# Patient Record
Sex: Female | Born: 1935
Health system: Southern US, Community
[De-identification: ages and names within clinical notes are randomized; demographics above are authoritative.]

## PROBLEM LIST (undated history)

## (undated) DIAGNOSIS — I7300 Raynaud's syndrome without gangrene: Secondary | ICD-10-CM

## (undated) DIAGNOSIS — K219 Gastro-esophageal reflux disease without esophagitis: Secondary | ICD-10-CM

## (undated) DIAGNOSIS — J309 Allergic rhinitis, unspecified: Secondary | ICD-10-CM

## (undated) DIAGNOSIS — N393 Stress incontinence (female) (male): Secondary | ICD-10-CM

## (undated) DIAGNOSIS — M810 Age-related osteoporosis without current pathological fracture: Secondary | ICD-10-CM

## (undated) DIAGNOSIS — N6009 Solitary cyst of unspecified breast: Secondary | ICD-10-CM

## (undated) DIAGNOSIS — E785 Hyperlipidemia, unspecified: Secondary | ICD-10-CM

## (undated) DIAGNOSIS — K648 Other hemorrhoids: Secondary | ICD-10-CM

## (undated) DIAGNOSIS — I00 Rheumatic fever without heart involvement: Secondary | ICD-10-CM

## (undated) DIAGNOSIS — K5730 Diverticulosis of large intestine without perforation or abscess without bleeding: Secondary | ICD-10-CM

## (undated) DIAGNOSIS — K5900 Constipation, unspecified: Secondary | ICD-10-CM

## (undated) HISTORY — DX: Solitary cyst of unspecified breast: N60.09

## (undated) HISTORY — DX: Allergic rhinitis, unspecified: J30.9

## (undated) HISTORY — DX: Hyperlipidemia, unspecified: E78.5

## (undated) HISTORY — PX: TONSILLECTOMY: SHX5217

## (undated) HISTORY — DX: Gastro-esophageal reflux disease without esophagitis: K21.9

## (undated) HISTORY — DX: Raynaud's syndrome without gangrene: I73.00

## (undated) HISTORY — DX: Stress incontinence (female) (male): N39.3

## (undated) HISTORY — PX: TUBAL LIGATION: SHX77

## (undated) HISTORY — PX: SHOULDER SURGERY: SHX246

## (undated) HISTORY — DX: Constipation, unspecified: K59.00

## (undated) HISTORY — DX: Diverticulosis of large intestine without perforation or abscess without bleeding: K57.30

## (undated) HISTORY — PX: BLADDER REPAIR: SHX76

## (undated) HISTORY — PX: TONSILLECTOMY: SUR1361

## (undated) HISTORY — PX: KNEE ARTHROSCOPY: SUR90

## (undated) HISTORY — PX: ABDOMINAL HYSTERECTOMY: SHX81

## (undated) HISTORY — DX: Age-related osteoporosis without current pathological fracture: M81.0

## (undated) HISTORY — DX: Other hemorrhoids: K64.8

## (undated) HISTORY — DX: Rheumatic fever without heart involvement: I00

---

## 1998-03-10 HISTORY — PX: BREAST BIOPSY: SHX20

## 2001-03-10 HISTORY — PX: SHOULDER ARTHROSCOPY WITH ROTATOR CUFF REPAIR AND SUBACROMIAL DECOMPRESSION: SHX5686

## 2001-08-08 HISTORY — PX: SHOULDER ARTHROSCOPY WITH ROTATOR CUFF REPAIR AND SUBACROMIAL DECOMPRESSION: SHX5686

## 2004-03-10 HISTORY — PX: ROTATOR CUFF REPAIR: SHX139

## 2004-05-23 ENCOUNTER — Ambulatory Visit: Payer: Self-pay | Admitting: Family Medicine

## 2004-05-24 ENCOUNTER — Ambulatory Visit (HOSPITAL_COMMUNITY): Admission: RE | Admit: 2004-05-24 | Discharge: 2004-05-25 | Payer: Self-pay | Admitting: Orthopedic Surgery

## 2004-06-20 ENCOUNTER — Encounter: Payer: Self-pay | Admitting: Orthopedic Surgery

## 2004-07-08 ENCOUNTER — Encounter: Payer: Self-pay | Admitting: Orthopedic Surgery

## 2004-08-08 ENCOUNTER — Encounter: Payer: Self-pay | Admitting: Orthopedic Surgery

## 2004-10-08 LAB — HM DEXA SCAN

## 2005-03-27 ENCOUNTER — Ambulatory Visit: Payer: Self-pay | Admitting: Gastroenterology

## 2005-07-02 ENCOUNTER — Ambulatory Visit: Payer: Self-pay | Admitting: Family Medicine

## 2006-02-26 ENCOUNTER — Ambulatory Visit: Payer: Self-pay | Admitting: Family Medicine

## 2006-04-23 ENCOUNTER — Ambulatory Visit: Payer: Self-pay | Admitting: Family Medicine

## 2006-04-23 LAB — CONVERTED CEMR LAB
ALT: 19 units/L (ref 0–40)
AST: 23 units/L (ref 0–37)
Direct LDL: 161.3 mg/dL
Total CHOL/HDL Ratio: 3.4
VLDL: 15 mg/dL (ref 0–40)

## 2006-06-01 ENCOUNTER — Ambulatory Visit: Payer: Self-pay | Admitting: Family Medicine

## 2006-07-07 ENCOUNTER — Ambulatory Visit: Payer: Self-pay | Admitting: Family Medicine

## 2006-07-09 ENCOUNTER — Ambulatory Visit: Payer: Self-pay | Admitting: Family Medicine

## 2006-07-09 DIAGNOSIS — E785 Hyperlipidemia, unspecified: Secondary | ICD-10-CM

## 2006-07-09 DIAGNOSIS — J301 Allergic rhinitis due to pollen: Secondary | ICD-10-CM

## 2006-07-10 ENCOUNTER — Telehealth (INDEPENDENT_AMBULATORY_CARE_PROVIDER_SITE_OTHER): Payer: Self-pay | Admitting: *Deleted

## 2006-07-13 ENCOUNTER — Encounter: Payer: Self-pay | Admitting: Family Medicine

## 2006-07-15 ENCOUNTER — Encounter: Payer: Self-pay | Admitting: Family Medicine

## 2006-07-27 ENCOUNTER — Ambulatory Visit: Payer: Self-pay | Admitting: Family Medicine

## 2006-07-27 LAB — CONVERTED CEMR LAB: Direct LDL: 129.4 mg/dL

## 2006-07-28 ENCOUNTER — Ambulatory Visit: Payer: Self-pay | Admitting: Family Medicine

## 2006-07-30 ENCOUNTER — Telehealth: Payer: Self-pay | Admitting: Family Medicine

## 2006-09-10 ENCOUNTER — Encounter: Payer: Self-pay | Admitting: Family Medicine

## 2006-10-26 ENCOUNTER — Telehealth: Payer: Self-pay | Admitting: Family Medicine

## 2006-10-30 ENCOUNTER — Encounter (INDEPENDENT_AMBULATORY_CARE_PROVIDER_SITE_OTHER): Payer: Self-pay | Admitting: *Deleted

## 2006-12-29 ENCOUNTER — Encounter: Payer: Self-pay | Admitting: Family Medicine

## 2007-01-21 ENCOUNTER — Ambulatory Visit: Payer: Self-pay | Admitting: Family Medicine

## 2007-01-27 ENCOUNTER — Encounter: Payer: Self-pay | Admitting: Family Medicine

## 2007-01-27 ENCOUNTER — Ambulatory Visit: Payer: Self-pay

## 2007-01-28 ENCOUNTER — Ambulatory Visit: Payer: Self-pay | Admitting: Family Medicine

## 2007-01-28 LAB — CONVERTED CEMR LAB
Calcium: 9.5 mg/dL (ref 8.4–10.5)
Creatinine, Ser: 0.7 mg/dL (ref 0.4–1.2)
Glucose, Bld: 71 mg/dL (ref 70–99)
Sodium: 138 meq/L (ref 135–145)

## 2007-01-29 ENCOUNTER — Telehealth: Payer: Self-pay | Admitting: Family Medicine

## 2007-02-11 ENCOUNTER — Ambulatory Visit: Payer: Self-pay | Admitting: Family Medicine

## 2007-02-11 DIAGNOSIS — K219 Gastro-esophageal reflux disease without esophagitis: Secondary | ICD-10-CM

## 2007-02-19 ENCOUNTER — Telehealth: Payer: Self-pay | Admitting: Family Medicine

## 2007-03-25 ENCOUNTER — Telehealth: Payer: Self-pay | Admitting: Family Medicine

## 2007-06-07 ENCOUNTER — Ambulatory Visit: Payer: Self-pay | Admitting: Family Medicine

## 2007-06-07 DIAGNOSIS — K648 Other hemorrhoids: Secondary | ICD-10-CM | POA: Insufficient documentation

## 2007-06-07 DIAGNOSIS — K5909 Other constipation: Secondary | ICD-10-CM

## 2007-06-11 ENCOUNTER — Telehealth: Payer: Self-pay | Admitting: Family Medicine

## 2007-06-17 ENCOUNTER — Other Ambulatory Visit: Admission: RE | Admit: 2007-06-17 | Discharge: 2007-06-17 | Payer: Self-pay | Admitting: Family Medicine

## 2007-06-17 ENCOUNTER — Ambulatory Visit: Payer: Self-pay | Admitting: Family Medicine

## 2007-06-21 LAB — CONVERTED CEMR LAB
AST: 18 units/L (ref 0–37)
Albumin: 4.3 g/dL (ref 3.5–5.2)
Alkaline Phosphatase: 58 units/L (ref 39–117)
BUN: 13 mg/dL (ref 6–23)
Calcium: 9.4 mg/dL (ref 8.4–10.5)
Chloride: 100 meq/L (ref 96–112)
Cholesterol: 238 mg/dL — ABNORMAL HIGH (ref 0–200)
Creatinine, Ser: 0.6 mg/dL (ref 0.40–1.20)
LDL Cholesterol: 152 mg/dL — ABNORMAL HIGH (ref 0–99)
Total CHOL/HDL Ratio: 3.3

## 2007-06-22 ENCOUNTER — Encounter: Payer: Self-pay | Admitting: Family Medicine

## 2007-06-24 ENCOUNTER — Encounter: Payer: Self-pay | Admitting: Family Medicine

## 2007-06-24 DIAGNOSIS — R011 Cardiac murmur, unspecified: Secondary | ICD-10-CM

## 2007-06-24 DIAGNOSIS — I7300 Raynaud's syndrome without gangrene: Secondary | ICD-10-CM

## 2007-06-24 DIAGNOSIS — M810 Age-related osteoporosis without current pathological fracture: Secondary | ICD-10-CM | POA: Insufficient documentation

## 2007-06-24 DIAGNOSIS — B351 Tinea unguium: Secondary | ICD-10-CM | POA: Insufficient documentation

## 2007-06-24 DIAGNOSIS — K5730 Diverticulosis of large intestine without perforation or abscess without bleeding: Secondary | ICD-10-CM | POA: Insufficient documentation

## 2007-07-19 ENCOUNTER — Ambulatory Visit: Payer: Self-pay | Admitting: Gynecology

## 2007-08-12 ENCOUNTER — Encounter: Payer: Self-pay | Admitting: Family Medicine

## 2007-08-17 ENCOUNTER — Encounter: Payer: Self-pay | Admitting: Family Medicine

## 2007-08-18 ENCOUNTER — Ambulatory Visit: Payer: Self-pay | Admitting: Gynecology

## 2007-08-18 ENCOUNTER — Ambulatory Visit (HOSPITAL_COMMUNITY): Admission: RE | Admit: 2007-08-18 | Discharge: 2007-08-19 | Payer: Self-pay | Admitting: Gynecology

## 2007-08-23 ENCOUNTER — Ambulatory Visit: Payer: Self-pay | Admitting: Gynecology

## 2007-09-29 ENCOUNTER — Ambulatory Visit: Payer: Self-pay | Admitting: Gynecology

## 2007-10-06 ENCOUNTER — Encounter: Admission: RE | Admit: 2007-10-06 | Discharge: 2007-10-06 | Payer: Self-pay | Admitting: Gynecology

## 2007-10-08 ENCOUNTER — Encounter: Admission: RE | Admit: 2007-10-08 | Discharge: 2007-10-08 | Payer: Self-pay | Admitting: Gynecology

## 2007-10-14 ENCOUNTER — Encounter: Admission: RE | Admit: 2007-10-14 | Discharge: 2007-10-14 | Payer: Self-pay | Admitting: Gynecology

## 2007-10-21 ENCOUNTER — Encounter: Admission: RE | Admit: 2007-10-21 | Discharge: 2007-10-21 | Payer: Self-pay | Admitting: Gynecology

## 2007-10-21 ENCOUNTER — Encounter (INDEPENDENT_AMBULATORY_CARE_PROVIDER_SITE_OTHER): Payer: Self-pay | Admitting: Diagnostic Radiology

## 2007-12-01 ENCOUNTER — Telehealth: Payer: Self-pay | Admitting: Family Medicine

## 2007-12-01 DIAGNOSIS — I8390 Asymptomatic varicose veins of unspecified lower extremity: Secondary | ICD-10-CM

## 2008-03-23 ENCOUNTER — Telehealth: Payer: Self-pay | Admitting: Family Medicine

## 2008-03-23 DIAGNOSIS — R928 Other abnormal and inconclusive findings on diagnostic imaging of breast: Secondary | ICD-10-CM | POA: Insufficient documentation

## 2008-03-27 ENCOUNTER — Ambulatory Visit: Payer: Self-pay | Admitting: Family Medicine

## 2008-03-29 LAB — CONVERTED CEMR LAB
Alkaline Phosphatase: 51 units/L (ref 39–117)
BUN: 13 mg/dL (ref 6–23)
Basophils Relative: 0.1 % (ref 0.0–3.0)
Bilirubin, Direct: 0.1 mg/dL (ref 0.0–0.3)
CO2: 32 meq/L (ref 19–32)
Calcium: 9.9 mg/dL (ref 8.4–10.5)
Eosinophils Relative: 1.8 % (ref 0.0–5.0)
GFR calc Af Amer: 106 mL/min
GFR calc non Af Amer: 88 mL/min
HCT: 37.4 % (ref 36.0–46.0)
Lymphocytes Relative: 46.5 % — ABNORMAL HIGH (ref 12.0–46.0)
MCV: 89.6 fL (ref 78.0–100.0)
Monocytes Relative: 9.4 % (ref 3.0–12.0)
Neutrophils Relative %: 42.2 % — ABNORMAL LOW (ref 43.0–77.0)
Sodium: 140 meq/L (ref 135–145)
Total Bilirubin: 0.9 mg/dL (ref 0.3–1.2)
WBC: 5.7 10*3/uL (ref 4.5–10.5)

## 2008-04-19 ENCOUNTER — Ambulatory Visit: Payer: Self-pay | Admitting: Family Medicine

## 2008-04-27 ENCOUNTER — Encounter: Admission: RE | Admit: 2008-04-27 | Discharge: 2008-04-27 | Payer: Self-pay | Admitting: Family Medicine

## 2008-05-04 ENCOUNTER — Encounter (INDEPENDENT_AMBULATORY_CARE_PROVIDER_SITE_OTHER): Payer: Self-pay | Admitting: *Deleted

## 2008-05-09 ENCOUNTER — Ambulatory Visit: Payer: Self-pay | Admitting: Family Medicine

## 2008-05-12 ENCOUNTER — Encounter: Payer: Self-pay | Admitting: Family Medicine

## 2008-05-12 ENCOUNTER — Ambulatory Visit: Payer: Self-pay | Admitting: Internal Medicine

## 2008-05-12 ENCOUNTER — Ambulatory Visit: Payer: Self-pay

## 2008-05-16 ENCOUNTER — Telehealth: Payer: Self-pay | Admitting: Family Medicine

## 2008-05-22 ENCOUNTER — Telehealth: Payer: Self-pay | Admitting: Family Medicine

## 2008-06-22 ENCOUNTER — Ambulatory Visit: Payer: Self-pay | Admitting: Family Medicine

## 2008-06-28 ENCOUNTER — Ambulatory Visit: Payer: Self-pay | Admitting: Family Medicine

## 2008-06-28 LAB — CONVERTED CEMR LAB
AST: 22 units/L (ref 0–37)
Albumin: 3.9 g/dL (ref 3.5–5.2)
Bilirubin, Direct: 0.1 mg/dL (ref 0.0–0.3)
Calcium: 9.5 mg/dL (ref 8.4–10.5)
Creatinine, Ser: 0.7 mg/dL (ref 0.4–1.2)
HDL: 81.6 mg/dL (ref >39.00)
Sodium: 139 meq/L (ref 135–145)
Total Bilirubin: 0.7 mg/dL (ref 0.3–1.2)
Total CHOL/HDL Ratio: 3
Total Protein: 6.8 g/dL (ref 6.0–8.3)
Triglycerides: 38 mg/dL (ref 0.0–149.0)

## 2008-08-04 ENCOUNTER — Telehealth: Payer: Self-pay | Admitting: Family Medicine

## 2008-09-05 ENCOUNTER — Telehealth: Payer: Self-pay | Admitting: Family Medicine

## 2008-09-18 ENCOUNTER — Ambulatory Visit: Payer: Self-pay | Admitting: Family Medicine

## 2008-09-18 LAB — CONVERTED CEMR LAB
ALT: 21 units/L (ref 0–35)
AST: 24 units/L (ref 0–37)
Albumin: 3.8 g/dL (ref 3.5–5.2)
BUN: 14 mg/dL (ref 6–23)
Bilirubin, Direct: 0.1 mg/dL (ref 0.0–0.3)
CO2: 30 meq/L (ref 19–32)
Chloride: 104 meq/L (ref 96–112)
Creatinine, Ser: 0.6 mg/dL (ref 0.4–1.2)
HDL: 80.5 mg/dL (ref >39.00)
Potassium: 4.5 meq/L (ref 3.5–5.1)
Total CHOL/HDL Ratio: 2

## 2008-10-06 ENCOUNTER — Encounter: Admission: RE | Admit: 2008-10-06 | Discharge: 2008-10-06 | Payer: Self-pay | Admitting: Family Medicine

## 2008-12-07 ENCOUNTER — Ambulatory Visit: Payer: Self-pay | Admitting: Physician Assistant

## 2008-12-19 ENCOUNTER — Ambulatory Visit: Payer: Self-pay | Admitting: Pain Medicine

## 2008-12-29 ENCOUNTER — Ambulatory Visit: Payer: Self-pay | Admitting: Family Medicine

## 2009-01-09 ENCOUNTER — Ambulatory Visit: Payer: Self-pay | Admitting: Family Medicine

## 2009-01-11 ENCOUNTER — Ambulatory Visit: Payer: Self-pay | Admitting: Physician Assistant

## 2009-01-16 ENCOUNTER — Ambulatory Visit: Payer: Self-pay | Admitting: Family Medicine

## 2009-01-24 ENCOUNTER — Telehealth (INDEPENDENT_AMBULATORY_CARE_PROVIDER_SITE_OTHER): Payer: Self-pay | Admitting: *Deleted

## 2009-01-30 ENCOUNTER — Ambulatory Visit: Payer: Self-pay | Admitting: Pain Medicine

## 2009-02-12 ENCOUNTER — Telehealth: Payer: Self-pay | Admitting: Family Medicine

## 2009-02-15 ENCOUNTER — Ambulatory Visit: Payer: Self-pay | Admitting: Physician Assistant

## 2009-02-21 ENCOUNTER — Telehealth: Payer: Self-pay | Admitting: Internal Medicine

## 2009-03-13 ENCOUNTER — Ambulatory Visit: Payer: Self-pay | Admitting: Pain Medicine

## 2009-03-23 ENCOUNTER — Telehealth (INDEPENDENT_AMBULATORY_CARE_PROVIDER_SITE_OTHER): Payer: Self-pay | Admitting: *Deleted

## 2009-03-26 ENCOUNTER — Ambulatory Visit: Payer: Self-pay | Admitting: Internal Medicine

## 2009-04-02 ENCOUNTER — Ambulatory Visit: Payer: Self-pay | Admitting: Pain Medicine

## 2009-04-03 ENCOUNTER — Encounter: Payer: Self-pay | Admitting: Internal Medicine

## 2009-04-12 ENCOUNTER — Ambulatory Visit: Payer: Self-pay | Admitting: Family Medicine

## 2009-04-23 ENCOUNTER — Ambulatory Visit: Payer: Self-pay | Admitting: Urology

## 2009-04-23 ENCOUNTER — Encounter: Payer: Self-pay | Admitting: Internal Medicine

## 2009-04-24 ENCOUNTER — Ambulatory Visit: Payer: Self-pay | Admitting: Pain Medicine

## 2009-05-17 ENCOUNTER — Encounter: Payer: Self-pay | Admitting: Family Medicine

## 2009-05-18 ENCOUNTER — Encounter: Payer: Self-pay | Admitting: Family Medicine

## 2009-05-18 ENCOUNTER — Ambulatory Visit: Payer: Self-pay

## 2009-05-21 ENCOUNTER — Ambulatory Visit: Payer: Self-pay | Admitting: Pain Medicine

## 2009-07-27 ENCOUNTER — Ambulatory Visit: Payer: Self-pay | Admitting: Internal Medicine

## 2009-07-31 LAB — CONVERTED CEMR LAB
Basophils Absolute: 0 10*3/uL (ref 0.0–0.1)
Basophils Relative: 0.5 % (ref 0.0–3.0)
Calcium: 9.7 mg/dL (ref 8.4–10.5)
Chloride: 100 meq/L (ref 96–112)
Creatinine, Ser: 0.5 mg/dL (ref 0.4–1.2)
Eosinophils Relative: 1.1 % (ref 0.0–5.0)
GFR calc non Af Amer: 145.58 mL/min (ref >60)
Glucose, Bld: 91 mg/dL (ref 70–99)
Lymphocytes Relative: 38.8 % (ref 12.0–46.0)
MCV: 90.8 fL (ref 78.0–100.0)
Monocytes Relative: 8.9 % (ref 3.0–12.0)
Neutro Abs: 3.2 10*3/uL (ref 1.4–7.7)
Neutrophils Relative %: 50.7 % (ref 43.0–77.0)
Platelets: 239 10*3/uL (ref 150.0–400.0)
RBC: 4.34 M/uL (ref 3.87–5.11)
Sodium: 140 meq/L (ref 135–145)
WBC: 6.2 10*3/uL (ref 4.5–10.5)

## 2009-08-13 ENCOUNTER — Encounter: Payer: Self-pay | Admitting: Internal Medicine

## 2009-09-14 ENCOUNTER — Telehealth: Payer: Self-pay | Admitting: Internal Medicine

## 2009-10-08 ENCOUNTER — Encounter: Admission: RE | Admit: 2009-10-08 | Discharge: 2009-10-08 | Payer: Self-pay | Admitting: Internal Medicine

## 2009-10-11 ENCOUNTER — Encounter (INDEPENDENT_AMBULATORY_CARE_PROVIDER_SITE_OTHER): Payer: Self-pay | Admitting: *Deleted

## 2009-11-08 ENCOUNTER — Ambulatory Visit: Payer: Self-pay | Admitting: Family Medicine

## 2009-11-14 ENCOUNTER — Encounter: Payer: Self-pay | Admitting: Family Medicine

## 2010-01-08 ENCOUNTER — Ambulatory Visit: Payer: Self-pay | Admitting: Internal Medicine

## 2010-01-28 ENCOUNTER — Ambulatory Visit: Payer: Self-pay | Admitting: Internal Medicine

## 2010-02-11 ENCOUNTER — Ambulatory Visit: Payer: Self-pay | Admitting: Family Medicine

## 2010-02-11 DIAGNOSIS — M76899 Other specified enthesopathies of unspecified lower limb, excluding foot: Secondary | ICD-10-CM

## 2010-02-11 DIAGNOSIS — M2140 Flat foot [pes planus] (acquired), unspecified foot: Secondary | ICD-10-CM | POA: Insufficient documentation

## 2010-02-11 DIAGNOSIS — M2170 Unequal limb length (acquired), unspecified site: Secondary | ICD-10-CM

## 2010-02-11 DIAGNOSIS — R269 Unspecified abnormalities of gait and mobility: Secondary | ICD-10-CM | POA: Insufficient documentation

## 2010-03-07 ENCOUNTER — Telehealth: Payer: Self-pay | Admitting: Family Medicine

## 2010-04-07 LAB — CONVERTED CEMR LAB
BUN: 12 mg/dL (ref 6–23)
CO2: 30 meq/L (ref 19–32)
Calcium: 9.7 mg/dL (ref 8.4–10.5)
Chloride: 98 meq/L (ref 96–112)
Creatinine, Ser: 0.6 mg/dL (ref 0.4–1.2)
Folate: >20 ng/mL
GFR calc non Af Amer: 105 mL/min

## 2010-04-09 NOTE — Progress Notes (Signed)
Summary: mammogram  Phone Note Call from Patient   Caller: breast [phone]  ext [phone] leave on voice mail Call For: Cindee Salt MD Summary of Call: Patient needs MRI pre-last mammogram record. They need order for this or do you want to wait until patient has mammogram first Initial call taken by: Benny Lennert CMA Duncan Dull),  September 14, 2009 3:24 PM  Follow-up for Phone Call        she has had stable MRI twice so I am not convinced she needs another breast MRI  I would recommend a regular mammogram and then MRI only if abnormality I would also recommend surgeon reevaluation for any abnormalities  Follow-up by: Cindee Salt MD,  September 14, 2009 5:17 PM  Additional Follow-up for Phone Call Additional follow up Details #1::        Spoke to [facility] patient has a screening MMG on 10/08/2009 left message with Olegario Messier what your recommendations were and that you would wait for MMG results. Also called the patient to let her know that you would wait for MMG results from the breast center.  Additional Follow-up by: Carlton Adam,  September 21, 2009 10:58 AM    Additional Follow-up for Phone Call Additional follow up Details #2::    okay will await mammo results Follow-up by: Cindee Salt MD,  September 22, 2009 7:28 PM   Appended Document: mammogram Olegario Messier from [facility] called and wanted to know if Dr. Alphonsus Sias wanted patient to have another MRI.  Advised her of Dr. Karle Starch response on 09/14/2009.  She says she will just contact the patient and if she has any further questions she will have her contact our office.  Appended Document: mammogram noted

## 2010-04-09 NOTE — Letter (Signed)
Summary: Results Follow up Letter  [place] [facility]  [address] [place], [place] [address]   Phone: [phone]  Fax: [phone]    10/11/2009 MRN: [id number]  [facility] [address] [place], [place]  [address]  Dear Ms. Lorio,  The following are the results of your recent test(s):  Test         Result    Pap Smear:        Normal _____  Not Normal _____ Comments: ______________________________________________________ Cholesterol: LDL(Bad cholesterol):         Your goal is less than:         HDL (Good cholesterol):       Your goal is more than: Comments:  ______________________________________________________ Mammogram:        Normal __X___  Not Normal _____ Comments: Will repeat in 1-2 years  ___________________________________________________________________ Hemoccult:        Normal _____  Not normal _______ Comments:    _____________________________________________________________________ Other Tests:    We routinely do not discuss normal results over the telephone.  If you desire a copy of the results, or you have any questions about this information we can discuss them at your next office visit.   Sincerely,     Tillman Abide, MD

## 2010-04-09 NOTE — Assessment & Plan Note (Signed)
Summary: ROA FOR 6 MONTH FOLLOW-UP/JRR   Vital Signs:  Patient profile:   75 year old female Weight:      154 pounds Temp:     98.0 degrees F oral Pulse rate:   72 / minute Pulse rhythm:   regular BP sitting:   120 / 80  (left arm) Cuff size:   regular  Vitals Entered By: Mervin Hack CMA Duncan Dull) (January 28, 2010 10:12 AM) CC: 6 month follow-up   History of Present Illness: Still has "lumps" in throat Voice was different at first--now back to her normal Feels phlegm in there and not going away  Swallowing difficulty has resolved now still feels phlegm Only occ heartburn issues  Not sure if she might have post nasal drip Did get immunotherapy in [place] but no recent meds Now here 6 years  Still gets cramps in the arch of her foot sometimes has to get out of car after getting in  [place] a times--wears sneakers. Depends on the weather  She feels her left leg is "very heavy" has to stop some exercises due to pain along lateral left leg---like leg lifts when lying down  Allergies: No Known Drug Allergies  Past History:  Past medical, surgical, family and social histories (including risk factors) reviewed for relevance to current acute and chronic problems.  Past Medical History: Reviewed history from 07/27/2009 and no changes required. RAYNAUD'S DISEASE (ICD-443.0) DERMATOPHYTOSIS OF NAIL (ICD-110.1) CARDIAC MURMUR (ICD-785.2) OSTEOPOROSIS (ICD-733.00) DIVERTICULOSIS, COLON (ICD-562.10) URINARY INCONTINENCE, STRESS, FEMALE (ICD-625.6) HEMORRHOIDS, INTERNAL (ICD-455.0) CONSTIPATION (ICD-564.00) GERD (ICD-530.81) HYPERLIPIDEMIA (ICD-272.4) ALLERGIC RHINITIS (ICD-477.9)    Past Surgical History: Reviewed history from 06/24/2007 and no changes required. In the 1960s, bladder tuck. In 1973, hysterectomy, full.  Tonsillectomy.  In 1990s, breast biopsy for fibrocystic disease.  In 2006, bilaterally shoulder surgery for rotator cuff.  Colonoscopy,  normal.  She states that she thinks she had it in     2006.  01/2003  Stress Test (-) 10/2004    DEXA - Osteopenia on Actonel, improving  Family History: Reviewed history from 06/24/2007 and no changes required. Father: Died 63 COPD, melanoma Mother: Died 18, CAD, CABG, MI age 20, melanoma, high cholesterol, DM Siblings: 2 brothers (diverticulosis, DM)                   3 sisters (polio, diverticulosis, brain aneurysm CVA:  PGM NO MI < 26 Breast CA:  Sister and aunt  Social History: Reviewed history from 07/27/2009 and no changes required. Never Smoked Alcohol use-yes, rare, maybe one every 3 months Drug use-no Regular exercise-yes, walking daily, line dancing 3 x/week Marital Status: Married  Children: 4, one died at 64  - MI vs. CVA Occupation: Retired Airline pilot person Diet:  No fruit, (+) veggies, no FF  Has living will. Husband should make health care decisions as needed, then son Molly Maduro. Would accept resuscitation but no prolonged ventilation. Would not want feeding tube if not cognitively aware  Review of Systems       still happy with the med for urinary incontinence Is still troubled with constipation---does try prune juice appetite is okay weight fairly stable Tosses and turns at night---dryingess in throat  Physical Exam  General:  alert and normal appearance.   Ears:  R ear normal and L ear normal.   Nose:  mild pale congestion without sig inflammation Mouth:  no erythema and no exudates.   Neck:  supple, no masses, no thyromegaly, and no cervical lymphadenopathy.  Lungs:  normal respiratory effort, no intercostal retractions, no accessory muscle use, normal breath sounds, no crackles, and no wheezes.   Heart:  normal rate, regular rhythm, no murmur, and no gallop.   Msk:  fairly normal arch on right----arch is gone on left Extremities:  no edema No calf or thigh tenderness Neurologic:  strength normal in all extremities and gait normal.   Psych:  normally  interactive, good eye contact, not anxious appearing, and not depressed appearing.     Impression & Recommendations:  Problem # 1:  GERD (ICD-530.81) Assessment Improved no swallowing problems now continue pantoprazole  Her updated medication list for this problem includes:    Pantoprazole Sodium 40 Mg Tbec (Pantoprazole sodium) .Marland Kitchen... 1 by mouth daily  Problem # 2:  ALLERGIC RHINITIS (ICD-477.9) Assessment: Deteriorated may be causing her sense of drainage  now will instruct on OTC meds to try  Problem # 3:  URINARY INCONTINENCE, STRESS, FEMALE (ICD-625.6) Assessment: Unchanged happy with the vesicare but explained that this can cause the dry mouth  Problem # 4:  HYPERLIPIDEMIA (ICD-272.4) Assessment: Unchanged no problems with med  Her updated medication list for this problem includes:    Simvastatin 40 Mg Tabs (Simvastatin) .Marland Kitchen... 1 tab by mouth daily  Labs Reviewed: SGOT: 24 (09/18/2008)   SGPT: 21 (09/18/2008)   HDL:80.50 (09/18/2008), 81.60 (06/22/2008)  LDL:92 (09/18/2008), 152 (45/40/9811)  Chol:183 (09/18/2008), 269 (06/22/2008)  Trig:51.0 (09/18/2008), 38.0 (06/22/2008)  Complete Medication List: 1)  Vesicare 5 Mg Tabs (Solifenacin succinate) .... Take 1 by mouth once daily 2)  Simvastatin 40 Mg Tabs (Simvastatin) .Marland Kitchen.. 1 tab by mouth daily 3)  Pantoprazole Sodium 40 Mg Tbec (Pantoprazole sodium) .Marland Kitchen.. 1 by mouth daily 4)  Caltrate 600+d Plus 600-400 Mg-unit Tabs (Calcium carbonate-vit d-min) .... Take 2 tablet by mouth twice a day 5)  Odorless Garlic 1250 Mg Tabs (Garlic) .Marland Kitchen.. 1 by mouth daily 6)  Fish Oil Maximum Strength 1200 Mg Caps (Omega-3 fatty acids) .... 2 by mouth daily 7)  Tgt Aspirin 81 Mg Tbec (Aspirin) .... Take 1 tablet by mouth once a day 8)  Icaps Caps (Multiple vitamins-minerals) .... Take 2 by mouth once daily  Patient Instructions: 1)  Please set up appt with Dr Patsy Lager to evaluate asymmetric arches and chronic foot spasms 2)  Please try miralax  -- 1 capful daily with water---for constipation 3)  Please try loratadine 10mg  1-2 daily or cetirizine 10mg  daily for allergies and drainage 4)  Please schedule a follow-up appointment in 6 months for physical    Orders Added: 1)  Est. Patient Level IV [91478]    Current Allergies (reviewed today): No known allergies

## 2010-04-09 NOTE — Miscellaneous (Signed)
Summary: Orders Update  Clinical Lists Changes  Orders: Added new Test order of Carotid Duplex (Carotid Duplex) - Signed 

## 2010-04-09 NOTE — Assessment & Plan Note (Signed)
Summary: 5 M F/U DLO   Vital Signs:  Patient profile:   75 year old female Weight:      153 pounds Temp:     98.3 degrees F oral Pulse rate:   72 / minute Pulse rhythm:   regular BP sitting:   140 / 70  (left arm) Cuff size:   regular  Vitals Entered By: Mervin Hack CMA Duncan Dull) (Jul 27, 2009 12:26 PM) CC: follow-up visit   History of Present Illness: Here for physical  reveiwed preventative testing and immunizations  Trouble by sharp cramps on bottom of feet can occur any time Walks regularly  occ sharp pain along left back --to flank and then chest notices yesterday affected using arm some better today no rash  No cough  no fever  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  Past Medical History: RAYNAUD'S DISEASE (ICD-443.0) DERMATOPHYTOSIS OF NAIL (ICD-110.1) CARDIAC MURMUR (ICD-785.2) OSTEOPOROSIS (ICD-733.00) DIVERTICULOSIS, COLON (ICD-562.10) URINARY INCONTINENCE, STRESS, FEMALE (ICD-625.6) HEMORRHOIDS, INTERNAL (ICD-455.0) CONSTIPATION (ICD-564.00) GERD (ICD-530.81) HYPERLIPIDEMIA (ICD-272.4) ALLERGIC RHINITIS (ICD-477.9)    Past Surgical History: Reviewed history from 06/24/2007 and no changes required. In the 1960s, bladder tuck. In 1973, hysterectomy, full.  Tonsillectomy.  In 1990s, breast biopsy for fibrocystic disease.  In 2006, bilaterally shoulder surgery for rotator cuff.  Colonoscopy, normal.  She states that she thinks she had it in     2006.  01/2003  Stress Test (-) 10/2004    DEXA - Osteopenia on Actonel, improving  Family History: Reviewed history from 06/24/2007 and no changes required. Father: Died 64 COPD, melanoma Mother: Died 82, CAD, CABG, MI age 34, melanoma, high cholesterol, DM Siblings: 2 brothers (diverticulosis,  DM)                   3 sisters (polio, diverticulosis, brain aneurysm CVA:  PGM NO MI < 48 Breast CA:  Sister and aunt  Social History: Never Smoked Alcohol use-yes, rare, maybe one every 3 months Drug use-no Regular exercise-yes, walking daily, line dancing 3 x/week Marital Status: Married  Children: 4, one died at 44  - MI vs. CVA Occupation: Retired Airline pilot person Diet:  No fruit, (+) veggies, no FF  Has living will. Husband should make health care decisions as needed, then son Molly Maduro. Would accept resuscitation but no prolonged ventilation. Would not want feeding tube if not cognitively aware  Review of Systems General:  Denies sleep disorder; weight is stable wears seat belt. Eyes:  Denies double vision and vision loss-1 eye. ENT:  Denies decreased hearing and ringing in ears; teeth okay--regular with dentist has partial on bottom. CV:  Complains of chest pain or discomfort and lightheadness; denies difficulty breathing at night, difficulty breathing while lying down, fainting, palpitations, shortness of breath with exertion, and swelling of feet; occ jittery and lightheaded --relates to low blood sugar. Resp:  Denies cough and shortness of breath. GI:  Complains of constipation and indigestion; denies abdominal pain, bloody stools, dark tarry stools, nausea, and vomiting; regular heartburn--uses tums a couple of times a week--helps all bran keeps her regular. GU:  Complains of incontinence; denies dysuria; no sexual problems. MS:  Denies joint pain and joint swelling. Derm:  Complains of lesion(s); denies rash; has a few scattered spots on skin. Neuro:  Denies headaches, numbness, tingling, and weakness.  Psych:  Denies anxiety and depression. Heme:  Denies abnormal bruising and enlarge lymph nodes. Allergy:  Complains of seasonal allergies and sneezing; occ uses OTC meds.  Physical Exam  General:  alert and normal appearance.   Eyes:  pupils equal, pupils round, and pupils  reactive to light.   Ears:  R ear normal and L ear normal.   Mouth:  no erythema, no exudates, and no lesions.   Neck:  supple, no masses, no thyromegaly, no carotid bruits, and no cervical lymphadenopathy.   Breasts:  no masses, no abnormal thickening, no tenderness, and no adenopathy.  Still quite dense Lungs:  normal respiratory effort and normal breath sounds.   Heart:  normal rate, regular rhythm, and no gallop.   ??very faint systolic murmur at base Abdomen:  soft and non-tender.   Msk:  no joint tenderness and no joint swelling.   Pulses:  1+ in feet Extremities:  no edema Neurologic:  alert & oriented X3, strength normal in all extremities, and gait normal.   Skin:  benign keratoses on back---marked redness on back from scratching benign nevi on legs Inguinal Nodes:  No significant adenopathy Psych:  normally interactive, good eye contact, not anxious appearing, and not depressed appearing.     Impression & Recommendations:  Problem # 1:  PREVENTIVE HEALTH CARE (ICD-V70.0) Assessment Comment Only doing generally well due for mammo in July  Problem # 2:  OSTEOPOROSIS (ICD-733.00) Assessment: Unchanged  on fosamax >5 years will check labs stop alendronate  DEXA for new baseline  The following medications were removed from the medication list:    Alendronate Sodium 70 Mg Tabs (Alendronate sodium) .Marland Kitchen... 1 tab by mouth qweek Her updated medication list for this problem includes:    Caltrate 600+d Plus 600-400 Mg-unit Tabs (Calcium carbonate-vit d-min) .Marland Kitchen... Take 1 tablet by mouth twice a day  Orders: TLB-Renal Function Panel (80069-RENAL) TLB-CBC Platelet - w/Differential (85025-CBCD) TLB-TSH (Thyroid Stimulating Hormone) (84443-TSH) Venipuncture ([address]) Radiology Referral (Radiology)  Problem # 3:  ONYCHOLYSIS (ICD-703.8) Assessment: Improved used tree oil   Problem # 4:  GERD (ICD-530.81) Assessment: Unchanged okay on meds  Her updated medication list for  this problem includes:    Prilosec 20 Mg Cpdr (Omeprazole) .Marland Kitchen... Take 1 tablet by mouth every morning  Complete Medication List: 1)  Caltrate 600+d Plus 600-400 Mg-unit Tabs (Calcium carbonate-vit d-min) .... Take 1 tablet by mouth twice a day 2)  Odorless Garlic 1250 Mg Tabs (Garlic) .Marland Kitchen.. 1 by mouth daily 3)  Fish Oil Maximum Strength 1200 Mg Caps (Omega-3 fatty acids) .Marland Kitchen.. 1 by mouth daily 4)  Multivitamins Tabs (Multiple vitamin) .Marland Kitchen.. 1 by mouth daily 5)  Tgt Aspirin 81 Mg Tbec (Aspirin) .... Take 1 tablet by mouth once a day 6)  Prilosec 20 Mg Cpdr (Omeprazole) .... Take 1 tablet by mouth every morning 7)  Simvastatin 40 Mg Tabs (Simvastatin) .Marland Kitchen.. 1 tab by mouth daily  Patient Instructions: 1)  Please schedule a follow-up appointment in 6 months .   Current Allergies (reviewed today): No known allergies    Immunization History:  Tetanus/Td Immunization History:    Tetanus/Td:  Td (03/10/2004)  Influenza Immunization History:    Influenza:  Fluvax 3+ (01/09/2009)  Pneumovax Immunization History:    Pneumovax:  Pneumovax (03/10/2005)  Zostavax History:    Zostavax # 1:  Zostavax (03/11/2007)

## 2010-04-09 NOTE — Assessment & Plan Note (Signed)
Summary: cough/ds   Vital Signs:  Patient profile:   75 year old female Height:      63.5 inches Weight:      151 pounds BMI:     26.42 Temp:     97.9 degrees F oral Pulse rate:   72 / minute Pulse rhythm:   regular BP sitting:   122 / 66  (left arm) Cuff size:   regular  Vitals Entered By: Delilah Shan CMA Duncan Dull) (April 12, 2009 8:22 AM) CC: Cough   History of Present Illness: 75 yo female here for cough.  Had URI symptoms last week, runny nose, sneezing. Those symptoms resolved but now has this lingering cough. Feels like she needs to cough something up but cannot. No wheezing or shortness of breath. No fevers. No sore throat or ear pain.   Current Medications (verified): 1)  Caltrate 600+d Plus 600-400 Mg-Unit Tabs (Calcium Carbonate-Vit D-Min) .... Take 1 Tablet By Mouth Twice A Day 2)  Odorless Garlic 1250 Mg Tabs (Garlic) .Marland Kitchen.. 1 By Mouth Daily 3)  Fish Oil Maximum Strength 1200 Mg Caps (Omega-3 Fatty Acids) .Marland Kitchen.. 1 By Mouth Daily 4)  Multivitamins  Tabs (Multiple Vitamin) .Marland Kitchen.. 1 By Mouth Daily 5)  Tgt Aspirin 81 Mg Tbec (Aspirin) .... Take 1 Tablet By Mouth Once A Day 6)  Alendronate Sodium 70 Mg  Tabs (Alendronate Sodium) .Marland Kitchen.. 1 Tab By Mouth Qweek 7)  Vitamin C 500 Mg  Tabs (Ascorbic Acid) .... Take 1 Tablet By Mouth Once A Day 8)  Prilosec 20 Mg Cpdr (Omeprazole) .... Take 1 Tablet By Mouth Every Morning 9)  Simvastatin 40 Mg Tabs (Simvastatin) .Marland Kitchen.. 1 Tab By Mouth Daily 10)  Gabapentin 100 Mg Caps (Gabapentin) .... ? Mg.  Allergies (verified): No Known Drug Allergies  Review of Systems      See HPI General:  Denies chills and fever. ENT:  Denies earache, nasal congestion, ringing in ears, sinus pressure, and sore throat. Resp:  Complains of cough; denies shortness of breath and wheezing.  Physical Exam  General:  alert.  NAD VS reviewed- afebrile and normotensive Ears:  External ear exam shows no significant lesions or deformities.  Otoscopic  examination reveals clear canals, tympanic membranes are intact bilaterally without bulging, retraction, inflammation or discharge. Hearing is grossly normal bilaterally. Nose:  External nasal examination shows no deformity or inflammation. Nasal mucosa are pink and moist without lesions or exudates. Mouth:  Oral mucosa and oropharynx without lesions or exudates.  Teeth in good repair. Lungs:  Normal respiratory effort, chest expands symmetrically. Lungs are clear to auscultation, no crackles or wheezes. Heart:  Normal rate and regular rhythm. S1 and S2 normal without gallop, murmur, click, rub or other extra sounds. Extremities:  no edmea Psych:  normally interactive, good eye contact, not anxious appearing, and not depressed appearing.     Impression & Recommendations:  Problem # 1:  URI (ICD-465.9) Assessment New Symptoms resolving.  Linger cough with normal lung exam.  Advised Mucinex to help break up mucous.  Pt to call if no improvement in 3-5 days. Her updated medication list for this problem includes:    Tgt Aspirin 81 Mg Tbec (Aspirin) .Marland Kitchen... Take 1 tablet by mouth once a day  Complete Medication List: 1)  Caltrate 600+d Plus 600-400 Mg-unit Tabs (Calcium carbonate-vit d-min) .... Take 1 tablet by mouth twice a day 2)  Odorless Garlic 1250 Mg Tabs (Garlic) .Marland Kitchen.. 1 by mouth daily 3)  Fish Oil Maximum Strength 1200  Mg Caps (Omega-3 fatty acids) .Marland Kitchen.. 1 by mouth daily 4)  Multivitamins Tabs (Multiple vitamin) .Marland Kitchen.. 1 by mouth daily 5)  Tgt Aspirin 81 Mg Tbec (Aspirin) .... Take 1 tablet by mouth once a day 6)  Alendronate Sodium 70 Mg Tabs (Alendronate sodium) .Marland Kitchen.. 1 tab by mouth qweek 7)  Vitamin C 500 Mg Tabs (Ascorbic acid) .... Take 1 tablet by mouth once a day 8)  Prilosec 20 Mg Cpdr (Omeprazole) .... Take 1 tablet by mouth every morning 9)  Simvastatin 40 Mg Tabs (Simvastatin) .Marland Kitchen.. 1 tab by mouth daily 10)  Gabapentin 100 Mg Caps (Gabapentin) .... ? mg.  Current Allergies  (reviewed today): No known allergies

## 2010-04-09 NOTE — Progress Notes (Signed)
Summary: Heartburn  Phone Note Call from Patient Call back at [phone] OR [phone] CELL   Caller: Patient Call For: Dr. Alphonsus Sias Summary of Call: Pt has had worsening heart burn for one week. Pt said Omeprazole is not helping now. Pt has also been chewing Tums. Pt has been eating spicy foods and adding hot sauce to food. Advised pt not to eat greasy or spicy foods until heart burn is resolved. Pt made appt to see you on Monday, 03/26/09 at 11:30am. Also advised pt if worsened condition could make appt to be seen at [facility] or if at night to go to urgent  care or ER.  Pt said to let you know how she was feeling and if she did not hear back she would keep appt on Mon. Pt uses [facility] [address] as pharmacy [phone]. Please advise.  Initial call taken by: Lewanda Rife LPN,  March 23, 2009 11:39 AM  Follow-up for Phone Call        Please let her know I sent Rx for another acid blocker to try till Monday's appt she should take it two times a day --before breakfast and 2nd one before dinner or at bedtime. Important that these meds be taken on an empty stomach  If the Rx is too expensive, she can try doubling the omeprazole and taking 40mg  --and take that two times a day also Follow-up by: Cindee Salt MD,  March 23, 2009 1:24 PM    New/Updated Medications: NEXIUM 40 MG CPDR (ESOMEPRAZOLE MAGNESIUM) 1 tab two times a day as directed Prescriptions: NEXIUM 40 MG CPDR (ESOMEPRAZOLE MAGNESIUM) 1 tab two times a day as directed  #60 x 0   Entered and Authorized by:   Cindee Salt MD   Signed by:   Cindee Salt MD on 03/23/2009   Method used:   Electronically to        [facility]  [address] (retail)       [address]       [place], [place]  [address]       Ph: [phone]       Fax: [phone]   RxID:   [id number]

## 2010-04-09 NOTE — Assessment & Plan Note (Signed)
Summary: FLU SHOT/LETVAK/CLE   Nurse Visit   Allergies: No Known Drug Allergies  Orders Added: 1)  Flu Vaccine 59yrs + MEDICARE PATIENTS [Q2039] 2)  Administration Flu vaccine - MCR [G0008]  Flu Vaccine Consent Questions     Do you have a history of severe allergic reactions to this vaccine? no    Any prior history of allergic reactions to egg and/or gelatin? no    Do you have a sensitivity to the preservative Thimersol? no    Do you have a past history of Guillan-Barre Syndrome? no    Do you currently have an acute febrile illness? no    Have you ever had a severe reaction to latex? no    Vaccine information given and explained to patient? yes    Are you currently pregnant? no    Lot Number:[id number]   Exp Date:09/07/2010   Site Given  Left Deltoid IM

## 2010-04-09 NOTE — Letter (Signed)
Summary: [facility]   Imported By: Lanelle Bal 04/25/2009 13:06:10  _____________________________________________________________________  External Attachment:    Type:   Image     Comment:   External Document  Appended Document: [facility] cysto and ultrasound normal trying vesicare

## 2010-04-09 NOTE — Assessment & Plan Note (Signed)
Summary: 30 min appt evaluate asymmetic arches and chronic foot spasms...   Vital Signs:  Patient profile:   75 year old female Height:      63.5 inches Weight:      156.8 pounds BMI:     27.44 Temp:     98.1 degrees F oral Pulse rate:   72 / minute Pulse rhythm:   regular BP sitting:   110 / 72  (left arm) Cuff size:   regular  Vitals Entered By: Benny Lennert CMA Duncan Dull) (February 11, 2010 10:56 AM)   History of Present Illness: Dr. Alphonsus Sias is asked for a consult for  the patient for bilateral hip pain and foot pain.  The patient has a history of bilateral hip pain laterally it is going down the sides of her legs. She initially saw Dr. Gerrit Heck from [facility] orthopedics. She has actually had 3 greater trochanteric bursa injections in the past year. She also had a series of 3 epidural steroid injections in the spine coming from the pain Center.  I don't have any of these records. She also has had MRI of the lumbar spine without any cervical changes, but I do not have any records or know the exact specifics of this imaging.  history of remote traumatic fracture of the right lower extremity, and had to wear a cast for 6 weeks as a child, and has some paresthesias and tingling in around that right lower extremity and foot. She reportedly was told that she did have some fusion of the bone in that area. She also had a history of rheumatic fever that affected her foot in some way, and now chills has a leg length discrepancy, right leg shorter than the left.  The patient also has some difficulty with man-made materials in her shoes, and generally prefers to wear scatters Brand shoes. She also has one other shoes but is comfortable that she has in her back diffuse and some other female shoes that are uncomfortable. She is unable to tolerate wearing heels  No PT. 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  Past Medical History: Reviewed history from 07/27/2009 and no changes required. RAYNAUD'S DISEASE (ICD-443.0) DERMATOPHYTOSIS OF NAIL (ICD-110.1) CARDIAC MURMUR (ICD-785.2) OSTEOPOROSIS (ICD-733.00) DIVERTICULOSIS, COLON (ICD-562.10) URINARY INCONTINENCE, STRESS, FEMALE (ICD-625.6) HEMORRHOIDS, INTERNAL (ICD-455.0) CONSTIPATION (ICD-564.00) GERD (ICD-530.81) HYPERLIPIDEMIA (ICD-272.4) ALLERGIC RHINITIS (ICD-477.9)    Past Surgical History: Reviewed history from 06/24/2007 and no changes required. In the 1960s, bladder tuck. In 1973, hysterectomy, full.  Tonsillectomy.  In 1990s, breast biopsy for fibrocystic disease.  In 2006, bilaterally shoulder surgery for rotator cuff.  Colonoscopy, normal.  She states that she thinks she had it in     2006.  01/2003  Stress Test (-) 10/2004    DEXA - Osteopenia on Actonel, improving  Family History: Reviewed history from 06/24/2007 and no changes required. Father: Died 53 COPD, melanoma Mother: Died 19, CAD, CABG, MI age 69, melanoma, high cholesterol, DM Siblings: 2 brothers (diverticulosis, DM)                   3 sisters (polio, diverticulosis, brain aneurysm CVA:  PGM NO MI < 43 Breast CA:  Sister and aunt  Social History: Reviewed history from 07/27/2009 and no changes required. Never Smoked Alcohol  use-yes, rare, maybe one every 3 months Drug use-no Regular exercise-yes, walking daily, line dancing 3 x/week Marital Status: Married  Children: 4, one died at 53  - MI vs. CVA Occupation: Retired Airline pilot person Diet:  No fruit, (+) veggies, no FF  Has living will. Husband should make health care decisions as needed, then son Molly Maduro. Would accept resuscitation but no prolonged ventilation. Would not want feeding tube if not cognitively aware  Review of Systems       REVIEW OF SYSTEMS  GEN: No  systemic complaints, no fevers, chills, sweats, or other acute illnesses MSK: Detailed in the HPI GI: tolerating PO intake without difficulty Neuro: as above Otherwise, the pertinent positives and negatives are listed above and in the HPI, otherwise a full review of systems has been reviewed and is negative unless noted positive.   Physical Exam  General:  Well-developed,well-nourished,in no acute distress; alert,appropriate and cooperative throughout examination Head:  Normocephalic and atraumatic without obvious abnormalities. No apparent alopecia or balding. Ears:  no external deformities.   Nose:  no external deformity.   Neck:  No deformities, masses, or tenderness noted. Lungs:  normal respiratory effort.   Msk:  HIP EXAM: SIDE: B ROM: Abduction, Flexion, Internal and External range of motion: Mild loss of ROM Pain with terminal IROM and EROM: no GTB:  B TTP SLR: NEG Knees: No effusion FABER: NT REVERSE FABER: + Piriformis: NT at direct palpation Str: flexion: 4/5 abduction: 5/5 adduction: 3+, R > L Strength testing non-tender  Extremities:  No clubbing, cyanosis, edema, or deformity noted with normal full range of motion of all joints.   Neurologic:  alert & oriented X3.   Skin:  Intact without suspicious lesions or rashes Cervical Nodes:  No lymphadenopathy noted Psych:  Cognition and judgment appear intact. Alert and cooperative with normal attention span and concentration. No apparent delusions, illusions, hallucinations   Impression & Recommendations:  Problem # 1:  ABNORMALITY OF GAIT (ICD-781.2) Recommendations:  >45 minutes spent in total face to face time with the patient with >50% of time spent in counselling and coordination of care: I spent a great deal of time, significant and more than 50% of this encounter and counseling. We discussed the relevant anatomy involved, gait disturbance, her prior rheumatic fever, prior traumatic injury, prior casting, and how  this may have impacted her gait alteration. She has severe weakness in hip abduction and flexion and likely is walking in a significant Trendelenburg gait and this will always result in significant bursitis in my opinion. I recommended she go to physical therapy for gait training and strengthening.  We discussed hip strengthening in significant detail.  Also recommended bilateral greater trochanteric bursa injections to assist with ease of pain prior to going to physical therapy and onset of rehabilitation. also recommended right-sided also correction for leg length discrepancy versus custom creation of semirigid orthotic with a right-sided leg length correction. At this point, the patient declined all these recommendations.  She is willing to proceed with his therapy and home exercise program.  cc: Dr. Alphonsus Sias   Orders: Physical Therapy Referral (PT)  Problem # 2:  TROCHANTERIC BURSITIS, BILATERAL (ICD-726.5)  Orders: Physical Therapy Referral (PT)  Complete Medication List: 1)  Vesicare 5 Mg Tabs (Solifenacin succinate) .... Take 1 by mouth once daily 2)  Simvastatin 40 Mg Tabs (Simvastatin) .Marland Kitchen.. 1 tab by mouth daily 3)  Pantoprazole Sodium 40 Mg Tbec (Pantoprazole sodium) .Marland Kitchen.. 1 by mouth daily 4)  Caltrate 600+d Plus  600-400 Mg-unit Tabs (Calcium carbonate-vit d-min) .... Take 2 tablet by mouth twice a day 5)  Odorless Garlic 1250 Mg Tabs (Garlic) .Marland Kitchen.. 1 by mouth daily 6)  Fish Oil Maximum Strength 1200 Mg Caps (Omega-3 fatty acids) .... 2 by mouth daily 7)  Tgt Aspirin 81 Mg Tbec (Aspirin) .... Take 1 tablet by mouth once a day 8)  Icaps Caps (Multiple vitamins-minerals) .... Take 2 by mouth once daily  Patient Instructions: 1)  Referral Appointment Information 2)  Day/Date: 3)  Time: 4)  Place/MD: 5)  Address: 6)  Phone/Fax: 7)  Patient given appointment information. Information/Orders faxed/mailed.  8)  CALL ME IF YOU DECIDE YOU WANT TO PROCEED WITH ORTHOTICS OR OUTSOLE  MODIFICATION FOR LEG LENGTH CORRECTION.   Orders Added: 1)  Physical Therapy Referral [PT] 2)  Consultation Level IV [16109]    Current Allergies (reviewed today): No known allergies

## 2010-04-09 NOTE — Letter (Signed)
Summary: [facility]   Imported By: Lanelle Bal 04/07/2009 09:09:03  _____________________________________________________________________  External Attachment:    Type:   Image     Comment:   External Document  Appended Document: [facility] trying enablex and vesicare Plans ultrasound and cysto

## 2010-04-09 NOTE — Assessment & Plan Note (Signed)
Summary: lump in throat that is bothering her/alc   Vital Signs:  Patient profile:   75 year old female Height:      63.5 inches Weight:      156.2 pounds BMI:     27.33 Temp:     97.8 degrees F oral Pulse rate:   72 / minute Pulse rhythm:   regular BP sitting:   110 / 70  (left arm) Cuff size:   regular  Vitals Entered By: Benny Lennert CMA Duncan Dull) (November 08, 2009 8:12 AM)  History of Present Illness: Chief complaint lump in throat  75 year old female:  ? lump in throat. allergies.   7 year from mass feels when swallowing   75 year old white female who presents with a several week history of some dysphasia, and what feels like a lump in her throat when he swallows in her lower throat. She is able to drink perfectly fine, and is able to eat perfectly fine, however she did have a sensation that she had some difficulty swallowing a pill a week or so ago.  Now she does feel like she has something in her lower throat that she can feel a baseline.  GERD: Also, patient has severe GERD symptoms, and an ongoing chronic for years. Right now she is taking Prilosec daily, without much significant improvement in her symptoms. She has never tried b.i.d. Prilosec or any other PPIs.   She denies any significant allergy symptoms ongoing. She denies dyspnea on exertion.  Allergies (verified): No Known Drug Allergies  Past History:  Past medical, surgical, family and social histories (including risk factors) reviewed, and no changes noted (except as noted below).  Past Medical History: Reviewed history from 07/27/2009 and no changes required. RAYNAUD'S DISEASE (ICD-443.0) DERMATOPHYTOSIS OF NAIL (ICD-110.1) CARDIAC MURMUR (ICD-785.2) OSTEOPOROSIS (ICD-733.00) DIVERTICULOSIS, COLON (ICD-562.10) URINARY INCONTINENCE, STRESS, FEMALE (ICD-625.6) HEMORRHOIDS, INTERNAL (ICD-455.0) CONSTIPATION (ICD-564.00) GERD (ICD-530.81) HYPERLIPIDEMIA (ICD-272.4) ALLERGIC RHINITIS  (ICD-477.9)    Past Surgical History: Reviewed history from 06/24/2007 and no changes required. In the 1960s, bladder tuck. In 1973, hysterectomy, full.  Tonsillectomy.  In 1990s, breast biopsy for fibrocystic disease.  In 2006, bilaterally shoulder surgery for rotator cuff.  Colonoscopy, normal.  She states that she thinks she had it in     2006.  01/2003  Stress Test (-) 10/2004    DEXA - Osteopenia on Actonel, improving  Family History: Reviewed history from 06/24/2007 and no changes required. Father: Died 27 COPD, melanoma Mother: Died 3, CAD, CABG, MI age 63, melanoma, high cholesterol, DM Siblings: 2 brothers (diverticulosis, DM)                   3 sisters (polio, diverticulosis, brain aneurysm CVA:  PGM NO MI < 78 Breast CA:  Sister and aunt  Social History: Reviewed history from 07/27/2009 and no changes required. Never Smoked Alcohol use-yes, rare, maybe one every 3 months Drug use-no Regular exercise-yes, walking daily, line dancing 3 x/week Marital Status: Married  Children: 4, one died at 75  - MI vs. CVA Occupation: Retired Airline pilot person Diet:  No fruit, (+) veggies, no FF  Has living will. Husband should make health care decisions as needed, then son Molly Maduro. Would accept resuscitation but no prolonged ventilation. Would not want feeding tube if not cognitively aware  Review of Systems      See HPI General:  Denies chills and fever. GI:  Complains of indigestion and nausea; denies vomiting.  Physical Exam  General:  Well-developed,well-nourished,in no acute distress; alert,appropriate and cooperative throughout examination Head:  Normocephalic and atraumatic without obvious abnormalities. No apparent alopecia or balding. Ears:  no external deformities.   Nose:  no external deformity.   Neck:  No deformities, masses, or tenderness noted. Lungs:  Normal respiratory effort, chest expands symmetrically. Lungs are clear to auscultation, no crackles or  wheezes. Heart:  Normal rate and regular rhythm. S1 and S2 normal without gallop, murmur, click, rub or other extra sounds. Abdomen:  Bowel sounds positive,abdomen soft and non-tender without masses, organomegaly or hernias noted. Extremities:  No clubbing, cyanosis, edema, or deformity noted with normal full range of motion of all joints.   Neurologic:  alert & oriented X3 and gait normal.   Psych:  Cognition and judgment appear intact. Alert and cooperative with normal attention span and concentration. No apparent delusions, illusions, hallucinations   Impression & Recommendations:  Problem # 1:  GERD (ICD-530.81) Assessment Deteriorated severe GERD, will change medications, reviewed symptomatic care. I be this a trial 2-3 weeks, and if her dysphagia sore symptoms do not improve, I think that it would be reasonable to have her followup with gastroenterology. At that time, I would think about endoscopic evaluation.  Her updated medication list for this problem includes:    Pantoprazole Sodium 40 Mg Tbec (Pantoprazole sodium) .Marland Kitchen... 1 by mouth daily  Diagnostics Reviewed:  Discussed lifestyle modifications, diet, antacids/medications, and preventive measures.   Problem # 2:  DYSPHAGIA PHARYNGOESOPHAGEAL PHASE (ZOX-096.04) Assessment: New  Complete Medication List: 1)  Caltrate 600+d Plus 600-400 Mg-unit Tabs (Calcium carbonate-vit d-min) .... Take 1 tablet by mouth twice a day 2)  Odorless Garlic 1250 Mg Tabs (Garlic) .Marland Kitchen.. 1 by mouth daily 3)  Fish Oil Maximum Strength 1200 Mg Caps (Omega-3 fatty acids) .Marland Kitchen.. 1 by mouth daily 4)  Multivitamins Tabs (Multiple vitamin) .Marland Kitchen.. 1 by mouth daily 5)  Tgt Aspirin 81 Mg Tbec (Aspirin) .... Take 1 tablet by mouth once a day 6)  Pantoprazole Sodium 40 Mg Tbec (Pantoprazole sodium) .Marland Kitchen.. 1 by mouth daily 7)  Simvastatin 40 Mg Tabs (Simvastatin) .Marland Kitchen.. 1 tab by mouth daily Prescriptions: PANTOPRAZOLE SODIUM 40 MG TBEC (PANTOPRAZOLE SODIUM) 1 by mouth  daily  #30 x 5   Entered and Authorized by:   Hannah Beat MD   Signed by:   Hannah Beat MD on 11/08/2009   Method used:   Electronically to        [facility]  [address] (retail)       [address], [address]       [place], [place]  [address]       Ph: [phone]       Fax: [phone]   RxID:   [id number]   Current Allergies (reviewed today): No known allergies

## 2010-04-09 NOTE — Assessment & Plan Note (Signed)
Summary: INDIGESTION WORSE FOR ONE WK/RI   Vital Signs:  Patient profile:   75 year old female Weight:      150 pounds Temp:     98.6 degrees F oral BP sitting:   110 / 68  (left arm) Cuff size:   regular  Vitals Entered By: Linde Gillis CMA Duncan Dull) (March 26, 2009 11:41 AM)  History of Present Illness: Has been on prolosec for some time Went off for a while and seemed to do well Would use tums as needed only And used prilosec rarely (like once a month)  Tried some diet tea once and noted it "ruined me" constant bowel movements "My rectum turned inside out" rectum is settling down but stomach also an issue  Back to prilosec daily still having some "roaring" but it is better eating okay  Allergies (verified): No Known Drug Allergies  Past History:  Past medical, surgical, family and social histories (including risk factors) reviewed for relevance to current acute and chronic problems.  Past Medical History: Reviewed history from 06/24/2007 and no changes required.  Current Problems:  RAYNAUD'S DISEASE (ICD-443.0) DERMATOPHYTOSIS OF NAIL (ICD-110.1) CARDIAC MURMUR (ICD-785.2) OSTEOPOROSIS (ICD-733.00) DIVERTICULOSIS, COLON (ICD-562.10) VAGINAL MASS (ICD-625.8) URINARY INCONTINENCE, STRESS, FEMALE (ICD-625.6) OTHER SCREENING MAMMOGRAM (ICD-V76.12) HEMORRHOIDS, INTERNAL (ICD-455.0) CONSTIPATION (ICD-564.00) GERD (ICD-530.81) HYPERPOTASSEMIA (ICD-276.7) DYSPNEA ON EXERTION (ICD-786.09) CHEST PAIN UNSPECIFIED (ICD-786.50) NUMBNESS, ARM (ICD-782.0) SPRAIN/STRAIN, SACROILIAC SITE NOS (ICD-846.9) HYPERLIPIDEMIA (ICD-272.4) ALLERGIC RHINITIS (ICD-477.9)    Past Surgical History: Reviewed history from 06/24/2007 and no changes required. In the 1960s, bladder tuck. In 1973, hysterectomy, full.  Tonsillectomy.  In 1990s, breast biopsy for fibrocystic disease.  In 2006, bilaterally shoulder surgery for rotator cuff.  Colonoscopy, normal.  She states that she thinks  she had it in     2006.  01/2003  Stress Test (-) 10/2004    DEXA - Osteopenia on Actonel, improving  Family History: Reviewed history from 06/24/2007 and no changes required. Father: Died 66 COPD, melanoma Mother: Died 28, CAD, CABG, MI age 73, melanoma, high cholesterol, DM Siblings: 2 brothers (diverticulosis, DM)                   3 sisters (polio, diverticulosis, brain aneurysm CVA:  PGM NO MI < 78 Breast CA:  Sister and aunt  Social History: Reviewed history from 06/24/2007 and no changes required. Never Smoked Alcohol use-yes, rare, maybe one every 3 months Drug use-no Regular exercise-yes, walking daily, line dancing 3 x/week Marital Status: Married x 48 years Children: 4, one died at 65  - MI vs. CVA Occupation: Retired Airline pilot person Diet:  No fruit, (+) veggies, no FF  Review of Systems       feels she has lost weight (but stable on measurements) No fevers  Physical Exam  General:  alert.  NAD Abdomen:  soft, no distention, and no masses.   Very slight epigastric sensitivity Rectal:  external hemorrhoids with mild inflammation No prolapse Genitalia:    Psych:  normally interactive, good eye contact, not anxious appearing, and not depressed appearing.     Impression & Recommendations:  Problem # 1:  ABDOMINAL PAIN, EPIGASTRIC (ICD-789.06) Assessment New seemed to be more a reaction to the diet tea than exacerbation of GERD will increase prilosec to two times a day till symptoms improve, then gradually wean off  Complete Medication List: 1)  Caltrate 600+d Plus 600-400 Mg-unit Tabs (Calcium carbonate-vit d-min) .... Take 1 tablet by mouth twice a day 2)  Odorless Garlic 1250  Mg Tabs (Garlic) .Marland Kitchen.. 1 by mouth daily 3)  Fish Oil Maximum Strength 1200 Mg Caps (Omega-3 fatty acids) .Marland Kitchen.. 1 by mouth daily 4)  Multivitamins Tabs (Multiple vitamin) .Marland Kitchen.. 1 by mouth daily 5)  Tgt Aspirin 81 Mg Tbec (Aspirin) .... Take 1 tablet by mouth once a day 6)  Alendronate  Sodium 70 Mg Tabs (Alendronate sodium) .Marland Kitchen.. 1 tab by mouth qweek 7)  Vitamin C 500 Mg Tabs (Ascorbic acid) .... Take 1 tablet by mouth once a day 8)  Prilosec 20 Mg Cpdr (Omeprazole) .... Take 1 tablet by mouth every morning 9)  Simvastatin 40 Mg Tabs (Simvastatin) .Marland Kitchen.. 1 tab by mouth daily 10)  Gabapentin 100 Mg Caps (Gabapentin) .... ? mg.  Patient Instructions: 1)  Please try the prilosec two times a day for the next week or so (before breakfast and before supper or bedtime). If stomach settles down, can then try to wean off again 2)  Please schedule a follow-up appointment in 4-6  months .   Current Allergies (reviewed today): No known allergies

## 2010-04-11 NOTE — Progress Notes (Signed)
  Phone Note Call from Patient   Caller: Patient Call For: [facility] Summary of Call: Patient called and said she saw you recently and you asked for her records from her Ortho doctor Ruthann Cancer of [facility].  You told her she need to fill out a medical release form.  So the patient went to Dr. Golda Acre office and filled out a medical release form to release records to [facility].  So it's on file with them.  The patient said all you have to do is request the records you desire.  She supplied the fax number as [phone].   Initial call taken by: Carin Primrose,  March 07, 2010 3:12 PM  Follow-up for Phone Call        please place order and fax to them:  Request all records, Jones Broom, [facility] patient has released to our office, signed release at [facility]. Hannah Beat MD  March 07, 2010 3:46 PM  Follow-up by: Hannah Beat MD,  March 07, 2010 3:46 PM  Additional Follow-up for Phone Call Additional follow up Details #1::        request for records faxed to [facility] clinic  on 03-08-2010 at 8:36 Additional Follow-up by: Benny Lennert CMA Duncan Dull),  March 08, 2010 8:36 AM

## 2010-04-11 NOTE — Letter (Signed)
Summary: Records from [facility] 2010 - 2011  Records from [facility] 2010 - 2011   Imported By: Maryln Gottron 03/19/2010 11:18:42  _____________________________________________________________________  External Attachment:    Type:   Image     Comment:   External Document

## 2010-04-17 ENCOUNTER — Ambulatory Visit: Payer: Self-pay | Admitting: Gastroenterology

## 2010-04-17 ENCOUNTER — Encounter: Payer: Self-pay | Admitting: Internal Medicine

## 2010-04-17 LAB — HM COLONOSCOPY

## 2010-05-07 NOTE — Procedures (Signed)
Summary: Upper GI Endoscopy  Upper GI Endoscopy   Imported By: Kassie Mends 04/30/2010 08:01:47  _____________________________________________________________________  External Attachment:    Type:   Image     Comment:   External Document  Appended Document: Upper GI Endoscopy     Clinical Lists Changes  Observations: Added new observation of EGD: Grade A reflux esophagitis Otherwise normal Dr Bluford Kaufmann (04/17/2010 8:03)       EGD  Procedure date:  04/17/2010  Findings:      Grade A reflux esophagitis Otherwise normal Dr Bluford Kaufmann

## 2010-05-07 NOTE — Procedures (Signed)
Summary: colonoscopy  colonoscopy   Imported By: Kassie Mends 04/30/2010 08:03:53  _____________________________________________________________________  External Attachment:    Type:   Image     Comment:   External Document  Appended Document: colonoscopy     Clinical Lists Changes  Observations: Added new observation of COLONOSCOPY: Results: Hemorrhoids. and : Diverticulosis.       Dr Bluford Kaufmann (04/17/2010 14:28)       Colonoscopy  Procedure date:  04/17/2010  Findings:      Results: Hemorrhoids. and : Diverticulosis.       Dr Bluford Kaufmann

## 2010-07-23 NOTE — Discharge Summary (Signed)
NAME:  Sheryl Miller, Sheryl Miller            ACCOUNT NO.:  [id number]   MEDICAL RECORD NO.:  [id number]          PATIENT TYPE:  OIB   LOCATION:  9304                          FACILITY:  [facility]   PHYSICIAN:  Ginger Carne, MD  DATE OF BIRTH:  04-09-1935   DATE OF ADMISSION:  08/18/2007  DATE OF DISCHARGE:  08/19/2007                               DISCHARGE SUMMARY   REASON FOR HOSPITALIZATION:  Genuine urinary stress incontinence and  second-degree cystocele.   IN-HOSPITAL PROCEDURES:  Tension free vaginal tape procedure with  cystoscopy and anterior colporrhaphy.   FINAL DIAGNOSIS:  Genuine urinary stress incontinence and second-degree  cystocele.   HOSPITAL COURSE:  This is a 75 year old multiparous Caucasian female who  was referred by Dr. Ermalene Searing because of genuine urinary stress  incontinence and second-degree cystocele.  The patient underwent the  aforementioned procedure on August 18, 2007.  Her postoperative course was  unremarkable.  The patient had a soft abdomen.  Incisions were dry with  minimal tenderness.  The patient had a voided volume of 125 mL and  residual urine volume of approximately 200 mL.  Foley catheter was  replaced with a leg bag, and the patient will return on August 23, 2007,  to have the apparatus removed.  She was instructed after removal of  Foley to have time voids every 4 hours for the next 3-4 weeks, to avoid  heavy lifting, and to contact the office for temperature elevation above  100.4 degrees Fahrenheit, increasing abdominal or incisional pain,  vaginal bleeding, or any other concerns.  She was also instructed that  when her Foley catheter is removed, if she has symptoms of significant  urgency and/or urinary retention to contact the office as well.  The  patient was discharged on Cipro 500 mg 1 twice a day for 7 days and also  Vicodin 1-2 every 4-6 hours as needed for pain.  The patient also was  asked to continue Actonel 70 mg once a day, baby aspirin  daily, and over-  the-counter herbal medications which the patient takes.  All questions  answered to the satisfaction of the patient. The patient verbalized  understanding of the same.      Ginger Carne, MD  Electronically Signed     SHB/MEDQ  D:  08/19/2007  T:  08/20/2007  Job:  [id number]

## 2010-07-23 NOTE — Op Note (Signed)
NAME:  Sheryl Miller, Sheryl Miller            ACCOUNT NO.:  [id number]   MEDICAL RECORD NO.:  [id number]          PATIENT TYPE:  OIB   LOCATION:  9304                          FACILITY:  [facility]   PHYSICIAN:  Ginger Carne, MD  DATE OF BIRTH:  [birth year]   DATE OF PROCEDURE:  08/18/2007  DATE OF DISCHARGE:                               OPERATIVE REPORT   PREOPERATIVE DIAGNOSES:  1. Genuine urinary stress incontinence.  2. Second-degree cystocele.   POSTOPERATIVE DIAGNOSES:  1. Genuine urinary stress incontinence.  2. Second-degree cystocele.   PROCEDURE:  Tension-free vaginal tape procedure with cystoscopy and  anterior colporrhaphy.   SURGEON:  Ginger Carne, MD   ASSISTANT:  None.   COMPLICATIONS:  None immediate.   ESTIMATED BLOOD LOSS:  Less than 50 mL.   ANESTHESIA:  General.   SPECIMEN:  None.   OPERATIVE FINDINGS:  External genitalia, vulva, and vagina revealed  evidence of a previous hysterectomy with an intact cuff.  In addition,  the patient had a second-degree cystocele, no rectocele.   OPERATIVE PROCEDURE:  The patient was prepped and draped in usual  fashion and placed in lithotomy position.  Betadine solution was used  for antiseptic, and the patient was catheterized prior to the procedure.  After adequate general anesthesia, self-retaining retractors using the  [employer] system was utilized.  An anterior vaginal wall midline  incision was made approximately 1-2 cm from the external urethral meatus  up to approximately 2 cm from the scar the vaginal cuff.  Pubovesical  cervical fascia was then dissected off the vaginal epithelium.  The  space of Retzius was then identified.  Using a [employer]-  up [employer],  placement of the trocars was carried out, hugging the  posterior aspect of the pubic bone and emanating from the skin 1-2 cm  lateral to the symphysis pubis.   Immediate cystoscopy was performed.  No injury to urethra, trigone,  lateral  bladder walls, dome, or posterior wall.  At this point, the  cystoscope was removed and the bladder was emptied.  A standard anterior  colporrhaphy using 0 Vicryl figure-of-eight sutures was utilized.  Care  and attention was paid not to overtighten the pubovesical cervical  fascia approximation near the midurethral region.  The TVT was then  appropriately tensioned.  The plastic sheaths were removed and the  remainder of the tape was cut beneath the skin.  The skin was then  closed with Dermabond.  Copious irrigation throughout the procedure and at the end followed.  No  active bleeding noted.  After trimming the edges of the vaginal  epithelium, it was closed with 0 Monocryl running interlocking suture.  The patient tolerated the procedure well and returned to postanesthesia  recovery room in excellent condition.  No active bleeding noted.      Ginger Carne, MD  Electronically Signed     SHB/MEDQ  D:  08/18/2007  T:  08/18/2007  Job:  [id number]

## 2010-07-23 NOTE — Assessment & Plan Note (Signed)
NAME:  Sheryl Miller, Sheryl Miller            ACCOUNT NO.:  [id number]   MEDICAL RECORD NO.:  [id number]          PATIENT TYPE:  POB   LOCATION:  [facility]         FACILITY:  [facility]   PHYSICIAN:  Ginger Carne, MD DATE OF BIRTH:  [birth year]   DATE OF SERVICE:  07/19/2007                                  CLINIC NOTE   REASON FOR CONSULTATION:  Urinary stress incontinence.   HISTORY OF PRESENT ILLNESS:  This patient is a 75 year old gravida 4,  para 4-0-0-4 Caucasian female referred by Dr. Ermalene Searing because of urinary  loss with straining and other Valsalva maneuvers.  The patient enjoys  dancing and has to wear a pad when she performs physical activities.  Years ago, the patient states she had a bladder tack procedure which, by  her history and all accounts, was done through a small abdominal  incision and was probably a Wayne Both procedure.  Since  then, the patient did well until about 10-12 years ago where she began  losing urine with coughing and straining.  She denies symptoms of an  overactive bladder.  She denies nocturia, postvoid dribbling, fecal  incontinence.  She takes no medications to enhance her loss of urine and  has no chronic illnesses that would contribute to her urinary loss.   OB/GYN HISTORY:  The patient has had four vaginal deliveries in the  past.   PAST SURGICAL PROCEDURE:  The patient had a Wayne Both  procedure approximately 30-40 years ago.  She also had a total vaginal  hysterectomy and 2 years later followed by a abdominal bilateral  salpingo-oophorectomy.  She has had multiple breast biopsies and one  aspiration, all reported to be benign by the patient.  She has had  bilateral rotator cuff repairs in the past as well.  Medical history  includes arthritis and hypercholesterolemia.   ALLERGIES:  None.   CURRENT MEDICATIONS:  Generic Actonel one daily, one baby aspirin daily,  and over-the-counter supplements.   SOCIAL HISTORY:  Patient is a nonsmoker.  Denies alcohol or illicit drug  or abuse.   FAMILY HISTORY:  Her mother and father both had skin melanomas.  Mother  has had type 2 diabetes and myocardial infarction.  Her brothers had  died type 2 diabetes and myocardial infarction.  She has one sister who  was diagnosed with breast cancer in her 85s.   REVIEW OF SYSTEMS:  The patient denies chest pain on exertion.  Is a  class I [place] Cardiac Classification.   PHYSICAL EXAMINATION:  Blood pressure is 123/68, weight 159 pounds,  height 5 feet 3 inches, pulse 68 and regular.  HEENT:  Grossly normal.  Breast exam without masses, discharge, thickenings or tenderness.  CHEST:  Clear to percussion and auscultation.  Cardiovascular exam without murmurs or enlargements, regular rate and  rhythm.  Extremity, lymphatic, skin, neurological, musculoskeletal systems within  normal limits.  ABDOMEN:  Soft without gross hepatosplenomegaly.  PELVIC EXAM:  External genitalia, vulva and vagina reveals a second-  degree cystocele, first-degree rectocele, small vaginal tissue anterior  vaginal wall skin tag at the distal third approximately 10 o'clock  position about 8-10 mm.  Vaginal cuff  is well-supported.  No evidence of  cervical remnant noted.  Bimanual exam reveals no adnexal masses or  tenderness.  RECTAL:  Rectovaginal examination is confirmatory for above.   Residual urine volume is 6 mL.  Urinalysis is normal.   IMPRESSION:  Genuine urinary stress incontinence and second-degree  cystocele.   PLAN:  The patient wishes to have correction of her incontinence due to  her active lifestyle and the negative consequences it has particularly  on dancing.  The patient would not be a good candidate for a pessary due  to her activity, and therefore was offered and will be scheduled for a  tension-free vaginal tape procedure with cystoscopy.  Urodynamic testing  is not necessary.  Ashby Dawes of said  procedure discussed in detail.  Also a  cystocele repair will be performed.  Risks including possible injuries  to ureter and bladder, hemorrhage, bleeding, a postoperative infection  and possible graft rejection, erosion or infection were discussed and  understood by the patient.  The patient understands that these  complications are very rare and would be unlikely to occur.  She  understands that she may have urgency immediately postoperatively for  the first 2 to 3 months and may require a leg bag with a Foley catheter  for up to 1 week postoperatively.  The patient accepts said risks and  verbalizes understanding of nature of procedure, and will be scheduled  for same.           ______________________________  Ginger Carne, MD     SHB/MEDQ  D:  07/19/2007  T:  07/19/2007  Job:  [id number]   cc:   Dr. Tamera Punt [facility]

## 2010-07-23 NOTE — Assessment & Plan Note (Signed)
NAME:  Sheryl Miller, Sheryl Miller            ACCOUNT NO.:  [id number]   MEDICAL RECORD NO.:  [id number]          PATIENT TYPE:  POB   LOCATION:  [facility]         FACILITY:  [facility]   PHYSICIAN:  Ginger Carne, MD DATE OF BIRTH:  08/07/1935   DATE OF SERVICE:  09/29/2007                                  CLINIC NOTE   The patient is here today for postoperative evaluation after having a  TVT procedure for genitourinary stress incontinence and an anterior  colporrhaphy because of a second-degree cystocele on August 18, 2007.  Apart from a scant vaginal drainage, the patient has no complaints.  She  is able to hold her urine and has no complaints of incontinence or  urgency.  Her pelvic exam demonstrates well-healed anterior compartment  without tenderness and the incision feels intact.   The patient is here today also because of a 2-3 months history of sharp  right breast pain throughout the breast.  She states that she has had 7  biopsies, all benign in the past.  Of note, she has 2 sisters and a  daughter with breast cancer.  One of her sisters had said diagnosis at  age of 36, another sister she is uncertain as to her menopausal status.  The daughter was 73 when she was diagnosed with breast cancer.  One of  her sisters was told she had a BRAC 1 or 2 gene to be positive in  [place].   PHYSICAL EXAMINATION:  Both breasts reveals no masses, discharge,  thickenings or tenderness.  Axillary nodes are negative.  Supraclavicular nodes are negative as well.   PLAN:  I have arranged to have BRAC 1 and 2 testing done and we will  have this aligned with her sisters' findings.  In addition, a diagnostic  mammogram of the right breast and MRI has been ordered as well.  The  patient will be asked to return after said results are available.           ______________________________  Ginger Carne, MD     SHB/MEDQ  D:  09/29/2007  T:  09/30/2007  Job:  [id number]

## 2010-07-26 NOTE — Op Note (Signed)
NAME:  DEJANIRA, PAMINTUAN            ACCOUNT NO.:  [id number]   MEDICAL RECORD NO.:  [id number]          PATIENT TYPE:  OIB   LOCATION:  NA                           FACILITY:  [facility]   PHYSICIAN:  Burnard Bunting, M.D.    DATE OF BIRTH:  22-Jan-1936   DATE OF PROCEDURE:  05/24/2004  DATE OF DISCHARGE:                                 OPERATIVE REPORT   PREOPERATIVE DIAGNOSIS:  Right shoulder rotator cuff tear and bursitis.   POSTOPERATIVE DIAGNOSIS:  Right shoulder rotator cuff tear and bursitis.   OPERATION PERFORMED:  Right shoulder diagnostic arthroscopy, subacromial  decompression, mini open rotator cuff repair, intra-articular debridement of  synovitis with decompression of cyst.   SURGEON:  Burnard Bunting, M.D.   ASSISTANT:  Jerolyn Shin. Tresa Res, M.D.   ANESTHESIA:  General endotracheal.   ESTIMATED BLOOD LOSS:  25 cc.   DRAINS:  None.   SPECIMENS:  None.   OPERATIVE FINDINGS:  Examination under anesthesia, range of motion external  rotation 15 degrees, abduction is 50, forward flexion is 160 __________  around 90.   DESCRIPTION OF PROCEDURE:  The patient was brought to the operating room  where general endotracheal anesthesia was induced.  The patient was placed  in supine position with the head in neutral flexion and extension.  Right  arm was manipulated into full flexion and full abduction and full external  rotation.  Right arm and shoulder was prepped with DuraPrep solution and  draped in sterile manner.  Topographic anatomy __________ lateral margin of  the acromion as well as the coracoid process.  Solution of saline with  epinephrine was injected into the subacromial space.  The scope was placed  into the glenohumeral joint through a posterolateral portal 2 cm medial and  inferior to the posterolateral margin of the acromion.  Diagnostic  arthroscopy was performed.  Intra-articular synovitis was present,  consistent with early frozen shoulder which was also  consistent with her  examination under anesthesia.  This extensive red synovitis was debrided.  Rotator interval was released.  Biceps anchor was stable.  Rotator cuff tear  was identified, the bleeding edge of the supraspinatus.  Debridement was  facilitated through placement of an anterior portal under direct  visualization into the rotator interval.  Following intra-articular  debridement, the glenohumeral articular surfaces were inspected and found to  be intact.  The anterior inferior glenohumeral ligament, posterior inferior  glenohumeral ligament were intact.  The edge of the rotator cuff was  debrided from both the articular and bursal side.  At this time the scope  was placed in the subacromial space.  Lateral portal was created.  Subacromial decompression was performed with bursectomy and debridement and  release but not resection of the CA ligament.  At this time Collier Flowers was used  to cover the operative field.  Lateral portal incision was extended. The 1.5  x 1.5 cm rotator cuff tear was then mobilized after the deltoid was split a  measured distance of 3 cm.  Stay suture was placed in the apex of the  deltoid split.  The rotator cuff tear  was mobilized and repaired using a  combination of push-off suture anchors and bioabsorbable screws.  Watertight  repair was achieved. __________ in the infraspinatus was then palpated and  decompressed.  It tracked from its attachment to the humeral head  proximally.  Following decompression of the cyst, the incision was  thoroughly irrigated and closed using a #1 Vicryl suture, reapproximated the  deltoid split interrupted inverted 2-0 Vicryl suture and 3-0 Prolene to  reapproximate the mini open incision.  Two anterior and posterior portals  were closed using 3-0 nylon.  Patient was then placed in a bulky dressing.  The patient tolerated the procedure well without any immediate  complications.      GSD/MEDQ  D:  07/25/2004  T:  07/25/2004   Job:  [id number]

## 2010-07-26 NOTE — Assessment & Plan Note (Signed)
[facility]                           [facility] OFFICE NOTE   Sheryl Miller, Sheryl Miller                     MRN:          [id number]  DATE:02/26/2006                            DOB:          January 03, 1936    CHIEF COMPLAINT:  A 75 year old white female here to establish new  doctor.   HISTORY OF PRESENT ILLNESS:  Sheryl Miller comes to clinic with the  following concerns:  1. Fainting spells, chronic, intermittent:  She thinks that she first      started having these occur 4 years ago.  She states that they have      occurred when her father passed away, at times of family stress, as      well as following a bowel movement twice.  She states that on      December 8th she began feeling lightheaded, had some blurred      vision, and suddenly felt like she had to use the bathroom.  She      went and had a bowel movement, and upon arising, she lost      consciousness.  She is unsure how long she was out, but she did      have family nearby who stated they thought it was only a few      seconds because she called before she passed out.  There was a      nurse who was present, and they checked her blood pressure and it      was found to be low, although they are unsure what the number was.      She states that other than the blurred vision, she had no headache,      no palpation, no other sign that this was going to happen.  She is      unsure of her water intake that day.  She does not eat regularly,      and does not think she ate regularly that day.  She states that      sometimes when she misses meals, she feels shaky and woozy, but      then improves after eating.  She denies any seizure activity.  2. Osteoporosis, chronic:  She states that she is unsure as to when      this was last evaluated, but it may have been many years ago.  She      currently is on Actonel 35 mg weekly.  She denies any side effects,      but does have some problems with reflux on  days when she is not      taking the medication.  3. Right ear itchiness:  She states that for many years she has had      some dry skin and itchiness in her right ear, off and on.  She      denies any history of infection or pain.  She does use her finger      to scratch at her ears, as well as a Q-tip to clean out wax.  4. Neck pain, acute:  She states that  she woke yesterday morning with      stiff neck and unable to move her head from side to side because of      pain in the left side of her neck.  She denies numbness, tingling,      new incontinence, or lower extremity weakness.   REVIEW OF SYSTEMS:  No headache.  She wears glasses.  No new hearing  problems, no dyspnea, no chest pain, no palpitations, no cough, no  nausea, vomiting, diarrhea.  Occasional constipation, no rectal  bleeding.   PAST MEDICAL HISTORY:  1. Allergies.  2. Heart murmur secondary to rheumatic fever.  3. High cholesterol.  4. Urinary incontinence.  5. Fungal toenail infection.  6. Osteoporosis.   HOSPITALIZATION/SURGERIES/PROCEDURES:  1. In the 1960s, bladder tuck.  2. In 1973, hysterectomy, full.  3. Tonsillectomy.  4. In 1990s, breast biopsy for fibrocystic disease.  5. DEXA.  6. In 2006, bilaterally shoulder surgery for rotator cuff.  7. Colonoscopy, normal.  She states that she thinks she had it in      2006.   ALLERGIES:  NONE.   MEDICATIONS:  1. Caltrate 600 mg with vitamin D 2 tablets daily.  2. Aspirin 81 mg daily.  3. Garlic 50 mg 1 p.o. daily.  4. Terbinafine 250 mg 1 p.o. daily.  5. Actonel 25 mg p.o. weekly.  6. Fish oil 1200 mg 2 tablets p.o. b.i.d.  7. Multivitamin daily.   FAMILY HISTORY:  Father deceased at age 15 with chronic obstructive  pulmonary disease and melanoma.  Mother deceased at age 52, with first  heart attack at age 95 status post coronary artery bypass graft, high  cholesterol, diabetes and melanoma.  Paternal grandfather with stroke.  She has 2 brothers; 1  with diverticulosis, the other with diabetes.  She  has 3 sisters; 1 who has polio, 1 who has diverticulosis, and 1 who had  brain aneurysm.  There is no heart attack before age 56 in the family.  She has a sister and an aunt with breast cancer.  There is no other  family history of any other type of cancer.   SOCIAL HISTORY:  She does not smoke.  She uses alcohol rarely, every 3  months.  She has not used IV drugs in the past.  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  She has been married for 48 years without  domestic abuse.  She has 4 children, 1 who passed away from a heart  attack versus a stroke at age 81.  She walks daily and line dances 3  times a week.  She does not eat fruits but tries to eat vegetables.  She  does not eat fast food.   PHYSICAL EXAMINATION:  VITAL SIGNS:  Height 63 1/2 inches.  Weight 153.4  pounds.  Blood pressure 112/70, pulse 80, temperature 97.8.  GENERAL:  Elderly-appearing female in no apparent distress.  HEENT:  PERRLA.  Extraocular muscles intact.  Oropharynx clear.  Tympanic membranes clear.  No cerumen in bilateral external ear.  There  is some dry, flaky, irritated skin in right external ear.  No tenderness  when pulling on pinna.  No thyromegaly, no lymphadenopathy,  supraclavicular or cervical.  No carotid bruits heard.  CARDIOVASCULAR:  Regular rate and rhythm.  No murmurs, rubs, or gallops.  Normal PMI.  2+ peripheral pulses.  LUNGS:  Clear to auscultation bilaterally.  No wheezes, rales, or  rhonchi.  ABDOMEN:  Soft, nontender.  Normoactive bowel  sounds.  No  hepatosplenomegaly.  MUSCULOSKELETAL:  Tenderness to palpation over left sternocleidomastoid,  decreased range of motion in lateral turning to the left and right  secondary to neck pain.  Normal flexion but decreased extension  secondary to left neck pain.  Negative Spurling.  Strength 5/5 in upper  and lower extremities. NEURO:  Cranial nerves II-XII are grossly intact.  Normal  sensation in  upper and lower extremities.  Reflexes patellar 2+.  Alert and oriented  x 3.   PROCEDURES:  EKG, bradycardia with ventricular rate 56 beats per minute.  No ST changes, no Q-waves, no QRS widening.  Some poor R-wave  progression.   ASSESSMENT/PLAN:  1. Syncopal episodes:  Given her description of the episodes, they do      sound most likely vasovagal.  She was given information on this      topic.  She also sounds like she has some history of possible      hypoglycemic episodes.  She was instructed to eat 5 small meals a      day, or 3 meals with snacks in between, never going more than 3-5      hours without eating.  In addition, her electrocardiogram showed      some bradycardia.  There could be a possibility of tachy brady      syndrome.  I will obtain previous records to see if any previous      electrocardiograms were done.  If she continues to have problems,      we can always consider further cardiac workups.  There is no      suggestion of seizure activity, and no preceding heart palpitations      or abnormalities that suggest focal cardiac origin.  She had no      symptoms suggesting stroke or TIA, as well as no evidence of      carotid bruit.  2. Osteoporosis, chronic:  Continue Actonel.  She very likely needs a      repeat DEXA scan to determine if this improving her osteoporosis.  3. Right chronic external otitis.  She has mild dry skin and      flakiness, as well as irritation in her right ear.  She was given a      course of 7 days of acetic acid hydrocortisone to decrease itch.      She may use this off and on if the symptoms recur, but never for an      extended period of time.  She was also given information on the      correct way to clean her ears without putting anything into her ear      canal.  She was also recommended to use some mineral oil in the      future to decrease irritated ear canal.  4. Left sternocleidomastoid muscle strain.  She was  given information      on stretching exercises.  She will use heat and ice as needed.  She      was also given a prescription for 15 tablets of Flexeril 5 mg to      use at bedtime p.r.n.  5. Prevention:  She is likely due for mammogram.  She is unsure of the      date of her colonoscopy as well as her last vaccine, so I will have      to verify this with records once they arrive. She is also likely  due for a cholesterol panel, but is unsure.  See above section for      DEXA.     Kerby Nora, MD  Electronically Signed    AB/MedQ  DD: 02/26/2006  DT: 02/26/2006  Job #: [id number]

## 2010-07-30 ENCOUNTER — Encounter: Payer: Self-pay | Admitting: Internal Medicine

## 2010-08-09 ENCOUNTER — Other Ambulatory Visit: Payer: Self-pay | Admitting: *Deleted

## 2010-08-09 MED ORDER — SIMVASTATIN 40 MG PO TABS: 40.0000 mg | ORAL_TABLET | Freq: Every day | ORAL | Status: DC

## 2010-08-15 ENCOUNTER — Encounter: Payer: Self-pay | Admitting: Internal Medicine

## 2010-08-16 ENCOUNTER — Ambulatory Visit (INDEPENDENT_AMBULATORY_CARE_PROVIDER_SITE_OTHER): Payer: Medicare PPO | Admitting: Internal Medicine

## 2010-08-16 ENCOUNTER — Encounter: Payer: Self-pay | Admitting: Internal Medicine

## 2010-08-16 VITALS — BP 110/68 | HR 60 | Temp 97.7°F | Ht 63.5 in | Wt 160.0 lb

## 2010-08-16 DIAGNOSIS — E785 Hyperlipidemia, unspecified: Secondary | ICD-10-CM

## 2010-08-16 DIAGNOSIS — N393 Stress incontinence (female) (male): Secondary | ICD-10-CM

## 2010-08-16 DIAGNOSIS — Z0000 Encounter for general adult medical examination without abnormal findings: Secondary | ICD-10-CM | POA: Insufficient documentation

## 2010-08-16 DIAGNOSIS — Z1231 Encounter for screening mammogram for malignant neoplasm of breast: Secondary | ICD-10-CM

## 2010-08-16 LAB — CBC WITH DIFFERENTIAL/PLATELET
Basophils Absolute: 0 10*3/uL (ref 0.0–0.1)
Eosinophils Absolute: 0.1 10*3/uL (ref 0.0–0.7)
Lymphocytes Relative: 46.5 % — ABNORMAL HIGH (ref 12.0–46.0)
MCHC: 34.8 g/dL (ref 30.0–36.0)
MCV: 89.9 fl (ref 78.0–100.0)
Monocytes Absolute: 0.4 10*3/uL (ref 0.1–1.0)
Neutrophils Relative %: 41.1 % — ABNORMAL LOW (ref 43.0–77.0)
Platelets: 224 10*3/uL (ref 150.0–400.0)
RDW: 13.4 % (ref 11.5–14.6)

## 2010-08-16 LAB — BASIC METABOLIC PANEL
CO2: 30 mEq/L (ref 19–32)
Chloride: 101 mEq/L (ref 96–112)
Creatinine, Ser: 0.7 mg/dL (ref 0.4–1.2)
Potassium: 4.6 mEq/L (ref 3.5–5.1)

## 2010-08-16 LAB — TSH: TSH: 0.62 u[IU]/mL (ref 0.35–5.50)

## 2010-08-16 LAB — HEPATIC FUNCTION PANEL
ALT: 28 U/L (ref 0–35)
AST: 35 U/L (ref 0–37)
Alkaline Phosphatase: 69 U/L (ref 39–117)
Bilirubin, Direct: 0.1 mg/dL (ref 0.0–0.3)
Total Protein: 7.5 g/dL (ref 6.0–8.3)

## 2010-08-16 LAB — LIPID PANEL: Triglycerides: 57 mg/dL (ref 0.0–149.0)

## 2010-08-16 NOTE — Assessment & Plan Note (Signed)
Healthy Getting back to exercise Due for mammo UTD on colon and imms

## 2010-08-16 NOTE — Assessment & Plan Note (Signed)
On med using vinegar and honey also Will recheck lab Lab Results  Component Value Date   LDLCALC 92 09/18/2008

## 2010-08-16 NOTE — Patient Instructions (Signed)
Please set up your mammogram. 

## 2010-08-16 NOTE — Progress Notes (Signed)
Subjective:    Patient ID: Sheryl Miller, female    DOB: 1935/10/28, 75 y.o.   MRN: [id number]  HPI Reviewed her wellness visit survey Did have fall when visiting son in [place] Exercising regularly again  Mild recall memory issues No executive problems  Concerned about breast cancer--duaghter, sister and aunts with breast cancer No concerns on self exam---chronic cystic changes  No other new concerns  Still loses balance She relates to abnormal leg lengths Didn't want lift--may throw things off after so many years  Current Outpatient Prescriptions on File Prior to Visit  Medication Sig Dispense Refill  . aspirin 81 MG tablet Take 81 mg by mouth daily.        . Calcium-Vitamin D (CALTRATE 600 PLUS-VIT D PO) Take by mouth daily.        . Omega-3 Fatty Acids (FISH OIL) 1200 MG CAPS Take 2 capsules by mouth daily.        . pantoprazole (PROTONIX) 40 MG tablet Take 40 mg by mouth daily.        . simvastatin (ZOCOR) 40 MG tablet Take 1 tablet (40 mg total) by mouth at bedtime.  90 tablet  3  . solifenacin (VESICARE) 5 MG tablet Take 5 mg by mouth daily.        Marland Kitchen DISCONTD: Garlic (ODORLESS GARLIC) 1250 MG TABS Take by mouth daily.        Marland Kitchen DISCONTD: Multiple Vitamins-Minerals (ICAPS PLUS PO) Take by mouth daily.         Past Medical History  Diagnosis Date  . Raynaud's syndrome   . Dermatophytosis of nail   . Undiagnosed cardiac murmurs   . Osteoporosis, unspecified   . Diverticulosis of colon (without mention of hemorrhage)   . Female stress incontinence   . Internal hemorrhoids without mention of complication   . Unspecified constipation   . Esophageal reflux   . Other and unspecified hyperlipidemia   . Allergic rhinitis, cause unspecified     Past Surgical History  Procedure Date  . Bladder repair   . Abdominal hysterectomy   . Tonsillectomy   . Breast biopsy     fibrocystic disease  . Shoulder surgery      In 2006, bilaterally shoulder surgery for rotator  cuff.    Family History  Problem Relation Age of Onset  . Coronary artery disease Mother   . Heart disease Mother   . Diabetes Mother   . COPD Father   . Breast cancer Sister   . Diabetes Brother   . Breast cancer Maternal Aunt   . Stroke Paternal Grandmother   . Diabetes Brother     History   Social History  . Marital Status: Married    Spouse Name: N/A    Number of Children: 4  . Years of Education: N/A   Occupational History  . retired Airline pilot    Social History Main Topics  . Smoking status: Never Smoker   . Smokeless tobacco: Not on file  . Alcohol Use: Yes  . Drug Use: No  . Sexually Active: Not on file   Other Topics Concern  . Not on file   Social History Narrative   Regular exercise-yes, walking daily, line dancing 3 x/weekDiet:  No fruit, (+) veggies, no FFHas living will. Husband should make health care decisions as needed, then son Molly Maduro. Would accept resuscitation but no prolonged ventilation. Would not want feeding tube if not cognitively aware   Review of Systems  Constitutional: Negative for  fatigue and unexpected weight change.       Wears seat belt  HENT: Positive for congestion and rhinorrhea. Negative for hearing loss, dental problem and tinnitus.        Hay fever but hasn't needed meds Full upper, partial lower plates  Eyes: Negative for pain and visual disturbance.       Early glaucoma--has follow up soon and may need Rx No diplopia  Respiratory: Negative for cough, chest tightness and shortness of breath.   Cardiovascular: Positive for palpitations. Negative for chest pain and leg swelling.       Occ racing heart---usually at rest. Brief only  Genitourinary: Negative for dysuria, urgency, difficulty urinating and dyspareunia.       Urine much better on vesicare  Musculoskeletal: Positive for back pain. Negative for joint swelling and arthralgias.       Posterior right hip or back pain with walking  Skin: Negative for rash.       Having  something removed from back soon  Neurological: Positive for dizziness. Negative for syncope, weakness, numbness and headaches.       Rare dizziness  Hematological: Negative for adenopathy. Does not bruise/bleed easily.  Psychiatric/Behavioral: Negative for sleep disturbance and dysphoric mood. The patient is not nervous/anxious.        Objective:   Physical Exam  Constitutional: She is oriented to person, place, and time. She appears well-developed and well-nourished. No distress.  HENT:  Head: Normocephalic and atraumatic.  Right Ear: External ear normal.  Left Ear: External ear normal.  Mouth/Throat: Oropharynx is clear and moist. No oropharyngeal exudate.       TMs normal  Eyes: Conjunctivae and EOM are normal. Pupils are equal, round, and reactive to light.       Fundi benign  Neck: Normal range of motion. Neck supple. No thyromegaly present.  Cardiovascular: Normal rate, regular rhythm and intact distal pulses.  Exam reveals no gallop.   Murmur heard. Pulmonary/Chest: Effort normal and breath sounds normal. No respiratory distress. She has no wheezes. She has no rales.  Abdominal: Soft. She exhibits no mass. There is no tenderness.  Genitourinary:       Mild cystic changes bilaterally in breasts  Musculoskeletal: Normal range of motion. She exhibits no edema and no tenderness.  Lymphadenopathy:    She has no cervical adenopathy.    She has no axillary adenopathy.  Neurological: She is alert and oriented to person, place, and time. She exhibits normal muscle tone.       Normal strenth and gait  Skin: Skin is warm. No rash noted.       No suspicious lesions seb keratosis on back  Psychiatric: She has a normal mood and affect. Her behavior is normal. Judgment and thought content normal.          Assessment & Plan:

## 2010-08-16 NOTE — Assessment & Plan Note (Signed)
Really happy with the vesicare

## 2010-08-28 ENCOUNTER — Other Ambulatory Visit: Payer: Self-pay | Admitting: *Deleted

## 2010-08-28 NOTE — Telephone Encounter (Signed)
Opened in error

## 2010-09-03 ENCOUNTER — Ambulatory Visit (INDEPENDENT_AMBULATORY_CARE_PROVIDER_SITE_OTHER): Payer: Medicare PPO | Admitting: Internal Medicine

## 2010-09-03 DIAGNOSIS — Z23 Encounter for immunization: Secondary | ICD-10-CM

## 2010-09-04 ENCOUNTER — Emergency Department: Payer: Self-pay | Admitting: Emergency Medicine

## 2010-09-04 ENCOUNTER — Telehealth: Payer: Self-pay | Admitting: *Deleted
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
## 2010-09-04 NOTE — Progress Notes (Signed)
  Subjective:    Patient ID: Sheryl Miller, female    DOB: 25-Dec-1935, 75 y.o.   MRN: [id number]  HPI  Needed Tdap  Review of Systems     Objective:   Physical Exam        Assessment & Plan:

## 2010-09-04 NOTE — Telephone Encounter (Signed)
Please check on how she is doing.
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
## 2010-09-05 NOTE — Telephone Encounter (Signed)
Noted  

## 2010-09-10 ENCOUNTER — Ambulatory Visit (INDEPENDENT_AMBULATORY_CARE_PROVIDER_SITE_OTHER): Payer: Medicare PPO | Admitting: Internal Medicine

## 2010-09-10 ENCOUNTER — Encounter: Payer: Self-pay | Admitting: Internal Medicine

## 2010-09-10 VITALS — BP 120/80 | HR 74 | Temp 98.0°F | Wt 160.0 lb

## 2010-09-10 DIAGNOSIS — S139XXA Sprain of joints and ligaments of unspecified parts of neck, initial encounter: Secondary | ICD-10-CM

## 2010-09-10 MED ORDER — TRAMADOL HCL 50 MG PO TABS: 50.0000 mg | ORAL_TABLET | Freq: Three times a day (TID) | ORAL | Status: DC | PRN

## 2010-09-10 MED ORDER — IBUPROFEN 600 MG PO TABS: 600.0000 mg | ORAL_TABLET | Freq: Three times a day (TID) | ORAL | Status: AC

## 2010-09-10 MED ORDER — METHOCARBAMOL 750 MG PO TABS: 750.0000 mg | ORAL_TABLET | Freq: Every evening | ORAL | Status: DC | PRN
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
## 2010-09-10 NOTE — Patient Instructions (Signed)
Please try heat on your neck Use the ibuprofen regularly over the next 2 weeks and the other two meds as needed Call if you are not much better in 2 weeks for referral to Physical therapy

## 2010-10-11 ENCOUNTER — Ambulatory Visit
Admission: RE | Admit: 2010-10-11 | Discharge: 2010-10-11 | Disposition: A | Discharge status: Home / Self Care | Payer: Medicare PPO | Source: Ambulatory Visit | Attending: Internal Medicine | Admitting: Internal Medicine

## 2010-10-11 DIAGNOSIS — Z1231 Encounter for screening mammogram for malignant neoplasm of breast: Secondary | ICD-10-CM

## 2010-10-15 ENCOUNTER — Encounter: Payer: Self-pay | Admitting: *Deleted

## 2010-10-24 ENCOUNTER — Encounter: Payer: Self-pay | Admitting: Internal Medicine

## 2010-11-16 ENCOUNTER — Other Ambulatory Visit: Payer: Self-pay | Admitting: Internal Medicine

## 2010-12-05 LAB — URINALYSIS, ROUTINE W REFLEX MICROSCOPIC
Bilirubin Urine: NEGATIVE
Nitrite: NEGATIVE
Specific Gravity, Urine: 1.01
Urobilinogen, UA: 0.2

## 2010-12-05 LAB — CBC
MCHC: 34.5
Platelets: 204
Platelets: 270
RBC: 3.64 — ABNORMAL LOW
RDW: 13.4
WBC: 8.5

## 2010-12-05 LAB — BASIC METABOLIC PANEL
Calcium: 9.4
GFR calc Af Amer: >60
GFR calc non Af Amer: >60
Sodium: 137

## 2011-01-16 ENCOUNTER — Ambulatory Visit (INDEPENDENT_AMBULATORY_CARE_PROVIDER_SITE_OTHER): Payer: Medicare PPO

## 2011-01-16 DIAGNOSIS — Z23 Encounter for immunization: Secondary | ICD-10-CM

## 2011-02-19 ENCOUNTER — Ambulatory Visit: Payer: Medicare PPO | Admitting: Internal Medicine

## 2011-02-21 ENCOUNTER — Ambulatory Visit: Payer: Medicare PPO | Admitting: Internal Medicine

## 2011-03-21 ENCOUNTER — Other Ambulatory Visit: Payer: Self-pay | Admitting: *Deleted

## 2011-03-21 MED ORDER — SOLIFENACIN SUCCINATE 5 MG PO TABS: 5.0000 mg | ORAL_TABLET | Freq: Every day | ORAL | Status: DC

## 2011-03-21 MED ORDER — SIMVASTATIN 40 MG PO TABS: 40.0000 mg | ORAL_TABLET | Freq: Every day | ORAL | Status: DC

## 2011-04-23 ENCOUNTER — Emergency Department: Payer: Self-pay | Admitting: Emergency Medicine

## 2011-04-23 ENCOUNTER — Telehealth: Payer: Self-pay | Admitting: Internal Medicine

## 2011-04-23 LAB — URINALYSIS, COMPLETE
Bacteria: NONE SEEN
Blood: NEGATIVE
Glucose,UR: NEGATIVE mg/dL (ref 0–75)
Ketone: NEGATIVE
Leukocyte Esterase: NEGATIVE
Ph: 7 (ref 4.5–8.0)
Protein: NEGATIVE
Specific Gravity: 1.026 (ref 1.003–1.030)
Squamous Epithelial: 1

## 2011-04-23 LAB — CBC
HGB: 12.9 g/dL (ref 12.0–16.0)
MCV: 91 fL (ref 80–100)
Platelet: 221 10*3/uL (ref 150–440)
RBC: 4.24 10*6/uL (ref 3.80–5.20)
RDW: 13.1 % (ref 11.5–14.5)
WBC: 4.1 10*3/uL (ref 3.6–11.0)

## 2011-04-23 LAB — COMPREHENSIVE METABOLIC PANEL
Albumin: 3.8 g/dL (ref 3.4–5.0)
Alkaline Phosphatase: 60 U/L (ref 50–136)
Anion Gap: 7 (ref 7–16)
Calcium, Total: 9.3 mg/dL (ref 8.5–10.1)
Chloride: 100 mmol/L (ref 98–107)
Creatinine: 0.73 mg/dL (ref 0.60–1.30)
EGFR (African American): >60
Glucose: 99 mg/dL (ref 65–99)
Potassium: 4.2 mmol/L (ref 3.5–5.1)
SGOT(AST): 31 U/L (ref 15–37)
SGPT (ALT): 26 U/L
Total Protein: 7.3 g/dL (ref 6.4–8.2)

## 2011-04-23 LAB — PROTIME-INR
INR: 1
Prothrombin Time: 13.2 secs (ref 11.5–14.7)

## 2011-04-23 LAB — CK TOTAL AND CKMB (NOT AT ARMC): CK, Total: 106 U/L (ref 21–215)

## 2011-04-23 NOTE — Telephone Encounter (Signed)
Please call to check on her status tomorrow

## 2011-04-23 NOTE — Telephone Encounter (Signed)
Triage Record Num: [id number] Operator: Durward Mallard DiMatteis Patient Name: Sheryl Miller Call Date & Time: 04/23/2011 10:16:26AM Patient Phone: [phone] PCP: Tillman Abide Patient Gender: Female PCP Fax : [phone] Patient DOB: 12-24-1935 Practice Name: [facility] Reason for Call: Caller: Lauranne/Patient; PCP: Tillman Abide I.; CB#: [phone]; Call regarding her BP is running high and she has a headache; her left arm is weak also; states thatthe left arm is feelign "funny;" face is flushed; sx started 04/22/11; BP 157/92 was on 04/22/11; BP 125/84 pulse 68 at present time; Triaged per Headache Guideline; call 911 d/t unexplained weakness to the left arm; [place] [facility] by private vehicle per pt's choice; MD office made aware of pt sent to ER Protocol(s) Used: Headache Recommended Outcome per Protocol: Activate EMS 911 Reason for Outcome: New unexplained weakness/paralysis, change in sensation (numbness or tingling) or inability to purposely move, especially when one side of body is involved Care Advice: ~ 04/23/2011 10:36:15AM Page 1 of 1 [id number]

## 2011-04-24 NOTE — Telephone Encounter (Signed)
Spoke with pt and scheduled a f/u for Monday 04/28/11 at 12 pm.

## 2011-04-24 NOTE — Telephone Encounter (Signed)
Spoke to Dr Clemens Catholic in [facility] ER yesterday Concerned due to elevated BP and severity of headache CT and CT angio both normal Started NSAID  Please call to set her up with appt next week to follow up

## 2011-04-28 ENCOUNTER — Encounter: Payer: Self-pay | Admitting: Internal Medicine

## 2011-04-28 ENCOUNTER — Ambulatory Visit (INDEPENDENT_AMBULATORY_CARE_PROVIDER_SITE_OTHER): Payer: Medicare Other | Admitting: Internal Medicine

## 2011-04-28 DIAGNOSIS — S139XXA Sprain of joints and ligaments of unspecified parts of neck, initial encounter: Secondary | ICD-10-CM

## 2011-04-28 DIAGNOSIS — R519 Headache, unspecified: Secondary | ICD-10-CM | POA: Insufficient documentation

## 2011-04-28 DIAGNOSIS — R51 Headache: Secondary | ICD-10-CM | POA: Insufficient documentation

## 2011-04-28 NOTE — Progress Notes (Signed)
Subjective:    Patient ID: Sheryl Miller, female    DOB: 1936/02/06, 76 y.o.   MRN: [id number]  HPI ER records reviewed Discussed case with ER doctor, Dr Clemens Catholic, when she was there  Had sig left temporal headache Some left facial and left arm numbness Ibuprofen from ER did resolve the headache  BP has been elevated at home 176/87 by her cuff She did check it now and it was elevated when our measure wasn't Notes red face sometimes  No dizziness No chest pain No SOB No edema No palpitations now---did have some in years past Headache is better  Recent BP measurements with her cuff 102/66-168/88. Most systolic under 150  Current Outpatient Prescriptions on File Prior to Visit  Medication Sig Dispense Refill  . aspirin 81 MG tablet Take 81 mg by mouth daily.        . Calcium-Vitamin D (CALTRATE 600 PLUS-VIT D PO) Take 2 tablets by mouth daily.       . Omega-3 Fatty Acids (FISH OIL) 1200 MG CAPS Take 2 capsules by mouth daily.        . simvastatin (ZOCOR) 40 MG tablet Take 1 tablet (40 mg total) by mouth at bedtime.  90 tablet  3  . solifenacin (VESICARE) 5 MG tablet Take 1 tablet (5 mg total) by mouth daily.  90 tablet  3    No Known Allergies  Past Medical History  Diagnosis Date  . Raynaud's syndrome   . Dermatophytosis of nail   . Undiagnosed cardiac murmurs   . Osteoporosis, unspecified   . Diverticulosis of colon (without mention of hemorrhage)   . Female stress incontinence   . Internal hemorrhoids without mention of complication   . Unspecified constipation   . Esophageal reflux   . Other and unspecified hyperlipidemia   . Allergic rhinitis, cause unspecified     Past Surgical History  Procedure Date  . Bladder repair   . Abdominal hysterectomy   . Tonsillectomy   . Breast biopsy     fibrocystic disease  . Shoulder surgery      In 2006, bilaterally shoulder surgery for rotator cuff.    Family History  Problem Relation Age of Onset  . Coronary  artery disease Mother   . Heart disease Mother   . Diabetes Mother   . COPD Father   . Breast cancer Sister   . Diabetes Brother   . Breast cancer Maternal Aunt   . Stroke Paternal Grandmother   . Diabetes Brother     History   Social History  . Marital Status: Married    Spouse Name: N/A    Number of Children: 4  . Years of Education: N/A   Occupational History  . retired Airline pilot    Social History Main Topics  . Smoking status: Never Smoker   . Smokeless tobacco: Not on file  . Alcohol Use: Yes  . Drug Use: No  . Sexually Active: Not on file   Other Topics Concern  . Not on file   Social History Narrative   Regular exercise-yes, walking daily, line dancing 3 x/weekDiet:  No fruit, (+) veggies, no FFHas living will. Husband should make health care decisions as needed, then son Molly Maduro. Would accept resuscitation but no prolonged ventilation. Would not want feeding tube if not cognitively aware   Review of Systems Appetite is fine Sleep is okay if she doesn't get worries on her mind. Up twice for nocturia Generally rested in the AM upon  arising    Objective:   Physical Exam  Constitutional: She appears well-developed and well-nourished. No distress.  HENT:       No temporal bruits  Neck: Normal range of motion. Neck supple.  Cardiovascular: Normal rate, regular rhythm and normal heart sounds.  Exam reveals no gallop.   No murmur heard. Pulmonary/Chest: Effort normal and breath sounds normal. No respiratory distress. She has no wheezes. She has no rales.  Musculoskeletal: She exhibits no edema and no tenderness.       Calves sensitive though  Lymphadenopathy:    She has no cervical adenopathy.  Psychiatric: She has a normal mood and affect. Her behavior is normal. Judgment and thought content normal.          Assessment & Plan:

## 2011-04-28 NOTE — Assessment & Plan Note (Signed)
Severe episode prompting sig ER workup---which was negative Not sure her BP was ever really up---may be improper machine BP on right 114/62 on my check  Counseled about stopping the BP checks Increase exercise but BP not an issue

## 2011-04-28 NOTE — Assessment & Plan Note (Signed)
Better in general Doesn't seem to be part of the current issue

## 2011-04-28 NOTE — Patient Instructions (Signed)
Cancel March visit

## 2011-06-05 ENCOUNTER — Encounter: Payer: Medicare PPO | Admitting: Internal Medicine

## 2011-07-08 ENCOUNTER — Other Ambulatory Visit: Payer: Self-pay

## 2011-07-08 NOTE — Telephone Encounter (Signed)
Pt request Vesicare refill but pt will ck with [facility];  # 90 x 3 on 03/21/11. [facility] to contact our office if questions about refill.

## 2011-10-28 ENCOUNTER — Other Ambulatory Visit: Payer: Self-pay | Admitting: Internal Medicine

## 2011-10-28 DIAGNOSIS — Z1231 Encounter for screening mammogram for malignant neoplasm of breast: Secondary | ICD-10-CM

## 2011-10-31 ENCOUNTER — Ambulatory Visit (INDEPENDENT_AMBULATORY_CARE_PROVIDER_SITE_OTHER): Payer: Medicare Other | Admitting: Internal Medicine

## 2011-10-31 ENCOUNTER — Encounter: Payer: Self-pay | Admitting: Internal Medicine

## 2011-10-31 VITALS — BP 110/68 | HR 60 | Temp 98.1°F | Ht 63.0 in | Wt 157.0 lb

## 2011-10-31 DIAGNOSIS — E785 Hyperlipidemia, unspecified: Secondary | ICD-10-CM

## 2011-10-31 DIAGNOSIS — K219 Gastro-esophageal reflux disease without esophagitis: Secondary | ICD-10-CM

## 2011-10-31 DIAGNOSIS — N393 Stress incontinence (female) (male): Secondary | ICD-10-CM

## 2011-10-31 DIAGNOSIS — Z0000 Encounter for general adult medical examination without abnormal findings: Secondary | ICD-10-CM

## 2011-10-31 DIAGNOSIS — L820 Inflamed seborrheic keratosis: Secondary | ICD-10-CM

## 2011-10-31 LAB — CBC WITH DIFFERENTIAL/PLATELET
Basophils Absolute: 0 10*3/uL (ref 0.0–0.1)
Eosinophils Absolute: 0.1 10*3/uL (ref 0.0–0.7)
Eosinophils Relative: 1.7 % (ref 0.0–5.0)
Lymphs Abs: 1.7 10*3/uL (ref 0.7–4.0)
MCHC: 33.6 g/dL (ref 30.0–36.0)
MCV: 89.6 fl (ref 78.0–100.0)
Monocytes Absolute: 0.4 10*3/uL (ref 0.1–1.0)
Neutrophils Relative %: 48.3 % (ref 43.0–77.0)
Platelets: 213 10*3/uL (ref 150.0–400.0)
RDW: 13.4 % (ref 11.5–14.6)
WBC: 4.4 10*3/uL — ABNORMAL LOW (ref 4.5–10.5)

## 2011-10-31 LAB — HEPATIC FUNCTION PANEL
Albumin: 3.9 g/dL (ref 3.5–5.2)
Total Bilirubin: 0.8 mg/dL (ref 0.3–1.2)

## 2011-10-31 LAB — LIPID PANEL
Cholesterol: 138 mg/dL (ref 0–200)
LDL Cholesterol: 63 mg/dL (ref 0–99)
Triglycerides: 57 mg/dL (ref 0.0–149.0)

## 2011-10-31 LAB — TSH: TSH: 0.61 u[IU]/mL (ref 0.35–5.50)

## 2011-10-31 LAB — BASIC METABOLIC PANEL
BUN: 10 mg/dL (ref 6–23)
Chloride: 99 mEq/L (ref 96–112)
Creatinine, Ser: 0.8 mg/dL (ref 0.4–1.2)
Glucose, Bld: 91 mg/dL (ref 70–99)

## 2011-10-31 NOTE — Assessment & Plan Note (Signed)
Still with urgency and frequency but incontinence is controlled on the med

## 2011-10-31 NOTE — Assessment & Plan Note (Signed)
Doing well Urged her to be steady with her exercise program UTD on colon Due for mammo ---scheduled already

## 2011-10-31 NOTE — Progress Notes (Signed)
Subjective:    Patient ID: Sheryl Miller, female    DOB: [birth year], 76 y.o.   MRN: [id number]  HPI Here for physical  No recurrence of headaches BP has been fine  Has lesion on back she wants removed. Itchy and catches on clothes Some under right breast along flank also  Has follow up mammogram scheduled  Current Outpatient Prescriptions on File Prior to Visit  Medication Sig Dispense Refill  . aspirin 81 MG tablet Take 81 mg by mouth daily.        . Calcium-Vitamin D (CALTRATE 600 PLUS-VIT D PO) Take 2 tablets by mouth daily.       . cholecalciferol (VITAMIN D) 1000 UNITS tablet Take 1,000 Units by mouth daily.      . Coenzyme Q10 (CO Q 10) 100 MG CAPS Take by mouth daily.      . Garlic 100 MG TABS Take by mouth daily.      . simvastatin (ZOCOR) 40 MG tablet Take 1 tablet (40 mg total) by mouth at bedtime.  90 tablet  3  . solifenacin (VESICARE) 5 MG tablet Take 1 tablet (5 mg total) by mouth daily.  90 tablet  3    No Known Allergies  Past Medical History  Diagnosis Date  . Raynaud's syndrome   . Dermatophytosis of nail   . Undiagnosed cardiac murmurs   . Osteoporosis, unspecified   . Diverticulosis of colon (without mention of hemorrhage)   . Female stress incontinence   . Internal hemorrhoids without mention of complication   . Unspecified constipation   . Esophageal reflux   . Other and unspecified hyperlipidemia   . Allergic rhinitis, cause unspecified     Past Surgical History  Procedure Date  . Bladder repair   . Abdominal hysterectomy   . Tonsillectomy   . Breast biopsy     fibrocystic disease  . Shoulder surgery      In 2006, bilaterally shoulder surgery for rotator cuff.    Family History  Problem Relation Age of Onset  . Coronary artery disease Mother   . Heart disease Mother   . Diabetes Mother   . COPD Father   . Breast cancer Sister   . Diabetes Brother   . Breast cancer Maternal Aunt   . Stroke Paternal Grandmother   . Diabetes  Brother     History   Social History  . Marital Status: Married    Spouse Name: N/A    Number of Children: 4  . Years of Education: N/A   Occupational History  . retired Airline pilot    Social History Main Topics  . Smoking status: Never Smoker   . Smokeless tobacco: Never Used  . Alcohol Use: Yes  . Drug Use: No  . Sexually Active: Not on file   Other Topics Concern  . Not on file   Social History Narrative   Regular exercise-yes, walking or rides stationery bike daily, line dancing 3 x/weekHas living will. Husband should make health care decisions as needed, then son Molly Maduro. Would accept resuscitation but no prolonged ventilation. Would not want feeding tube if not cognitively aware   Review of Systems  Constitutional: Negative for fatigue and unexpected weight change.       Wears seat belt  HENT: Positive for congestion, rhinorrhea and sneezing. Negative for hearing loss and tinnitus.        No meds for AM nasal symptoms Overdue for dentist---partial on bottom, full on top  Eyes: Negative for  visual disturbance.       No diplopia or unilateral vision loss Glaucoma is improved on meds  Respiratory: Negative for cough, chest tightness and shortness of breath.   Cardiovascular: Positive for leg swelling. Negative for chest pain and palpitations.       Some end of day edema  Gastrointestinal: Positive for constipation. Negative for nausea, vomiting, abdominal pain and blood in stool.       Occ heartburn---hasn't needed meds lately Constipation relieved with occ miralax  Genitourinary: Positive for urgency and frequency. Negative for dyspareunia.       Vesicare prevents incontinence but still has symptoms  Musculoskeletal: Positive for back pain and arthralgias. Negative for joint swelling.       Rib pain after moving paving bricks around Chronic back pain--chiropractor helps  Skin: Negative for rash.       Bothersome keratoses Some scalp itching---better with dandruff shampoo   Neurological: Negative for dizziness, syncope, weakness, light-headedness, numbness and headaches.       Mild balance problems--uses cane  Hematological: Negative for adenopathy. Bruises/bleeds easily.  Psychiatric/Behavioral: Negative for disturbed wake/sleep cycle and dysphoric mood. The patient is not nervous/anxious.        Objective:   Physical Exam  Constitutional: She is oriented to person, place, and time. She appears well-developed and well-nourished. No distress.  HENT:  Head: Normocephalic and atraumatic.  Right Ear: External ear normal.  Left Ear: External ear normal.  Mouth/Throat: Oropharynx is clear and moist. No oropharyngeal exudate.  Eyes: Conjunctivae and EOM are normal. Pupils are equal, round, and reactive to light.  Neck: Normal range of motion. Neck supple. No thyromegaly present.  Cardiovascular: Normal rate, regular rhythm, normal heart sounds and intact distal pulses.  Exam reveals no gallop.   No murmur heard. Pulmonary/Chest: Effort normal and breath sounds normal. No respiratory distress. She has no wheezes. She has no rales.  Abdominal: Soft. There is no tenderness.  Genitourinary:       Breasts without masses or discharge  Musculoskeletal: Normal range of motion. She exhibits no edema and no tenderness.  Lymphadenopathy:    She has no cervical adenopathy.    She has no axillary adenopathy.  Neurological: She is alert and oriented to person, place, and time.  Skin: No rash noted. No erythema.       Irritated seb keratosis on upper lumbar right back Multiple other seborrheic keratoses  Psychiatric: She has a normal mood and affect. Her behavior is normal. Thought content normal.          Assessment & Plan:

## 2011-10-31 NOTE — Assessment & Plan Note (Signed)
Better Hasn't needed meds 

## 2011-10-31 NOTE — Assessment & Plan Note (Signed)
Liquid nitrogen Rx 40 seconds x 2 Tolerated well Discussed home care

## 2011-10-31 NOTE — Assessment & Plan Note (Signed)
No problems with med Due for labs 

## 2011-11-03 ENCOUNTER — Encounter: Payer: Self-pay | Admitting: *Deleted

## 2011-11-14 ENCOUNTER — Ambulatory Visit
Admission: RE | Admit: 2011-11-14 | Discharge: 2011-11-14 | Disposition: A | Discharge status: Home / Self Care | Payer: Medicare Other | Source: Ambulatory Visit | Attending: Internal Medicine | Admitting: Internal Medicine

## 2011-11-14 DIAGNOSIS — Z1231 Encounter for screening mammogram for malignant neoplasm of breast: Secondary | ICD-10-CM

## 2011-11-17 ENCOUNTER — Encounter: Payer: Self-pay | Admitting: *Deleted

## 2011-12-26 ENCOUNTER — Ambulatory Visit (INDEPENDENT_AMBULATORY_CARE_PROVIDER_SITE_OTHER): Payer: Medicare Other

## 2011-12-26 DIAGNOSIS — Z23 Encounter for immunization: Secondary | ICD-10-CM

## 2012-01-07 ENCOUNTER — Encounter: Payer: Self-pay | Admitting: Family Medicine

## 2012-01-07 ENCOUNTER — Ambulatory Visit (INDEPENDENT_AMBULATORY_CARE_PROVIDER_SITE_OTHER): Payer: Medicare Other | Admitting: Family Medicine

## 2012-01-07 VITALS — BP 108/64 | HR 64 | Temp 98.2°F | Wt 160.2 lb

## 2012-01-07 DIAGNOSIS — M259 Joint disorder, unspecified: Secondary | ICD-10-CM

## 2012-01-07 DIAGNOSIS — M67919 Unspecified disorder of synovium and tendon, unspecified shoulder: Secondary | ICD-10-CM

## 2012-01-07 DIAGNOSIS — M19019 Primary osteoarthritis, unspecified shoulder: Secondary | ICD-10-CM

## 2012-01-07 MED ORDER — DICLOFENAC SODIUM 75 MG PO TBEC: 75.0000 mg | DELAYED_RELEASE_TABLET | Freq: Two times a day (BID) | ORAL | Status: DC

## 2012-01-07 NOTE — Patient Instructions (Signed)
F/u 6 weeks 

## 2012-01-07 NOTE — Progress Notes (Signed)
Nature conservation officer at [facility] [address] [place] [address] Phone: [phone] Fax: [phone]  Date:  01/07/2012   Name:  Sheryl Miller   DOB:  Mar 02, 1936   MRN:  [id number] Gender: female Age: 76 y.o.  PCP:  Tillman Abide, MD  Evaluating MD: Hannah Beat, MD   Chief Complaint: Arm Pain   History of Present Illness:  Sheryl Miller is a 76 y.o. pleasant patient with a history of R SAD, mini-open RTC repair in 2006 who presents with the following:  The patient noted above presents with shoulder pain that has been ongoing for 3 mo.  there is no history of trauma or accident. The patient denies neck pain or radicular symptoms. Denies dislocation, subluxation, separation of the shoulder. The patient does complain of pain in the overhead plane, reaching across body, and IROM  Medications Tried: tylenol Ice or Heat: no Tried PT: No  Prior shoulder Injury: RTC tear Prior surgery: h/o B RTC repairs, SAD Prior fracture: No  Patient Active Problem List  Diagnosis  . HYPERLIPIDEMIA  . CAROTID ARTERY STENOSIS, BILATERAL  . RAYNAUD'S DISEASE  . VARICOSE VEINS, LOWER EXTREMITIES  . HEMORRHOIDS, INTERNAL  . ALLERGIC RHINITIS  . GERD  . DIVERTICULOSIS, COLON  . CONSTIPATION  . URINARY INCONTINENCE, STRESS, FEMALE  . OSTEOPOROSIS  . PES PLANUS  . UNEQUAL LEG LENGTH  . Routine general medical examination at a health care facility  . Cervical sprain  . Headache  . Inflamed seborrheic keratosis    Past Medical History  Diagnosis Date  . Raynaud's syndrome   . Dermatophytosis of nail   . Undiagnosed cardiac murmurs   . Osteoporosis, unspecified   . Diverticulosis of colon (without mention of hemorrhage)   . Female stress incontinence   . Internal hemorrhoids without mention of complication   . Unspecified constipation   . Esophageal reflux   . Other and unspecified hyperlipidemia   . Allergic rhinitis, cause unspecified     Past Surgical  History  Procedure Date  . Bladder repair   . Abdominal hysterectomy   . Tonsillectomy   . Breast biopsy     fibrocystic disease  . Shoulder surgery      In 2006, bilaterally shoulder surgery for rotator cuff.    History  Substance Use Topics  . Smoking status: Never Smoker   . Smokeless tobacco: Never Used  . Alcohol Use: No    Family History  Problem Relation Age of Onset  . Coronary artery disease Mother   . Heart disease Mother   . Diabetes Mother   . COPD Father   . Breast cancer Sister   . Diabetes Brother   . Breast cancer Maternal Aunt   . Stroke Paternal Grandmother   . Diabetes Brother     No Known Allergies  Medication list has been reviewed and updated.  Outpatient Prescriptions Prior to Visit  Medication Sig Dispense Refill  . aspirin 81 MG tablet Take 81 mg by mouth daily.        . Calcium-Vitamin D (CALTRATE 600 PLUS-VIT D PO) Take 1 tablet by mouth daily.       . Coenzyme Q10 (CO Q 10) 100 MG CAPS Take by mouth daily.      . COMBIGAN 0.2-0.5 % ophthalmic solution       . Garlic 100 MG TABS Take by mouth daily.      Boris Lown Oil 300 MG CAPS Take 1 capsule by mouth daily.      Marland Kitchen  Multiple Vitamins-Minerals (CENTRUM SILVER ULTRA WOMENS) TABS Take by mouth daily.      . simvastatin (ZOCOR) 40 MG tablet Take 1 tablet (40 mg total) by mouth at bedtime.  90 tablet  3  . solifenacin (VESICARE) 5 MG tablet Take 1 tablet (5 mg total) by mouth daily.  90 tablet  3  . cholecalciferol (VITAMIN D) 1000 UNITS tablet Take 1,000 Units by mouth daily.        Review of Systems:   GEN: No fevers, chills. Nontoxic. Primarily MSK c/o today. MSK: Detailed in the HPI GI: tolerating PO intake without difficulty Neuro: No numbness, parasthesias, or tingling associated. Otherwise the pertinent positives of the ROS are noted above.    Physical Examination: Filed Vitals:   01/07/12 1147  BP: 108/64  Pulse: 64  Temp: 98.2 F (36.8 C)  TempSrc: Oral  Weight: 160 lb 4  oz (72.689 kg)    There is no height on file to calculate BMI. Ideal Body Weight:     GEN: Well-developed,well-nourished,in no acute distress; alert,appropriate and cooperative throughout examination HEENT: Normocephalic and atraumatic without obvious abnormalities. Ears, externally no deformities PULM: Breathing comfortably in no respiratory distress EXT: No clubbing, cyanosis, or edema PSYCH: Normally interactive. Cooperative during the interview. Pleasant. Friendly and conversant. Not anxious or depressed appearing. Normal, full affect.  Shoulder: R Inspection: No muscle wasting or winging Ecchymosis/edema: neg  AC joint, scapula, clavicle: TTP AC JOINT Cervical spine: NT, full ROM Spurling's: neg Abduction: full, 5/5 Flexion: full, 5/5 IR, full, lift-off: 5/5 ER at neutral: full, 5/5 AC crossover: POS Neer: pos Hawkins: pos Drop Test: neg Empty Can: pos Supraspinatus insertion: mild-mod T Bicipital groove: NT Speed's: neg Yergason's: neg Sulcus sign: neg Scapular dyskinesis: none C5-T1 intact  Neuro: Sensation intact Grip 5/5   Assessment and Plan:  1. Tendinopathy of rotator cuff  Ambulatory referral to Physical Therapy  2. AC joint arthropathy  Ambulatory referral to Physical Therapy   Classic RTC tendinopathy with t-shirt pain distribution, ac joint involvement Good rehab potential, well-versed from post-op rehab  subac inj to help with pain control, oral nsaids  SubAC Injection, RIGHT Verbal consent was obtained from the patient. Risks (including rare infection), benefits, and alternatives were explained. Patient prepped with Chloraprep and Ethyl Chloride used for anesthesia. The subacromial space was injected using the posterior approach. The patient tolerated the procedure well and had decreased pain post injection. No complications. Injection: 8 cc of Lidocaine 1% and 2 cc of Depo-Medrol 40 mg. Needle: 22 gauge  F/u 6 weeks  Orders Today:  Orders  Placed This Encounter  Procedures  . Ambulatory referral to Physical Therapy    Referral Priority:  Routine    Referral Type:  Physical Medicine    Referral Reason:  Specialty Services Required    Requested Specialty:  Physical Therapy    Number of Visits Requested:  1    Updated Medication List: (Includes new medications, updates to list, dose adjustments) Meds ordered this encounter  Medications  . diclofenac (VOLTAREN) 75 MG EC tablet    Sig: Take 1 tablet (75 mg total) by mouth 2 (two) times daily.    Dispense:  60 tablet    Refill:  3    Medications Discontinued: There are no discontinued medications.   Hannah Beat, MD

## 2012-05-24 ENCOUNTER — Other Ambulatory Visit: Payer: Self-pay

## 2012-05-24 MED ORDER — SOLIFENACIN SUCCINATE 5 MG PO TABS: 5.0000 mg | ORAL_TABLET | Freq: Every day | ORAL | Status: DC

## 2012-05-24 NOTE — Telephone Encounter (Signed)
Pt request refill vesicare # 14 to [facility] [address] while waiting on rx from mail order. Pt also request # 90 x 1 to [facility] for vesicare. Pt has CPX scheduled in 10/2012. Advised pt done.

## 2012-05-25 ENCOUNTER — Encounter: Payer: Self-pay | Admitting: *Deleted

## 2012-06-08 NOTE — Telephone Encounter (Signed)
This encounter was created in error - please disregard.

## 2012-06-16 ENCOUNTER — Ambulatory Visit (INDEPENDENT_AMBULATORY_CARE_PROVIDER_SITE_OTHER): Payer: Medicare Other | Admitting: Internal Medicine

## 2012-06-16 ENCOUNTER — Telehealth: Payer: Self-pay

## 2012-06-16 ENCOUNTER — Encounter: Payer: Self-pay | Admitting: Internal Medicine

## 2012-06-16 VITALS — BP 100/60 | HR 66 | Temp 97.9°F | Wt 160.0 lb

## 2012-06-16 DIAGNOSIS — J209 Acute bronchitis, unspecified: Secondary | ICD-10-CM | POA: Insufficient documentation

## 2012-06-16 MED ORDER — HYDROCODONE-HOMATROPINE 5-1.5 MG/5ML PO SYRP: 5.0000 mL | ORAL_SOLUTION | Freq: Every evening | ORAL | Status: DC | PRN

## 2012-06-16 MED ORDER — AZITHROMYCIN 250 MG PO TABS: ORAL_TABLET | ORAL | Status: DC

## 2012-06-16 NOTE — Progress Notes (Signed)
Subjective:    Patient ID: Sheryl Miller, female    DOB: 1935-10-05, 77 y.o.   MRN: [id number]  HPI At end of march, went to [facility] in [place] with senior center Everyone got sick Coughing since then--nonproductive No energy--just lying around the house for a while due to this  No fever No SOB Some phlegm in throat a couple of days ago--better. No sore throat No sig nasal congestion No ear pain  Tried robitussin and tylenol. mucinex also Not clearly helpful  Current Outpatient Prescriptions on File Prior to Visit  Medication Sig Dispense Refill  . aspirin 81 MG tablet Take 81 mg by mouth daily.        . Calcium-Vitamin D (CALTRATE 600 PLUS-VIT D PO) Take 1 tablet by mouth daily.       . cholecalciferol (VITAMIN D) 1000 UNITS tablet Take 1,000 Units by mouth daily.      . Coenzyme Q10 (CO Q 10) 100 MG CAPS Take by mouth daily.      . COMBIGAN 0.2-0.5 % ophthalmic solution       . diclofenac (VOLTAREN) 75 MG EC tablet Take 1 tablet (75 mg total) by mouth 2 (two) times daily.  60 tablet  3  . Garlic 100 MG TABS Take by mouth daily.      Boris Lown Oil 300 MG CAPS Take 1 capsule by mouth daily.      . Multiple Vitamins-Minerals (CENTRUM SILVER ULTRA WOMENS) TABS Take by mouth daily.      . simvastatin (ZOCOR) 40 MG tablet Take 1 tablet (40 mg total) by mouth at bedtime.  90 tablet  3  . solifenacin (VESICARE) 5 MG tablet Take 1 tablet (5 mg total) by mouth daily.  14 tablet  0  . [DISCONTINUED] solifenacin (VESICARE) 5 MG tablet Take 1 tablet (5 mg total) by mouth daily.  90 tablet  3   No current facility-administered medications on file prior to visit.    No Known Allergies  Past Medical History  Diagnosis Date  . Raynaud's syndrome   . Dermatophytosis of nail   . Undiagnosed cardiac murmurs   . Osteoporosis, unspecified   . Diverticulosis of colon (without mention of hemorrhage)   . Female stress incontinence   . Internal hemorrhoids without mention of complication   .  Unspecified constipation   . Esophageal reflux   . Other and unspecified hyperlipidemia   . Allergic rhinitis, cause unspecified     Past Surgical History  Procedure Laterality Date  . Bladder repair    . Abdominal hysterectomy    . Tonsillectomy    . Breast biopsy      fibrocystic disease  . Shoulder surgery       In 2006, bilaterally shoulder surgery for rotator cuff.  . Shoulder arthroscopy with rotator cuff repair and subacromial decompression  08/2001    R, Dorene Grebe    Family History  Problem Relation Age of Onset  . Coronary artery disease Mother   . Heart disease Mother   . Diabetes Mother   . COPD Father   . Breast cancer Sister   . Diabetes Brother   . Breast cancer Maternal Aunt   . Stroke Paternal Grandmother   . Diabetes Brother     History   Social History  . Marital Status: Married    Spouse Name: N/A    Number of Children: 4  . Years of Education: N/A   Occupational History  . retired Airline pilot  Social History Main Topics  . Smoking status: Never Smoker   . Smokeless tobacco: Never Used  . Alcohol Use: No  . Drug Use: No  . Sexually Active: Not on file   Other Topics Concern  . Not on file   Social History Narrative   Regular exercise-yes, walking or rides stationery bike daily, line dancing 3 x/week      Has living will. Husband should make health care decisions as needed, then son Molly Maduro. Would accept resuscitation but no prolonged ventilation. Would not want feeding tube if not cognitively aware            Review of Systems No rash No vomiting but had loose stools at the beginning--this is better now    Objective:   Physical Exam  Constitutional: She appears well-developed and well-nourished. No distress.  HENT:  Mouth/Throat: Oropharynx is clear and moist. No oropharyngeal exudate.  No sinus tenderness TMs normal ?septum deviated to right---some inflammation on right  Neck: Normal range of motion. Neck supple.   Pulmonary/Chest: Effort normal and breath sounds normal. No respiratory distress. She has no wheezes. She has no rales.  Lymphadenopathy:    She has no cervical adenopathy.          Assessment & Plan:

## 2012-06-16 NOTE — Telephone Encounter (Signed)
Pt left v/m that [facility] did not receive rx today; [facility] and pt picked up both rx this afternoon. Spoke with pt and she did pick up both prescriptions.

## 2012-06-16 NOTE — Assessment & Plan Note (Signed)
Still non productive but going on for 2 weeks Will try z-pak Hycodan for sleep

## 2012-06-23 ENCOUNTER — Ambulatory Visit (INDEPENDENT_AMBULATORY_CARE_PROVIDER_SITE_OTHER)
Admission: RE | Admit: 2012-06-23 | Discharge: 2012-06-23 | Disposition: A | Discharge status: Home / Self Care | Payer: Medicare Other | Source: Ambulatory Visit | Attending: Family Medicine | Admitting: Family Medicine

## 2012-06-23 ENCOUNTER — Ambulatory Visit (INDEPENDENT_AMBULATORY_CARE_PROVIDER_SITE_OTHER): Payer: Medicare Other | Admitting: Family Medicine

## 2012-06-23 ENCOUNTER — Encounter: Payer: Self-pay | Admitting: Family Medicine

## 2012-06-23 VITALS — BP 112/68 | HR 67 | Temp 97.9°F | Ht 63.0 in | Wt 160.5 lb

## 2012-06-23 DIAGNOSIS — J209 Acute bronchitis, unspecified: Secondary | ICD-10-CM

## 2012-06-23 MED ORDER — HYDROCOD POLST-CHLORPHEN POLST 10-8 MG/5ML PO LQCR: 5.0000 mL | Freq: Two times a day (BID) | ORAL | Status: DC | PRN

## 2012-06-23 NOTE — Patient Instructions (Addendum)
I think you may have a cyclic cough from the bronchitis you had  Try tussionex (this is a different kind of cough syrup that is very strong)- use caution because it is sedating  Chest xray today- we will call you with results If you develop fever or shortness of breath let me know  Update if not starting to improve in a week or if worsening

## 2012-06-23 NOTE — Progress Notes (Signed)
Subjective:    Patient ID: Sheryl Miller, female    DOB: Jul 29, 1935, 77 y.o.   MRN: [id number]  HPI Has been sick since the 3/28 - after she went on a trip  She has had bronchitis - seen 4/9 Dr Alphonsus Sias gave her a zpak  Still very tired Cough continues - all dry , and in the night /am (better during the day) Robitussin  Honey  Tylenol   No fever since last visit  No nasal symptoms  No facial pain No known lung dz or asthma or copd   Using hycodan at night - ? If helping    She has gerd -no meds currently  Has not noticed more heartburn lately   Patient Active Problem List  Diagnosis  . HYPERLIPIDEMIA  . CAROTID ARTERY STENOSIS, BILATERAL  . RAYNAUD'S DISEASE  . VARICOSE VEINS, LOWER EXTREMITIES  . HEMORRHOIDS, INTERNAL  . ALLERGIC RHINITIS  . GERD  . DIVERTICULOSIS, COLON  . CONSTIPATION  . URINARY INCONTINENCE, STRESS, FEMALE  . OSTEOPOROSIS  . PES PLANUS  . UNEQUAL LEG LENGTH  . Routine general medical examination at a health care facility  . Cervical sprain  . Inflamed seborrheic keratosis  . Acute bronchitis   Past Medical History  Diagnosis Date  . Raynaud's syndrome   . Dermatophytosis of nail   . Undiagnosed cardiac murmurs   . Osteoporosis, unspecified   . Diverticulosis of colon (without mention of hemorrhage)   . Female stress incontinence   . Internal hemorrhoids without mention of complication   . Unspecified constipation   . Esophageal reflux   . Other and unspecified hyperlipidemia   . Allergic rhinitis, cause unspecified    Past Surgical History  Procedure Laterality Date  . Bladder repair    . Abdominal hysterectomy    . Tonsillectomy    . Breast biopsy      fibrocystic disease  . Shoulder surgery       In 2006, bilaterally shoulder surgery for rotator cuff.  . Shoulder arthroscopy with rotator cuff repair and subacromial decompression  08/2001    R, Dorene Grebe   History  Substance Use Topics  . Smoking status: Never Smoker    . Smokeless tobacco: Never Used  . Alcohol Use: No   Family History  Problem Relation Age of Onset  . Coronary artery disease Mother   . Heart disease Mother   . Diabetes Mother   . COPD Father   . Breast cancer Sister   . Diabetes Brother   . Breast cancer Maternal Aunt   . Stroke Paternal Grandmother   . Diabetes Brother    No Known Allergies Current Outpatient Prescriptions on File Prior to Visit  Medication Sig Dispense Refill  . aspirin 81 MG tablet Take 81 mg by mouth daily.        . Calcium-Vitamin D (CALTRATE 600 PLUS-VIT D PO) Take 1 tablet by mouth daily.       . cholecalciferol (VITAMIN D) 1000 UNITS tablet Take 1,000 Units by mouth daily.      . Coenzyme Q10 (CO Q 10) 100 MG CAPS Take by mouth daily.      . COMBIGAN 0.2-0.5 % ophthalmic solution       . diclofenac (VOLTAREN) 75 MG EC tablet Take 1 tablet (75 mg total) by mouth 2 (two) times daily.  60 tablet  3  . Garlic 100 MG TABS Take by mouth daily.      Boris Lown Oil 300 MG CAPS  Take 1 capsule by mouth daily.      . Multiple Vitamins-Minerals (CENTRUM SILVER ULTRA WOMENS) TABS Take by mouth daily.      . simvastatin (ZOCOR) 40 MG tablet Take 1 tablet (40 mg total) by mouth at bedtime.  90 tablet  3  . solifenacin (VESICARE) 5 MG tablet Take 1 tablet (5 mg total) by mouth daily.  14 tablet  0  . vitamin B-12 (CYANOCOBALAMIN) 1000 MCG tablet Take 1,000 mcg by mouth daily.      . [DISCONTINUED] solifenacin (VESICARE) 5 MG tablet Take 1 tablet (5 mg total) by mouth daily.  90 tablet  3   No current facility-administered medications on file prior to visit.    Review of Systems Review of Systems  Constitutional: Negative for fever, appetite change, fatigue and unexpected weight change.  ENT neg for ST or sinus pain  Eyes: Negative for pain and visual disturbance.  Respiratory: Negative for shortness of breath.  pos for persistent cough Cardiovascular: Negative for cp or palpitations    Gastrointestinal: Negative  for nausea, diarrhea and constipation.  Genitourinary: Negative for urgency and frequency.  Skin: Negative for pallor or rash   Neurological: Negative for weakness, light-headedness, numbness and headaches.  Hematological: Negative for adenopathy. Does not bruise/bleed easily.  Psychiatric/Behavioral: Negative for dysphoric mood. The patient is not nervous/anxious.         Objective:   Physical Exam  Constitutional: She appears well-developed and well-nourished. No distress.  HENT:  Head: Normocephalic and atraumatic.  Right Ear: External ear normal.  Left Ear: External ear normal.  Nose: Nose normal.  Mouth/Throat: Oropharynx is clear and moist.  Nares are boggy but clear No sinus tenderness  Eyes: Conjunctivae and EOM are normal. Pupils are equal, round, and reactive to light. Right eye exhibits no discharge. Left eye exhibits no discharge.  Neck: Normal range of motion. Neck supple. No tracheal deviation present. No thyromegaly present.  Cardiovascular: Normal rate and regular rhythm.   Pulmonary/Chest: Effort normal and breath sounds normal. No respiratory distress. She has no wheezes. She has no rales. She exhibits no tenderness.  No crackles or rhonchi No wheeze even on forced exp No prolonged exp phase Harsh dry cough  Lymphadenopathy:    She has no cervical adenopathy.  Neurological: She is alert.  Skin: Skin is warm and dry. No rash noted. No pallor.  Psychiatric: She has a normal mood and affect.          Assessment & Plan:

## 2012-06-24 NOTE — Assessment & Plan Note (Signed)
Persistent and poss cyclic cough after bronchitis -non prod (with resolution of other symptoms besides fatigue) Will try tussionex to break cough cycle-disc risk of sedation/ dizziness/ habit-will use caution Update if not starting to improve in a week or if worsening  -esp if prod cough / fever or sob

## 2012-07-08 ENCOUNTER — Ambulatory Visit (INDEPENDENT_AMBULATORY_CARE_PROVIDER_SITE_OTHER): Payer: Medicare Other | Admitting: Internal Medicine

## 2012-07-08 ENCOUNTER — Encounter: Payer: Self-pay | Admitting: Internal Medicine

## 2012-07-08 ENCOUNTER — Ambulatory Visit: Payer: Medicare Other | Admitting: Internal Medicine

## 2012-07-08 VITALS — BP 110/60 | HR 75 | Temp 98.0°F | Wt 159.0 lb

## 2012-07-08 DIAGNOSIS — R05 Cough: Secondary | ICD-10-CM | POA: Insufficient documentation

## 2012-07-08 DIAGNOSIS — D2330 Other benign neoplasm of skin of unspecified part of face: Secondary | ICD-10-CM | POA: Insufficient documentation

## 2012-07-08 MED ORDER — MAGIC MOUTHWASH W/LIDOCAINE: 5.0000 mL | Freq: Four times a day (QID) | ORAL | Status: DC

## 2012-07-08 NOTE — Patient Instructions (Signed)
If the irritation and cough is not better within 2 weeks, call and I will set you up with an ENT physician

## 2012-07-08 NOTE — Assessment & Plan Note (Signed)
And 1 on upper back Liquid nitrogen to each 40 seconds x 2 Tolerated well discussed home care

## 2012-07-08 NOTE — Progress Notes (Signed)
Subjective:    Patient ID: Sheryl Miller, female    DOB: 29-Oct-1935, 77 y.o.   MRN: [id number]  HPI Still has a spot at the back of her throat Irritates her and causes cough  No longer feels sick Non productive cough No fever No SOB  No swallowing problems Tried honey and then gargling with salt water (yesterday)---didn't clearly help  Current Outpatient Prescriptions on File Prior to Visit  Medication Sig Dispense Refill  . aspirin 81 MG tablet Take 81 mg by mouth daily.        . Calcium-Vitamin D (CALTRATE 600 PLUS-VIT D PO) Take 1 tablet by mouth daily.       . cholecalciferol (VITAMIN D) 1000 UNITS tablet Take 1,000 Units by mouth daily.      . Coenzyme Q10 (CO Q 10) 100 MG CAPS Take by mouth daily.      . COMBIGAN 0.2-0.5 % ophthalmic solution 1 drop every 12 (twelve) hours.       . diclofenac (VOLTAREN) 75 MG EC tablet Take 1 tablet (75 mg total) by mouth 2 (two) times daily.  60 tablet  3  . Garlic 100 MG TABS Take by mouth daily.      Boris Lown Oil 300 MG CAPS Take 1 capsule by mouth daily.      . Multiple Vitamins-Minerals (CENTRUM SILVER ULTRA WOMENS) TABS Take by mouth daily.      . solifenacin (VESICARE) 5 MG tablet Take 1 tablet (5 mg total) by mouth daily.  14 tablet  0  . vitamin B-12 (CYANOCOBALAMIN) 1000 MCG tablet Take 1,000 mcg by mouth daily.      . [DISCONTINUED] solifenacin (VESICARE) 5 MG tablet Take 1 tablet (5 mg total) by mouth daily.  90 tablet  3   No current facility-administered medications on file prior to visit.    No Known Allergies  Past Medical History  Diagnosis Date  . Raynaud's syndrome   . Dermatophytosis of nail   . Undiagnosed cardiac murmurs   . Osteoporosis, unspecified   . Diverticulosis of colon (without mention of hemorrhage)   . Female stress incontinence   . Internal hemorrhoids without mention of complication   . Unspecified constipation   . Esophageal reflux   . Other and unspecified hyperlipidemia   . Allergic  rhinitis, cause unspecified     Past Surgical History  Procedure Laterality Date  . Bladder repair    . Abdominal hysterectomy    . Tonsillectomy    . Breast biopsy      fibrocystic disease  . Shoulder surgery       In 2006, bilaterally shoulder surgery for rotator cuff.  . Shoulder arthroscopy with rotator cuff repair and subacromial decompression  08/2001    R, Dorene Grebe    Family History  Problem Relation Age of Onset  . Coronary artery disease Mother   . Heart disease Mother   . Diabetes Mother   . COPD Father   . Breast cancer Sister   . Diabetes Brother   . Breast cancer Maternal Aunt   . Stroke Paternal Grandmother   . Diabetes Brother     History   Social History  . Marital Status: Married    Spouse Name: N/A    Number of Children: 4  . Years of Education: N/A   Occupational History  . retired Airline pilot    Social History Main Topics  . Smoking status: Never Smoker   . Smokeless tobacco: Never Used  .  Alcohol Use: No  . Drug Use: No  . Sexually Active: Not on file   Other Topics Concern  . Not on file   Social History Narrative   Regular exercise-yes, walking or rides stationery bike daily, line dancing 3 x/week      Has living will. Husband should make health care decisions as needed, then son Molly Maduro. Would accept resuscitation but no prolonged ventilation. Would not want feeding tube if not cognitively aware            Review of Systems No heartburn No apparent post nasal drip    Objective:   Physical Exam  Constitutional: She appears well-developed and well-nourished. No distress.  HENT:  No pharyngeal injection---does have white area on posterior pharynx that is not exudate. Doesn't look neoplastic TMs normal No sig nasal inflammation  Neck: Normal range of motion. Neck supple.  Pulmonary/Chest: Effort normal and breath sounds normal. No respiratory distress. She has no wheezes. She has no rales.  Lymphadenopathy:    She has no cervical  adenopathy.  Skin:  4-65mm dermatofibroma on upper back on one on right upper cheek (lateral to eye)          Assessment & Plan:

## 2012-07-08 NOTE — Assessment & Plan Note (Signed)
Seems irritative Okay to continue honey Will try magic mouthwash If persists, will set up with ENT

## 2012-07-09 ENCOUNTER — Telehealth: Payer: Self-pay | Admitting: *Deleted

## 2012-07-09 NOTE — Telephone Encounter (Signed)
Pharmacy ( [facility] [address]) sent fax requesting how much of each ingredient to used in Magic Mouth/ lidocaine compound?

## 2012-07-09 NOTE — Telephone Encounter (Signed)
[facility] already has answer

## 2012-07-09 NOTE — Telephone Encounter (Signed)
1:1 ratio.

## 2012-07-21 ENCOUNTER — Other Ambulatory Visit: Payer: Self-pay | Admitting: *Deleted

## 2012-07-21 MED ORDER — SIMVASTATIN 40 MG PO TABS: 40.0000 mg | ORAL_TABLET | Freq: Every day | ORAL | Status: DC

## 2012-08-05 ENCOUNTER — Ambulatory Visit: Payer: Self-pay | Admitting: Obstetrics and Gynecology

## 2012-08-06 ENCOUNTER — Encounter: Payer: Self-pay | Admitting: Internal Medicine

## 2012-08-12 ENCOUNTER — Telehealth: Payer: Self-pay | Admitting: *Deleted

## 2012-08-12 MED ORDER — TRAZODONE HCL 50 MG PO TABS: 50.0000 mg | ORAL_TABLET | Freq: Every day | ORAL | Status: DC

## 2012-08-12 NOTE — Telephone Encounter (Signed)
Spoke with patient and advised results rx sent to pharmacy by e-script  

## 2012-08-12 NOTE — Telephone Encounter (Signed)
Please confirm pharmacy Send Rx for trazodone 50mg  1-2 at bedtime to help sleep #60 x3 Have her start the first 4 nights with a half tab---then increase to a full tab if no problems. If she is still not sleeping well after 2 weeks, will increase to 2 tabs at bedtime

## 2012-08-12 NOTE — Telephone Encounter (Signed)
rx called into pharmacy

## 2012-09-13 ENCOUNTER — Other Ambulatory Visit: Payer: Self-pay | Admitting: *Deleted

## 2012-09-13 MED ORDER — SOLIFENACIN SUCCINATE 5 MG PO TABS: 5.0000 mg | ORAL_TABLET | Freq: Every day | ORAL | Status: DC

## 2012-09-22 ENCOUNTER — Ambulatory Visit: Payer: Self-pay | Admitting: General Surgery

## 2012-09-28 ENCOUNTER — Ambulatory Visit: Payer: Self-pay | Admitting: General Surgery

## 2012-09-30 ENCOUNTER — Encounter: Payer: Self-pay | Admitting: General Surgery

## 2012-09-30 ENCOUNTER — Ambulatory Visit (INDEPENDENT_AMBULATORY_CARE_PROVIDER_SITE_OTHER): Payer: Medicare Other | Admitting: General Surgery

## 2012-09-30 VITALS — BP 124/78 | HR 76 | Resp 12 | Ht 63.0 in | Wt 158.0 lb

## 2012-09-30 DIAGNOSIS — N644 Mastodynia: Secondary | ICD-10-CM

## 2012-09-30 NOTE — Patient Instructions (Addendum)
Continue self breast exams. Call office for any new breast issues or concerns. 

## 2012-09-30 NOTE — Progress Notes (Signed)
Patient ID: Sheryl Miller, female   DOB: [birth year], 77 y.o.   MRN: [id number]  Chief Complaint  Patient presents with  . Mass    new patient right breast mass     HPI Sheryl Miller is a 77 y.o. female who presents for a breast evaluation referred by Dr Janene Harvey. Mammogram was 08-05-12. Patient" can't feel anything" in the right breast but it was tender when she did the exam. Denies any breast trauma.  Does do regular breast exams and has regular mammograms.    HPI  Past Medical History  Diagnosis Date  . Raynaud's syndrome   . Dermatophytosis of nail   . Undiagnosed cardiac murmurs   . Osteoporosis, unspecified   . Diverticulosis of colon (without mention of hemorrhage)   . Female stress incontinence   . Internal hemorrhoids without mention of complication   . Unspecified constipation   . Esophageal reflux   . Other and unspecified hyperlipidemia   . Allergic rhinitis, cause unspecified   . Rheumatic fever   . Breast cyst     Past Surgical History  Procedure Laterality Date  . Bladder repair    . Abdominal hysterectomy    . Tonsillectomy    . Breast biopsy  2000    fibrocystic disease  . Shoulder surgery       In 2006, bilaterally shoulder surgery for rotator cuff.  . Shoulder arthroscopy with rotator cuff repair and subacromial decompression  08/2001    R, Dorene Miller  . Tubal ligation      Family History  Problem Relation Age of Onset  . Coronary artery disease Mother   . Heart disease Mother   . Diabetes Mother   . COPD Father   . Breast cancer Sister     2 (age 49?, age 56?)  . Diabetes Brother   . Breast cancer Maternal Aunt     2  . Stroke Paternal Grandmother   . Diabetes Brother   . Breast cancer Daughter     ? at age 30    Social History History  Substance Use Topics  . Smoking status: Former Games developer  . Smokeless tobacco: Never Used  . Alcohol Use: No    No Known Allergies  Current Outpatient Prescriptions  Medication Sig Dispense Refill   . aspirin 81 MG tablet Take 81 mg by mouth daily.        . Coenzyme Q10 (CO Q 10) 100 MG CAPS Take 2 capsules by mouth daily.       . Garlic 100 MG TABS Take 1 tablet by mouth daily.       . Multiple Vitamins-Minerals (CENTRUM SILVER ULTRA WOMENS) TABS Take by mouth daily.      . simvastatin (ZOCOR) 40 MG tablet Take 1 tablet (40 mg total) by mouth at bedtime.  90 tablet  3  . solifenacin (VESICARE) 5 MG tablet Take 1 tablet (5 mg total) by mouth daily.  90 tablet  0  . vitamin B-12 (CYANOCOBALAMIN) 1000 MCG tablet Take 1,000 mcg by mouth daily.      . [DISCONTINUED] solifenacin (VESICARE) 5 MG tablet Take 1 tablet (5 mg total) by mouth daily.  90 tablet  3   No current facility-administered medications for this visit.    Review of Systems Review of Systems  Constitutional: Negative.   Respiratory: Negative.   Cardiovascular: Negative.     Blood pressure 124/78, pulse 76, resp. rate 12, height 5\' 3"  (1.6 m), weight 158 lb (71.668 kg).  Physical Exam Physical Exam  Constitutional: She is oriented to person, place, and time. She appears well-developed and well-nourished.  Neck: Neck supple.  Cardiovascular: Normal rate and regular rhythm.   Pulmonary/Chest: Effort normal and breath sounds normal. Right breast exhibits no inverted nipple, no mass, no nipple discharge, no skin change and no tenderness. Left breast exhibits no inverted nipple, no mass, no nipple discharge, no skin change and no tenderness.  Lymphadenopathy:    She has no cervical adenopathy.    She has no axillary adenopathy.  Neurological: She is alert and oriented to person, place, and time.  Skin: Skin is warm and dry.   Well healed incision upper outer quadrant right breast  Data Reviewed Right breast mammogram and ultrasound dated Aug 05, 2012 was reviewed. The breasts are essentially fatty replaced. No area of architectural distortion. No ultrasound abnormality. BI-RAD-2.    Assessment    Benign breast  exam, unremarkable mammogram and ultrasound.     Plan    The main area of concern at this time appears to be focal tenderness in the breast, but I cannot appreciate any palpable abnormality. The mammogram and ultrasound films were reviewed, with no abnormality identified.  We'll plan for a followup examination in 3 months just to be sure that no processes developing. If that exam is  Benign, she'll resume her annual screening mammograms.        Sheryl Miller 10/01/2012, 3:13 PM

## 2012-10-01 ENCOUNTER — Encounter: Payer: Self-pay | Admitting: General Surgery

## 2012-11-02 ENCOUNTER — Encounter: Payer: Self-pay | Admitting: Internal Medicine

## 2012-11-02 ENCOUNTER — Ambulatory Visit (INDEPENDENT_AMBULATORY_CARE_PROVIDER_SITE_OTHER): Payer: Medicare Other | Admitting: Internal Medicine

## 2012-11-02 VITALS — BP 110/60 | HR 62 | Temp 98.1°F | Ht 63.0 in | Wt 157.0 lb

## 2012-11-02 DIAGNOSIS — N393 Stress incontinence (female) (male): Secondary | ICD-10-CM

## 2012-11-02 DIAGNOSIS — Z0000 Encounter for general adult medical examination without abnormal findings: Secondary | ICD-10-CM

## 2012-11-02 DIAGNOSIS — E785 Hyperlipidemia, unspecified: Secondary | ICD-10-CM

## 2012-11-02 LAB — BASIC METABOLIC PANEL
BUN: 13 mg/dL (ref 6–23)
Creatinine, Ser: 0.6 mg/dL (ref 0.4–1.2)
GFR: 97.85 mL/min (ref >60.00)
Glucose, Bld: 67 mg/dL — ABNORMAL LOW (ref 70–99)

## 2012-11-02 LAB — CBC WITH DIFFERENTIAL/PLATELET
Basophils Absolute: 0 10*3/uL (ref 0.0–0.1)
Eosinophils Relative: 1.5 % (ref 0.0–5.0)
HCT: 38.3 % (ref 36.0–46.0)
Lymphs Abs: 1.8 10*3/uL (ref 0.7–4.0)
MCV: 87.7 fl (ref 78.0–100.0)
Monocytes Absolute: 0.5 10*3/uL (ref 0.1–1.0)
Monocytes Relative: 9.5 % (ref 3.0–12.0)
Neutrophils Relative %: 54.8 % (ref 43.0–77.0)
Platelets: 232 10*3/uL (ref 150.0–400.0)
RDW: 13.1 % (ref 11.5–14.6)
WBC: 5.4 10*3/uL (ref 4.5–10.5)

## 2012-11-02 LAB — LIPID PANEL
Cholesterol: 150 mg/dL (ref 0–200)
HDL: 65.6 mg/dL (ref >39.00)
LDL Cholesterol: 67 mg/dL (ref 0–99)
Triglycerides: 85 mg/dL (ref 0.0–149.0)
VLDL: 17 mg/dL (ref 0.0–40.0)

## 2012-11-02 LAB — TSH: TSH: 0.77 u[IU]/mL (ref 0.35–5.50)

## 2012-11-02 LAB — HEPATIC FUNCTION PANEL
Albumin: 3.9 g/dL (ref 3.5–5.2)
Total Bilirubin: 0.5 mg/dL (ref 0.3–1.2)

## 2012-11-02 NOTE — Assessment & Plan Note (Signed)
Healthy Trying to work on lifestyle UTD on all preventative interventions

## 2012-11-02 NOTE — Patient Instructions (Signed)
Please set up an appointment with Dr Patsy Lager to consider an injection in your right thumb. Please consider trying the program Tai Chi to help your balance

## 2012-11-02 NOTE — Progress Notes (Signed)
Subjective:    Patient ID: Sheryl Miller, female    DOB: [birth year], 77 y.o.   MRN: [id number]  HPI Here for physical Reviewed wellness paper work Did have fall when climbing 2 step ladder-- lost balance Recent breast exam UTD on colonoscopy and immunizations  Feels well now  She is having bad right thumb pain Tendon snaps Pain on extensor side also Uses mouse frequently---changed this  Current Outpatient Prescriptions on File Prior to Visit  Medication Sig Dispense Refill  . aspirin 81 MG tablet Take 81 mg by mouth daily.        . Coenzyme Q10 (CO Q 10) 100 MG CAPS Take 2 capsules by mouth daily.       . Garlic 100 MG TABS Take 1 tablet by mouth daily.       . Multiple Vitamins-Minerals (CENTRUM SILVER ULTRA WOMENS) TABS Take by mouth daily.      . simvastatin (ZOCOR) 40 MG tablet Take 1 tablet (40 mg total) by mouth at bedtime.  90 tablet  3  . solifenacin (VESICARE) 5 MG tablet Take 1 tablet (5 mg total) by mouth daily.  90 tablet  0  . vitamin B-12 (CYANOCOBALAMIN) 1000 MCG tablet Take 1,000 mcg by mouth daily.      . [DISCONTINUED] solifenacin (VESICARE) 5 MG tablet Take 1 tablet (5 mg total) by mouth daily.  90 tablet  3   No current facility-administered medications on file prior to visit.    No Known Allergies  Past Medical History  Diagnosis Date  . Raynaud's syndrome   . Dermatophytosis of nail   . Undiagnosed cardiac murmurs   . Osteoporosis, unspecified   . Diverticulosis of colon (without mention of hemorrhage)   . Female stress incontinence   . Internal hemorrhoids without mention of complication   . Unspecified constipation   . Esophageal reflux   . Other and unspecified hyperlipidemia   . Allergic rhinitis, cause unspecified   . Rheumatic fever   . Breast cyst     Past Surgical History  Procedure Laterality Date  . Bladder repair    . Abdominal hysterectomy    . Tonsillectomy    . Breast biopsy  2000    fibrocystic disease  . Shoulder  surgery       In 2006, bilaterally shoulder surgery for rotator cuff.  . Shoulder arthroscopy with rotator cuff repair and subacromial decompression  08/2001    R, Dorene Grebe  . Tubal ligation      Family History  Problem Relation Age of Onset  . Coronary artery disease Mother   . Heart disease Mother   . Diabetes Mother   . COPD Father   . Breast cancer Sister     2 (age 81?, age 82?)  . Diabetes Brother   . Breast cancer Maternal Aunt     2  . Stroke Paternal Grandmother   . Diabetes Brother   . Breast cancer Daughter     ? at age 50    History   Social History  . Marital Status: Married    Spouse Name: N/A    Number of Children: 4  . Years of Education: N/A   Occupational History  . retired Airline pilot    Social History Main Topics  . Smoking status: Former Games developer  . Smokeless tobacco: Never Used  . Alcohol Use: No  . Drug Use: No  . Sexual Activity: Not on file   Other Topics Concern  . Not on  file   Social History Narrative   Regular exercise-yes, walking or rides stationery bike daily      Has living will.    Husband should make health care decisions as needed, then son Molly Maduro.    Would accept resuscitation but no prolonged ventilation.    Would not want feeding tube if not cognitively aware               Review of Systems  Constitutional: Negative for fatigue and unexpected weight change.       Wears seat belt  HENT: Positive for rhinorrhea. Negative for hearing loss, congestion, dental problem and tinnitus.        Regular with dentist  Eyes: Negative for visual disturbance.       No diplopia or unilateral vision loss  Respiratory: Negative for cough, chest tightness and shortness of breath.   Cardiovascular: Positive for palpitations. Negative for chest pain and leg swelling.       Occ palpitations at various times (fast)--not during exercise. brief  Gastrointestinal: Negative for nausea, vomiting, abdominal pain, constipation and blood in stool.        No heartburn  Endocrine: Positive for cold intolerance. Negative for heat intolerance.       Long standing cold intolerance  Genitourinary: Positive for difficulty urinating. Negative for dyspareunia.       Has hesitancy Incontinence is controlled on the med  Musculoskeletal: Positive for myalgias and arthralgias. Negative for back pain.       Right thumb Occasional leg cramps  Allergic/Immunologic: Positive for environmental allergies. Negative for immunocompromised state.       No meds  Neurological: Negative for dizziness, syncope, weakness, light-headedness, numbness and headaches.  Hematological: Negative for adenopathy. Bruises/bleeds easily.  Psychiatric/Behavioral: Negative for sleep disturbance and dysphoric mood. The patient is not nervous/anxious.        Objective:   Physical Exam  Constitutional: She is oriented to person, place, and time. She appears well-developed and well-nourished. No distress.  HENT:  Head: Normocephalic and atraumatic.  Right Ear: External ear normal.  Left Ear: External ear normal.  Mouth/Throat: Oropharynx is clear and moist. No oropharyngeal exudate.  Upper plate  Eyes: Conjunctivae and EOM are normal. Pupils are equal, round, and reactive to light.  Neck: Normal range of motion. Neck supple. No thyromegaly present.  Cardiovascular: Normal rate, regular rhythm, normal heart sounds and intact distal pulses.  Exam reveals no gallop.   No murmur heard. Pulmonary/Chest: Effort normal and breath sounds normal. No respiratory distress. She has no wheezes. She has no rales.  Abdominal: Soft. There is no tenderness.  Musculoskeletal: She exhibits no edema and no tenderness.  Extensor click at left thumb DIP  Lymphadenopathy:    She has no cervical adenopathy.  Neurological: She is alert and oriented to person, place, and time.  Skin: No rash noted. No erythema.  Multiple small seb keratoses under breasts (probably too flat and small to be  reasonable to remove---she asked)  Psychiatric: She has a normal mood and affect. Her behavior is normal.          Assessment & Plan:

## 2012-11-02 NOTE — Assessment & Plan Note (Signed)
Known mild vascular disease so will continue

## 2012-11-02 NOTE — Assessment & Plan Note (Signed)
Satisfied with the vesicare Discussed that it can slow stream also---no changes made

## 2012-11-10 ENCOUNTER — Encounter: Payer: Self-pay | Admitting: Family Medicine

## 2012-11-10 ENCOUNTER — Ambulatory Visit (INDEPENDENT_AMBULATORY_CARE_PROVIDER_SITE_OTHER): Payer: Medicare Other | Admitting: Family Medicine

## 2012-11-10 VITALS — BP 90/58 | HR 65 | Temp 98.1°F | Ht 63.0 in | Wt 157.5 lb

## 2012-11-10 DIAGNOSIS — M6530 Trigger finger, unspecified finger: Secondary | ICD-10-CM

## 2012-11-10 DIAGNOSIS — M65311 Trigger thumb, right thumb: Secondary | ICD-10-CM

## 2012-11-10 NOTE — Progress Notes (Signed)
   Nature conservation officer at [facility] [address] [place] [address] Phone: [phone] Fax: [phone]  Date:  11/10/2012   Name:  [name]   DOB:  January 14, 1936   MRN:  [id number] Gender: female Age: 77 y.o.  Primary Physician:  Tillman Abide, MD  Evaluating MD: Hannah Beat, MD   Chief Complaint: thumb injection   History of Present Illness:  [name] is a 77 y.o. pleasant patient who presents with the following:  R thumb  Classic R trigger thumb  Proc:  Trigger Finger Injection, RIGHT Verbal consent was obtained. Risks (including rare risk of infection, potential risk for skin lightening and potential atrophy), benefits and alternatives were discussed. Prepped with Chloraprep and Ethyl Chloride used for anesthesia. Under sterile conditions, patient injected at flexion of tendon near MCP aiming distally with 45 degree angle towards nodule; injected directly into tendon sheath. Medication flowed freely without resistance.  Needle size: 22 gauge 1 1/2 inch Injection: 1/2 cc of Lidocaine 1% and 1/2 cc of Depo-Medrol 40 mg

## 2012-12-06 ENCOUNTER — Encounter: Payer: Self-pay | Admitting: Internal Medicine

## 2012-12-17 ENCOUNTER — Ambulatory Visit (INDEPENDENT_AMBULATORY_CARE_PROVIDER_SITE_OTHER): Payer: Medicare Other

## 2012-12-17 DIAGNOSIS — Z23 Encounter for immunization: Secondary | ICD-10-CM

## 2013-01-04 ENCOUNTER — Ambulatory Visit: Payer: Medicare Other | Admitting: General Surgery

## 2013-01-04 ENCOUNTER — Ambulatory Visit (INDEPENDENT_AMBULATORY_CARE_PROVIDER_SITE_OTHER): Payer: Medicare Other | Admitting: General Surgery

## 2013-01-04 ENCOUNTER — Encounter: Payer: Self-pay | Admitting: General Surgery

## 2013-01-04 VITALS — BP 110/68 | HR 64 | Resp 12 | Ht 63.0 in | Wt 157.0 lb

## 2013-01-04 DIAGNOSIS — N644 Mastodynia: Secondary | ICD-10-CM | POA: Insufficient documentation

## 2013-01-04 NOTE — Patient Instructions (Signed)
Continue self breast exams. Call office for any new breast issues or concerns. 

## 2013-01-04 NOTE — Progress Notes (Signed)
Patient ID: Sheryl Miller, female   DOB: 1935-12-03, 77 y.o.   MRN: [id number]  Chief Complaint  Patient presents with  . Follow-up    HPI Sheryl Miller is a 77 y.o. female.  who presents for a follow up breast evaluation.  She states she stopped caffeine and the right breast pain is much better. Patient does perform regular self breast checks and gets regular mammograms done.  Next mammogram is due Thursday.  HPI  Past Medical History  Diagnosis Date  . Raynaud's syndrome   . Dermatophytosis of nail   . Undiagnosed cardiac murmurs   . Osteoporosis, unspecified   . Diverticulosis of colon (without mention of hemorrhage)   . Female stress incontinence   . Internal hemorrhoids without mention of complication   . Unspecified constipation   . Esophageal reflux   . Other and unspecified hyperlipidemia   . Allergic rhinitis, cause unspecified   . Rheumatic fever   . Breast cyst     Past Surgical History  Procedure Laterality Date  . Bladder repair    . Abdominal hysterectomy    . Tonsillectomy    . Breast biopsy  2000    fibrocystic disease  . Shoulder surgery       In 2006, bilaterally shoulder surgery for rotator cuff.  . Shoulder arthroscopy with rotator cuff repair and subacromial decompression  08/2001    R, Dorene Grebe  . Tubal ligation      Family History  Problem Relation Age of Onset  . Coronary artery disease Mother   . Heart disease Mother   . Diabetes Mother   . COPD Father   . Breast cancer Sister     2 (age 12?, age 13?)  . Diabetes Brother   . Breast cancer Maternal Aunt     2  . Stroke Paternal Grandmother   . Diabetes Brother   . Breast cancer Daughter     ? at age 75    Social History History  Substance Use Topics  . Smoking status: Never Smoker   . Smokeless tobacco: Never Used  . Alcohol Use: No    No Known Allergies  Current Outpatient Prescriptions  Medication Sig Dispense Refill  . aspirin 81 MG tablet Take 81 mg by mouth  daily.        . Coenzyme Q-10 200 MG CAPS Take 1 capsule by mouth daily.      . COMBIGAN 0.2-0.5 % ophthalmic solution Place 1 drop into both eyes every 12 (twelve) hours.       . Garlic 100 MG TABS Take 1 tablet by mouth daily.       . Multiple Vitamins-Minerals (CENTRUM SILVER ULTRA WOMENS) TABS Take by mouth daily.      . simvastatin (ZOCOR) 40 MG tablet Take 1 tablet (40 mg total) by mouth at bedtime.  90 tablet  3  . solifenacin (VESICARE) 5 MG tablet Take 1 tablet (5 mg total) by mouth daily.  90 tablet  0  . vitamin B-12 (CYANOCOBALAMIN) 1000 MCG tablet Take 1,000 mcg by mouth daily.      . [DISCONTINUED] solifenacin (VESICARE) 5 MG tablet Take 1 tablet (5 mg total) by mouth daily.  90 tablet  3   No current facility-administered medications for this visit.    Review of Systems Review of Systems  Blood pressure 110/68, pulse 64, resp. rate 12, height 5\' 3"  (1.6 m), weight 157 lb (71.215 kg).  Physical Exam Physical Exam  Constitutional: She is  oriented to person, place, and time. She appears well-developed and well-nourished.  Neck: Neck supple.  Cardiovascular: Normal rate, regular rhythm and normal heart sounds.   Pulmonary/Chest: Effort normal and breath sounds normal. Right breast exhibits no inverted nipple, no mass, no nipple discharge, no skin change and no tenderness. Left breast exhibits no inverted nipple, no mass, no nipple discharge, no skin change and no tenderness.  Lymphadenopathy:    She has no cervical adenopathy.    She has no axillary adenopathy.  Neurological: She is alert and oriented to person, place, and time.  Skin: Skin is warm and dry.    Data Reviewed Aug 05, 2012 mammogram of the right breast was reviewed. BI-RAD-2. The patient is scheduled for bilateral mammograms later this week.  Assessment    Benign breast exam, resolution of previous mastalgia.     Plan    The patient will report any notable changes on her on self-exam. She was  encouraged to call should she develop any new breast problems.        [name] 01/04/2013, 8:03 PM

## 2013-01-06 ENCOUNTER — Ambulatory Visit: Payer: Self-pay | Admitting: Internal Medicine

## 2013-01-07 ENCOUNTER — Encounter: Payer: Self-pay | Admitting: Internal Medicine

## 2013-01-21 ENCOUNTER — Encounter: Payer: Self-pay | Admitting: Internal Medicine

## 2013-01-26 ENCOUNTER — Ambulatory Visit (INDEPENDENT_AMBULATORY_CARE_PROVIDER_SITE_OTHER): Payer: Medicare Other | Admitting: Internal Medicine

## 2013-01-26 ENCOUNTER — Encounter: Payer: Self-pay | Admitting: Internal Medicine

## 2013-01-26 ENCOUNTER — Ambulatory Visit: Payer: Self-pay | Admitting: Internal Medicine

## 2013-01-26 VITALS — BP 128/70 | HR 60 | Temp 98.0°F | Wt 154.0 lb

## 2013-01-26 DIAGNOSIS — R29818 Other symptoms and signs involving the nervous system: Secondary | ICD-10-CM

## 2013-01-26 DIAGNOSIS — R2689 Other abnormalities of gait and mobility: Secondary | ICD-10-CM | POA: Insufficient documentation

## 2013-01-26 NOTE — Assessment & Plan Note (Signed)
Doesn't seem to be vestibular or CNS related ?sensory changes in feet---does get burning but only with certain shoes Could be at least partially related to leg length discrepancy  Will set up evaluation by PT--not sure if ongoing treatment will be covered appt with Dr Patsy Lager to consider lift for right leg

## 2013-01-26 NOTE — Progress Notes (Signed)
Pre-visit discussion using our clinic review tool. No additional management support is needed unless otherwise documented below in the visit note.  

## 2013-01-26 NOTE — Patient Instructions (Signed)
Please set up an appointment with Dr Patsy Lager to evaluate your leg length and to decide if an orthotic would be appropriate

## 2013-01-26 NOTE — Progress Notes (Signed)
Subjective:    Patient ID: Sheryl Miller, female    DOB: 04-16-1935, 77 y.o.   MRN: [id number]  HPI Having balance problems for years--but now worse Feels like she falls backward when walking or standing still---able to correct without falling Feels her feet "quivering" Her sensation is normal in her feet  No arm or leg weakness Does have leg cramps--mostly at night (and in arch of foot)  Seeing chiropractor for her back Has known leg length discrepancy--- uses inserts but no lift  Current Outpatient Prescriptions on File Prior to Visit  Medication Sig Dispense Refill  . aspirin 81 MG tablet Take 81 mg by mouth daily.        . Coenzyme Q-10 200 MG CAPS Take 1 capsule by mouth daily.      . COMBIGAN 0.2-0.5 % ophthalmic solution Place 1 drop into both eyes every 12 (twelve) hours.       . Garlic 100 MG TABS Take 1 tablet by mouth daily.       . Multiple Vitamins-Minerals (CENTRUM SILVER ULTRA WOMENS) TABS Take by mouth daily.      . simvastatin (ZOCOR) 40 MG tablet Take 1 tablet (40 mg total) by mouth at bedtime.  90 tablet  3  . solifenacin (VESICARE) 5 MG tablet Take 1 tablet (5 mg total) by mouth daily.  90 tablet  0  . vitamin B-12 (CYANOCOBALAMIN) 1000 MCG tablet Take 1,000 mcg by mouth daily.      . [DISCONTINUED] solifenacin (VESICARE) 5 MG tablet Take 1 tablet (5 mg total) by mouth daily.  90 tablet  3   No current facility-administered medications on file prior to visit.    No Known Allergies  Past Medical History  Diagnosis Date  . Raynaud's syndrome   . Dermatophytosis of nail   . Undiagnosed cardiac murmurs   . Osteoporosis, unspecified   . Diverticulosis of colon (without mention of hemorrhage)   . Female stress incontinence   . Internal hemorrhoids without mention of complication   . Unspecified constipation   . Esophageal reflux   . Other and unspecified hyperlipidemia   . Allergic rhinitis, cause unspecified   . Rheumatic fever   . Breast cyst      Past Surgical History  Procedure Laterality Date  . Bladder repair    . Abdominal hysterectomy    . Tonsillectomy    . Breast biopsy  2000    fibrocystic disease  . Shoulder surgery       In 2006, bilaterally shoulder surgery for rotator cuff.  . Shoulder arthroscopy with rotator cuff repair and subacromial decompression  08/2001    R, Dorene Grebe  . Tubal ligation      Family History  Problem Relation Age of Onset  . Coronary artery disease Mother   . Heart disease Mother   . Diabetes Mother   . COPD Father   . Breast cancer Sister     2 (age 58?, age 36?)  . Diabetes Brother   . Breast cancer Maternal Aunt     2  . Stroke Paternal Grandmother   . Diabetes Brother   . Breast cancer Daughter     ? at age 97    History   Social History  . Marital Status: Married    Spouse Name: N/A    Number of Children: 4  . Years of Education: N/A   Occupational History  . retired Airline pilot    Social History Main Topics  . Smoking  status: Never Smoker   . Smokeless tobacco: Never Used  . Alcohol Use: No  . Drug Use: No  . Sexual Activity: Not on file   Other Topics Concern  . Not on file   Social History Narrative   Regular exercise-yes, walking or rides stationery bike daily      Has living will.    Husband should make health care decisions as needed, then son Molly Maduro.    Would accept resuscitation but no prolonged ventilation.    Would not want feeding tube if not cognitively aware               Review of Systems Had follow up mammogram-- looked okay. Just wanted 6 month follow up. No incontinence Doesn't notice any major memory problems    Objective:   Physical Exam  Constitutional: She appears well-developed and well-nourished. No distress.  Eyes:  No nystagmus  Musculoskeletal:  Slight asymmetry of hips-- ?right slightly higher No obvious scoliosis Normal ROM of hips Right leg seems very slightly shorter  Neurological: She has normal strength. She  displays no tremor. She exhibits normal muscle tone. She displays a negative Romberg sign. Coordination and gait normal.          Assessment & Plan:

## 2013-01-31 ENCOUNTER — Ambulatory Visit (INDEPENDENT_AMBULATORY_CARE_PROVIDER_SITE_OTHER): Payer: Medicare Other | Admitting: Family Medicine

## 2013-01-31 ENCOUNTER — Encounter: Payer: Self-pay | Admitting: Family Medicine

## 2013-01-31 VITALS — BP 100/54 | HR 66 | Temp 97.7°F | Ht 63.0 in | Wt 157.5 lb

## 2013-01-31 DIAGNOSIS — R29818 Other symptoms and signs involving the nervous system: Secondary | ICD-10-CM

## 2013-01-31 DIAGNOSIS — M2170 Unequal limb length (acquired), unspecified site: Secondary | ICD-10-CM

## 2013-01-31 DIAGNOSIS — R2689 Other abnormalities of gait and mobility: Secondary | ICD-10-CM

## 2013-01-31 DIAGNOSIS — M2140 Flat foot [pes planus] (acquired), unspecified foot: Secondary | ICD-10-CM

## 2013-01-31 NOTE — Patient Instructions (Signed)
Balance Exercises:  While brushing teeth, practice standing on 1 foot. Do for as long as you can, ideally 30 seconds to 1 minute each foot.   

## 2013-01-31 NOTE — Progress Notes (Signed)
Pre-visit discussion using our clinic review tool. No additional management support is needed unless otherwise documented below in the visit note.  

## 2013-01-31 NOTE — Progress Notes (Signed)
Date:  01/31/2013   Name:  Sheryl Miller   DOB:  01-04-1936   MRN:  [id number] Gender: female Age: 77 y.o.  Primary Physician:  Tillman Abide, MD   Chief Complaint: leg length discrepancy   Subjective:   History of Present Illness:  Sheryl Miller is a 77 y.o. very pleasant female patient who presents with the following:  Pleasant lady with a history of rheumatic fever who was seen at her class of my partner for evaluation of her leg length discrepancy and its relationship to her difficulties with balance and occasionally falling. She had a history of rheumatic fever as a child, she wore a cast on her right leg for least 6 weeks. Since then, she has had some restriction of motion at the ankle, and she has some altered gait.  She does have some pain and some intermittent pain in multiple areas of her body including both arms, forearms, and lower extremities due to palpation without any clear cause. Her primary complaint today in the question today is regarding her balance and whether or not correcting for a leg length discrepancy will help with this.  Balance and falling backwards. Leg length discrepancy. Had rhuematic fever.   Past Medical History, Surgical History, Social History, Family History, Problem List, Medications, and Allergies have been reviewed and updated if relevant.  Review of Systems:  GEN: No fevers, chills. Nontoxic. Primarily MSK c/o today. MSK: Detailed in the HPI GI: tolerating PO intake without difficulty Neuro: No numbness, parasthesias, or tingling associated. Otherwise the pertinent positives of the ROS are noted above.   Objective:   Physical Examination: BP 100/54  Pulse 66  Temp(Src) 97.7 F (36.5 C) (Oral)  Ht 5\' 3"  (1.6 m)  Wt 157 lb 8 oz (71.442 kg)  BMI 27.91 kg/m2   GEN: WDWN, NAD, Non-toxic, Alert & Oriented x 3 HEENT: Atraumatic, Normocephalic.  Ears and Nose: No external deformity. EXTR: No clubbing/cyanosis/edema PSYCH:  Normally interactive. Conversant. Not depressed or anxious appearing.  Calm demeanor.   FEET: b Echymosis: no Edema: no ROM: 50% loss of motion at R ankle plantar and dorsiflexion Gait: heel toe, non-antalgic, R foot outward turned. Pronation. MT pain: no Lateral Mall: NT Medial Mall: NT Talus: NT Navicular: NT Cuboid: NT Calcaneous: NT Metatarsals: NT 5th MT: NT Phalanges: NT Achilles: NT Plantar Fascia: NT Fat Pad: NT Peroneals: NT Post Tib: NT Great Toe: Nml motion Ant Drawer: neg ATFL: NT CFL: NT Deltoid: NT Long arch: pes planus Transverse arch: moderate breakdown Hindfoot breakdown: none Sensation: intact   Radiology: No results found.  Assessment & Plan:    Balance problem  UNEQUAL LEG LENGTH  PES PLANUS  >25 minutes spent in face to face time with patient, >50% spent in counselling or coordination of care: The patient has an approximate 1 cm leg length discrepancy with the right leg being shorter. I suspect that balance training will make a bigger influence long-term. She is due to start physical therapy tomorrow, which I think is a great idea. Also encouraged her to be physically active in any way that she could.  I made a 8 mm lift to go in her shoes. I placed 1 and a shoe that she likes very much. Also placed another lift on per sports and soles as she can put in her other shoes. They seemed to feel comfortable to her in the office.  Patient Instructions  Balance Exercises:  While brushing teeth, practice standing on 1 foot. Do for  as long as you can, ideally 30 seconds to 1 minute each foot.        Signed,  Elpidio Galea. Adyson Vanburen, MD, CAQ Sports Medicine  [facility] [address] [place] [address] Phone: [phone] Fax: [phone]  Updated Complete Medication List:   Medication List       This list is accurate as of: 01/31/13  6:21 PM.  Always use your most recent med list.               aspirin 81 MG  tablet  Take 81 mg by mouth daily.     CENTRUM SILVER ULTRA WOMENS Tabs  Take by mouth daily.     Coenzyme Q-10 200 MG Caps  Take 1 capsule by mouth daily.     COMBIGAN 0.2-0.5 % ophthalmic solution  Generic drug:  brimonidine-timolol  Place 1 drop into both eyes every 12 (twelve) hours.     Garlic 100 MG Tabs  Take 1 tablet by mouth daily.     simvastatin 40 MG tablet  Commonly known as:  ZOCOR  Take 1 tablet (40 mg total) by mouth at bedtime.     solifenacin 5 MG tablet  Commonly known as:  VESICARE  Take 1 tablet (5 mg total) by mouth daily.     vitamin B-12 1000 MCG tablet  Commonly known as:  CYANOCOBALAMIN  Take 1,000 mcg by mouth daily.

## 2013-02-01 ENCOUNTER — Telehealth: Payer: Self-pay | Admitting: *Deleted

## 2013-02-01 NOTE — Telephone Encounter (Signed)
Spoke with patient to advise that Dr. Alphonsus Sias is out of the office and that he would have to ok the biopsy and per pt her sister and daughter both had breast cancer.

## 2013-02-01 NOTE — Telephone Encounter (Signed)
Pt went for her additional views for her mammogram and it was recommended to repeat the ultrasound in 6 mths, but pt called to [facility] demanding a biopsy be done ASAP she does not want to wait 6 mths. Please advise

## 2013-02-03 NOTE — Telephone Encounter (Signed)
It doesn't appear that a biopsy is indicated If she would like, I can send her to a surgeon for their review and to determine whether a biopsy is warranted. If so, find out if she wants [place] or [place]

## 2013-02-04 ENCOUNTER — Encounter: Payer: Self-pay | Admitting: Internal Medicine

## 2013-02-07 NOTE — Telephone Encounter (Signed)
Pt will wait 6 mth and have it checked again.

## 2013-02-18 ENCOUNTER — Telehealth: Payer: Self-pay | Admitting: Internal Medicine

## 2013-02-18 NOTE — Telephone Encounter (Signed)
Patient Information:  Caller Name: Matisyn  Phone: [phone]  Patient: Yavonne, Kiss  Gender: Female  DOB: June 18, 1935  Age: 77 Years  PCP: Tillman Abide [facility])  Office Follow Up:  Does the office need to follow up with this patient?: No  Instructions For The Office: N/A  RN Note:  Has Appt in office on Mon 12/15.  Symptoms  Reason For Call & Symptoms: Something started to feel like needles sticking in back at one spot on mid to uipper back on Lt side.  Thinks may be a mole but unable to see area  Reviewed Health History In EMR: Yes  Reviewed Medications In EMR: Yes  Reviewed Allergies In EMR: Yes  Reviewed Surgeries / Procedures: Yes  Date of Onset of Symptoms: 02/12/2013  Treatments Tried: bandaid  Treatments Tried Worked: No  Guideline(s) Used:  Skin Lesion - Moles or Growths  Disposition Per Guideline:   See Today or Tomorrow in Office  Reason For Disposition Reached:   Caller cannot describe it clearly  Advice Given:  Call Back If:  Fever or pain occurs  Any change in the mole or growth  You become worse.  Patient Will Follow Care Advice:  YES

## 2013-02-18 NOTE — Telephone Encounter (Signed)
noted 

## 2013-02-21 ENCOUNTER — Ambulatory Visit (INDEPENDENT_AMBULATORY_CARE_PROVIDER_SITE_OTHER): Payer: Medicare Other | Admitting: Internal Medicine

## 2013-02-21 ENCOUNTER — Encounter: Payer: Self-pay | Admitting: Internal Medicine

## 2013-02-21 VITALS — BP 108/60 | HR 60 | Temp 98.2°F | Wt 157.0 lb

## 2013-02-21 DIAGNOSIS — L909 Atrophic disorder of skin, unspecified: Secondary | ICD-10-CM

## 2013-02-21 DIAGNOSIS — L918 Other hypertrophic disorders of the skin: Secondary | ICD-10-CM | POA: Insufficient documentation

## 2013-02-21 NOTE — Assessment & Plan Note (Signed)
Partially infarcted and painful Liquid nitrogen done---35 seconds x 2  To allow for the base also to lose perfusion At the end--the tag did fall off

## 2013-02-21 NOTE — Progress Notes (Signed)
Subjective:    Patient ID: Sheryl Miller, female    DOB: [birth year], 77 y.o.   MRN: [id number]  HPI Recent visit to urgent care for back pain Got diazepam and hydrocodone Still hurts some Thought she was worse with PT---stopped it (back pain happened after being stretched out) Now seeing chiropractor  Has a painful lump on upper left back Husband has kept it covered Not sure about drainage  Current Outpatient Prescriptions on File Prior to Visit  Medication Sig Dispense Refill  . aspirin 81 MG tablet Take 81 mg by mouth daily.        . Coenzyme Q-10 200 MG CAPS Take 1 capsule by mouth daily.      . COMBIGAN 0.2-0.5 % ophthalmic solution Place 1 drop into both eyes every 12 (twelve) hours.       . Garlic 100 MG TABS Take 1 tablet by mouth daily.       . Multiple Vitamins-Minerals (CENTRUM SILVER ULTRA WOMENS) TABS Take by mouth daily.      . simvastatin (ZOCOR) 40 MG tablet Take 1 tablet (40 mg total) by mouth at bedtime.  90 tablet  3  . solifenacin (VESICARE) 5 MG tablet Take 1 tablet (5 mg total) by mouth daily.  90 tablet  0  . vitamin B-12 (CYANOCOBALAMIN) 1000 MCG tablet Take 1,000 mcg by mouth daily.      . [DISCONTINUED] solifenacin (VESICARE) 5 MG tablet Take 1 tablet (5 mg total) by mouth daily.  90 tablet  3   No current facility-administered medications on file prior to visit.    No Known Allergies  Past Medical History  Diagnosis Date  . Raynaud's syndrome   . Dermatophytosis of nail   . Undiagnosed cardiac murmurs   . Osteoporosis, unspecified   . Diverticulosis of colon (without mention of hemorrhage)   . Female stress incontinence   . Internal hemorrhoids without mention of complication   . Unspecified constipation   . Esophageal reflux   . Other and unspecified hyperlipidemia   . Allergic rhinitis, cause unspecified   . Rheumatic fever   . Breast cyst     Past Surgical History  Procedure Laterality Date  . Bladder repair    . Abdominal  hysterectomy    . Tonsillectomy    . Breast biopsy  2000    fibrocystic disease  . Shoulder surgery       In 2006, bilaterally shoulder surgery for rotator cuff.  . Shoulder arthroscopy with rotator cuff repair and subacromial decompression  08/2001    R, Dorene Grebe  . Tubal ligation      Family History  Problem Relation Age of Onset  . Coronary artery disease Mother   . Heart disease Mother   . Diabetes Mother   . COPD Father   . Breast cancer Sister     2 (age 71?, age 26?)  . Diabetes Brother   . Breast cancer Maternal Aunt     2  . Stroke Paternal Grandmother   . Diabetes Brother   . Breast cancer Daughter     ? at age 25    History   Social History  . Marital Status: Married    Spouse Name: N/A    Number of Children: 4  . Years of Education: N/A   Occupational History  . retired Airline pilot    Social History Main Topics  . Smoking status: Never Smoker   . Smokeless tobacco: Never Used  . Alcohol Use:  No  . Drug Use: No  . Sexual Activity: Not on file   Other Topics Concern  . Not on file   Social History Narrative   Regular exercise-yes, walking or rides stationery bike daily      Has living will.    Husband should make health care decisions as needed, then son Molly Maduro.    Would accept resuscitation but no prolonged ventilation.    Would not want feeding tube if not cognitively aware               Review of Systems No fever Doesn't feel sick    Objective:   Physical Exam  Constitutional: She appears well-developed. No distress.  Skin:  Partially infarcted skin tag on left back ~T4-5 Tender but not especially inflamed          Assessment & Plan:

## 2013-03-10 ENCOUNTER — Encounter: Payer: Self-pay | Admitting: Internal Medicine

## 2013-03-10 ENCOUNTER — Other Ambulatory Visit: Payer: Self-pay | Admitting: Internal Medicine

## 2013-03-11 ENCOUNTER — Other Ambulatory Visit: Payer: Self-pay | Admitting: *Deleted

## 2013-03-11 MED ORDER — SOLIFENACIN SUCCINATE 5 MG PO TABS: 5.0000 mg | ORAL_TABLET | Freq: Every day | ORAL | Status: DC

## 2013-03-11 MED ORDER — SIMVASTATIN 40 MG PO TABS: 40.0000 mg | ORAL_TABLET | Freq: Every day | ORAL | Status: DC

## 2013-03-11 NOTE — Telephone Encounter (Signed)
p request status of refills from [facility]; advised pt done.

## 2013-03-14 ENCOUNTER — Encounter: Payer: Self-pay | Admitting: Internal Medicine

## 2013-03-23 ENCOUNTER — Encounter: Payer: Self-pay | Admitting: Internal Medicine

## 2013-03-23 DIAGNOSIS — M2170 Unequal limb length (acquired), unspecified site: Secondary | ICD-10-CM

## 2013-05-13 ENCOUNTER — Encounter: Payer: Self-pay | Admitting: Family Medicine

## 2013-05-13 ENCOUNTER — Ambulatory Visit: Payer: Medicare Other | Admitting: Family Medicine

## 2013-05-13 ENCOUNTER — Ambulatory Visit (INDEPENDENT_AMBULATORY_CARE_PROVIDER_SITE_OTHER): Payer: Commercial Managed Care - HMO | Admitting: Family Medicine

## 2013-05-13 VITALS — BP 120/62 | HR 60 | Temp 97.7°F | Ht 63.0 in | Wt 157.0 lb

## 2013-05-13 DIAGNOSIS — M79602 Pain in left arm: Secondary | ICD-10-CM

## 2013-05-13 DIAGNOSIS — M79609 Pain in unspecified limb: Secondary | ICD-10-CM

## 2013-05-13 MED ORDER — DICLOFENAC SODIUM 75 MG PO TBEC: 75.0000 mg | DELAYED_RELEASE_TABLET | Freq: Two times a day (BID) | ORAL | Status: DC

## 2013-05-13 NOTE — Progress Notes (Signed)
Pre visit review using our clinic review tool, if applicable. No additional management support is needed unless otherwise documented below in the visit note. 

## 2013-05-13 NOTE — Patient Instructions (Signed)
Gentle range of motion exercises. Start  twice daily antiinflammatory diclofenac. If not improving in 2 weeks, follow up. Call sooner if no relief.

## 2013-05-13 NOTE — Progress Notes (Signed)
   Subjective:    Patient ID: Sheryl Miller, female    DOB: January 08, 1936, 78 y.o.   MRN: [id number]  Arm Pain  Pertinent negatives include no chest pain.   78 year old female  presents with  left arm pain starting after doing push ups on floor 2 weeks ago. No click or pop. No fall. 1 day after push ups she noted numbing pain in left arm.  Apin is worsening now.  She has pain in left shoulder pain, pain worst as radiates down to forearm.  Pain is severe woke her up this AM, hard to sleep at night.  No change in pain with movement. No numbness or tingling in arm or hand. Not dropping anything, no weakness.   She see chiropractor.  Tried hydrocodone and gabapentin 100 mg.  No relief with advil or tyelnol. No relief  Has history of low back pain, hx of B rotator cuff surgeries. Review of Systems  Constitutional: Negative for fever and fatigue.  HENT: Negative for ear pain.   Eyes: Negative for pain.  Respiratory: Negative for chest tightness and shortness of breath.   Cardiovascular: Negative for chest pain, palpitations and leg swelling.  Gastrointestinal: Negative for abdominal pain.  Genitourinary: Negative for dysuria.       Objective:   Physical Exam  Constitutional: Vital signs are normal. She appears well-developed and well-nourished. She is cooperative.  Non-toxic appearance. She does not appear ill. No distress.  HENT:  Head: Normocephalic.  Right Ear: Hearing, tympanic membrane, external ear and ear canal normal. Tympanic membrane is not erythematous, not retracted and not bulging.  Left Ear: Hearing, tympanic membrane, external ear and ear canal normal. Tympanic membrane is not erythematous, not retracted and not bulging.  Nose: No mucosal edema or rhinorrhea. Right sinus exhibits no maxillary sinus tenderness and no frontal sinus tenderness. Left sinus exhibits no maxillary sinus tenderness and no frontal sinus tenderness.  Mouth/Throat: Uvula is midline, oropharynx is  clear and moist and mucous membranes are normal.  Eyes: Conjunctivae, EOM and lids are normal. Pupils are equal, round, and reactive to light. Lids are everted and swept, no foreign bodies found.  Neck: Trachea normal and normal range of motion. Neck supple. Carotid bruit is not present. No mass and no thyromegaly present.  Cardiovascular: Normal rate, regular rhythm, S1 normal, S2 normal, normal heart sounds, intact distal pulses and normal pulses.  Exam reveals no gallop and no friction rub.   No murmur heard. Pulmonary/Chest: Effort normal and breath sounds normal. Not tachypneic. No respiratory distress. She has no decreased breath sounds. She has no wheezes. She has no rhonchi. She has no rales.  Abdominal: Soft. Normal appearance and bowel sounds are normal. There is no tenderness.  Musculoskeletal:       Right shoulder: Normal.       Left shoulder: She exhibits decreased range of motion and tenderness. She exhibits no bony tenderness and no deformity.       Left elbow: Normal.       Cervical back: Normal.  Neg spurling's  Neurological: She is alert.  Skin: Skin is warm, dry and intact. No rash noted.  Psychiatric: Her speech is normal and behavior is normal. Judgment and thought content normal. Her mood appears not anxious. Cognition and memory are normal. She does not exhibit a depressed mood.          Assessment & Plan:

## 2013-05-16 ENCOUNTER — Telehealth: Payer: Self-pay

## 2013-05-16 ENCOUNTER — Encounter: Payer: Self-pay | Admitting: Internal Medicine

## 2013-05-16 NOTE — Telephone Encounter (Signed)
Pt seen 05/13/13 and is no better; left back pain and lt arm hurting with fingers tingling, pt said feels the same as when was seen 05/13/13. Diclofenac is not helping and pt request pain med (muscle relaxer) to [facility]. Pt request cb when med called in.Please advise.

## 2013-05-16 NOTE — Telephone Encounter (Signed)
Will send to Dr Diona Browner since I am not sure what is the best option and she saw her In general we try to avoid muscle relaxers at her age---but a brief course may be okay  Let her know that Dr Diona Browner should make a decision about this by sometime tomorrow (if not today)

## 2013-05-17 MED ORDER — CYCLOBENZAPRINE HCL 10 MG PO TABS: 10.0000 mg | ORAL_TABLET | Freq: Every evening | ORAL | Status: DC | PRN

## 2013-06-02 ENCOUNTER — Encounter: Payer: Self-pay | Admitting: Internal Medicine

## 2013-06-13 NOTE — Assessment & Plan Note (Signed)
Likely rotator cuff issue. Treat with heat/ice, NSAIDs. Home pt.  Follow up if not improving.

## 2013-07-07 ENCOUNTER — Encounter: Payer: Self-pay | Admitting: Internal Medicine

## 2013-07-11 ENCOUNTER — Encounter: Payer: Self-pay | Admitting: Internal Medicine

## 2013-07-11 ENCOUNTER — Other Ambulatory Visit: Payer: Self-pay | Admitting: Internal Medicine

## 2013-07-11 DIAGNOSIS — N6009 Solitary cyst of unspecified breast: Secondary | ICD-10-CM

## 2013-08-02 ENCOUNTER — Ambulatory Visit: Payer: Self-pay | Admitting: Internal Medicine

## 2013-08-03 ENCOUNTER — Encounter: Payer: Self-pay | Admitting: Internal Medicine

## 2013-08-09 ENCOUNTER — Encounter: Payer: Self-pay | Admitting: Internal Medicine

## 2013-08-16 ENCOUNTER — Encounter: Payer: Self-pay | Admitting: Internal Medicine

## 2013-08-16 ENCOUNTER — Ambulatory Visit (INDEPENDENT_AMBULATORY_CARE_PROVIDER_SITE_OTHER): Payer: Commercial Managed Care - HMO | Admitting: Internal Medicine

## 2013-08-16 VITALS — BP 108/60 | HR 68 | Temp 97.8°F | Wt 153.0 lb

## 2013-08-16 DIAGNOSIS — R5383 Other fatigue: Secondary | ICD-10-CM | POA: Insufficient documentation

## 2013-08-16 DIAGNOSIS — R5381 Other malaise: Secondary | ICD-10-CM

## 2013-08-16 LAB — TSH: TSH: 0.31 u[IU]/mL — ABNORMAL LOW (ref 0.35–4.50)

## 2013-08-16 LAB — COMPREHENSIVE METABOLIC PANEL
ALBUMIN: 3.9 g/dL (ref 3.5–5.2)
ALK PHOS: 56 U/L (ref 39–117)
ALT: 20 U/L (ref 0–35)
AST: 26 U/L (ref 0–37)
BUN: 11 mg/dL (ref 6–23)
CO2: 27 mEq/L (ref 19–32)
Calcium: 9.4 mg/dL (ref 8.4–10.5)
Chloride: 101 mEq/L (ref 96–112)
Creatinine, Ser: 0.7 mg/dL (ref 0.4–1.2)
GFR: 89.4 mL/min (ref >60.00)
Glucose, Bld: 74 mg/dL (ref 70–99)
Potassium: 4.3 mEq/L (ref 3.5–5.1)
SODIUM: 134 meq/L — AB (ref 135–145)
Total Bilirubin: 0.4 mg/dL (ref 0.2–1.2)
Total Protein: 6.7 g/dL (ref 6.0–8.3)

## 2013-08-16 LAB — CBC WITH DIFFERENTIAL/PLATELET
BASOS ABS: 0 10*3/uL (ref 0.0–0.1)
BASOS PCT: 0.8 % (ref 0.0–3.0)
EOS ABS: 0.1 10*3/uL (ref 0.0–0.7)
Eosinophils Relative: 1.9 % (ref 0.0–5.0)
HCT: 37.9 % (ref 36.0–46.0)
HEMOGLOBIN: 12.7 g/dL (ref 12.0–15.0)
Lymphocytes Relative: 42.9 % (ref 12.0–46.0)
Lymphs Abs: 1.9 10*3/uL (ref 0.7–4.0)
MCHC: 33.5 g/dL (ref 30.0–36.0)
MCV: 89.6 fl (ref 78.0–100.0)
MONO ABS: 0.5 10*3/uL (ref 0.1–1.0)
Monocytes Relative: 10.5 % (ref 3.0–12.0)
NEUTROS ABS: 1.9 10*3/uL (ref 1.4–7.7)
Neutrophils Relative %: 43.9 % (ref 43.0–77.0)
Platelets: 224 10*3/uL (ref 150.0–400.0)
RBC: 4.24 Mil/uL (ref 3.87–5.11)
RDW: 13.4 % (ref 11.5–15.5)
WBC: 4.3 10*3/uL (ref 4.0–10.5)

## 2013-08-16 LAB — SEDIMENTATION RATE: Sed Rate: 31 mm/hr — ABNORMAL HIGH (ref 0–22)

## 2013-08-16 LAB — T4, FREE: Free T4: 0.97 ng/dL (ref 0.60–1.60)

## 2013-08-16 NOTE — Assessment & Plan Note (Signed)
Vague Exercising daily Not getting enough sleep---but seems to be restorative May be related to the allergy pills  Will have her switch the allergy meds to bedtime (and try just one) Will check labs No worrisome findings on PE--no nodes, etc

## 2013-08-16 NOTE — Progress Notes (Signed)
Pre visit review using our clinic review tool, if applicable. No additional management support is needed unless otherwise documented below in the visit note. 

## 2013-08-16 NOTE — Patient Instructions (Signed)
Please take the allergy meds at night---and see if one is enough. If you feel more comfortable with stroke prevention with an aspirin, you can take 81 mg every other day. If you are not feeling better in the next 2 weeks, you can try stopping the simvastatin for a few weeks--to see if that makes you feel better
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
## 2013-09-16 ENCOUNTER — Encounter: Payer: Self-pay | Admitting: Internal Medicine

## 2013-09-16 ENCOUNTER — Ambulatory Visit (INDEPENDENT_AMBULATORY_CARE_PROVIDER_SITE_OTHER): Payer: Commercial Managed Care - HMO | Admitting: Internal Medicine

## 2013-09-16 VITALS — BP 140/80 | HR 58 | Temp 98.6°F | Wt 152.0 lb

## 2013-09-16 DIAGNOSIS — M949 Disorder of cartilage, unspecified: Secondary | ICD-10-CM

## 2013-09-16 DIAGNOSIS — M898X1 Other specified disorders of bone, shoulder: Secondary | ICD-10-CM

## 2013-09-16 DIAGNOSIS — M899 Disorder of bone, unspecified: Secondary | ICD-10-CM

## 2013-09-16 MED ORDER — HYDROCODONE-ACETAMINOPHEN 5-325 MG PO TABS: 1.0000 | ORAL_TABLET | Freq: Four times a day (QID) | ORAL | Status: DC | PRN
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
## 2013-09-16 NOTE — Progress Notes (Signed)
Pre visit review using our clinic review tool, if applicable. No additional management support is needed unless otherwise documented below in the visit note. 

## 2013-09-16 NOTE — Assessment & Plan Note (Signed)
And along left triceps No PE findings Nothing to suggest lung infection or pulm embolus (despite recent prolonged trip) Not consistent with coronary ischemia Actually feels better when she lies on it or puts pressure on it  Seems like it must be muscular Discussed supportive care ?eval by ortho or Dr Lorelei Pont

## 2013-09-19 ENCOUNTER — Encounter: Payer: Self-pay | Admitting: Family Medicine

## 2013-09-19 ENCOUNTER — Ambulatory Visit (INDEPENDENT_AMBULATORY_CARE_PROVIDER_SITE_OTHER): Payer: Commercial Managed Care - HMO | Admitting: Family Medicine

## 2013-09-19 VITALS — BP 136/80 | HR 78 | Temp 97.6°F | Ht 63.0 in | Wt 150.0 lb

## 2013-09-19 DIAGNOSIS — M5412 Radiculopathy, cervical region: Secondary | ICD-10-CM

## 2013-09-19 MED ORDER — METHYLPREDNISOLONE ACETATE 80 MG/ML IJ SUSP
80.0000 mg | Freq: Once | INTRAMUSCULAR | Status: AC
Start: 2013-09-19 — End: 2013-09-19
  Administered 2013-09-19: 80 mg via INTRAMUSCULAR

## 2013-09-19 MED ORDER — AMITRIPTYLINE HCL 25 MG PO TABS: 25.0000 mg | ORAL_TABLET | Freq: Every day | ORAL | Status: DC

## 2013-09-19 MED ORDER — PREDNISONE 20 MG PO TABS: ORAL_TABLET | ORAL | Status: DC

## 2013-09-19 NOTE — Progress Notes (Signed)
Pre visit review using our clinic review tool, if applicable. No additional management support is needed unless otherwise documented below in the visit note. 

## 2013-09-19 NOTE — Progress Notes (Signed)
[address] [place] [address] Phone: [phone] Fax: [phone]  Patient ID: Sheryl Miller MRN: [id number], DOB: 07-13-1935, 78 y.o. Date of Encounter: 09/19/2013  Primary Physician:  Tillman Abide, MD   Chief Complaint: Shoulder Pain   Subjective:   History of Present Illness:  Sheryl Miller is a 78 y.o. very pleasant female patient who presents with the following:  Acute radiculopathy. No numbness or tingling . All started last Wednesday.  Just came back from a trip. Multiple problems went on during this trip, and they were up for 48 hours due to problems with train connections and train travel.  She primarily has pain in the shoulder blade and scapular region, and she'll set pain that travels down the posterior aspect of her arm all the way down through the entirety of the upper extremity and through the lower forearm to the hand. She does not really describe tingling or numbness, but she does have pain and goes throughout its entire region. Her neck itself does not feel that poorly, she has good range of motion.  Had to stay in the [place] train station. Did not sleep for 2 days.   She saw my partner 3 days ago and was given Vicodin, which hasn't helped too much. She is having difficulty sleeping, too.  Right now she is in a significant amount of pain, and the patient appears distraught.  Past Medical History, Surgical History, Social History, Family History, Problem List, Medications, and Allergies have been reviewed and updated if relevant.  Review of Systems:  GEN: No fevers, chills. Nontoxic. Primarily MSK c/o today. MSK: Detailed in the HPI GI: tolerating PO intake without difficulty Neuro: as above Otherwise the pertinent positives of the ROS are noted above.   Objective:   Physical Examination: BP 136/80  Pulse 78  Temp(Src) 97.6 F (36.4 C) (Oral)  Ht 5\' 3"  (1.6 m)  Wt 150 lb (68.04 kg)  BMI 26.58 kg/m2   GEN:  Well-developed,well-nourished,in no acute distress; alert,appropriate and cooperative throughout examination HEENT: Normocephalic and atraumatic without obvious abnormalities. Ears, externally no deformities PULM: Breathing comfortably in no respiratory distress EXT: No clubbing, cyanosis, or edema PSYCH: Normally interactive. Cooperative during the interview. Pleasant. Friendly and conversant. Not anxious or depressed appearing. Normal, full affect.  CERVICAL SPINE EXAM Range of motion: Flexion, extension, lateral bending, and rotation: minimal loss of motion, approximate 5-10 percent Pain with terminal motion: mild only Spinous Processes: NT SCM: NT Upper paracervical muscles: mild ttp Upper traps: NT C5-T1 intact, sensation and motor   Radiology: No results found.  Assessment & Plan:   Cervical radiculopathy - Plan: methylPREDNISolone acetate (DEPO-MEDROL) injection 80 mg  Clinical history driving diagnosis. Scapular pain with radicular pain down the arm, more likely lower cervical disc herniation, or possible osteophyte causing nerve encroachment on the LEFT. No red flags, neurovascularly intact.  More probable, relief of symptoms over the next 2-3 weeks. Follow up with me in 3-4 weeks.  Longer prednisone taper, 14 days. Elavil at nighttime for sleep and neuropathic pain modulation.  New Prescriptions   AMITRIPTYLINE (ELAVIL) 25 MG TABLET    Take 1 tablet (25 mg total) by mouth at bedtime.   PREDNISONE (DELTASONE) 20 MG TABLET    2 tabs po for 1 week, then 1 tab po   Modified Medications   No medications on file   No orders of the defined types were placed in this encounter.   Follow-up: Return in about 1 month (around 10/20/2013). Unless  noted above, the patient is to follow-up if symptoms worsen. Red flags were reviewed with the patient.  Signed,  Elpidio Galea. Oriya Kettering, MD, CA[facility] Sports Medicine   Discontinued Medications   No medications on file   Current Medications  at Discharge:   Medication List       This list is accurate as of: 09/19/13 11:59 PM.  Always use your most recent med list.               amitriptyline 25 MG tablet  Commonly known as:  ELAVIL  Take 1 tablet (25 mg total) by mouth at bedtime.     CENTRUM SILVER ULTRA WOMENS Tabs  Take by mouth daily.     COMBIGAN 0.2-0.5 % ophthalmic solution  Generic drug:  brimonidine-timolol  Place 1 drop into both eyes every 12 (twelve) hours.     Garlic 100 MG Tabs  Take 1 tablet by mouth daily.     HYDROcodone-acetaminophen 5-325 MG per tablet  Commonly known as:  NORCO/VICODIN  Take 1 tablet by mouth every 6 (six) hours as needed for moderate pain.     predniSONE 20 MG tablet  Commonly known as:  DELTASONE  2 tabs po for 1 week, then 1 tab po     simvastatin 40 MG tablet  Commonly known as:  ZOCOR  Take 1 tablet (40 mg total) by mouth at bedtime.     vitamin B-12 1000 MCG tablet  Commonly known as:  CYANOCOBALAMIN  Take 1,000 mcg by mouth daily.

## 2013-09-20 ENCOUNTER — Telehealth: Payer: Self-pay

## 2013-09-20 NOTE — Telephone Encounter (Signed)
Sheryl Miller notified form with dosage clarification has been faxed so he should be able to pick up prescription later on today.

## 2013-09-20 NOTE — Telephone Encounter (Signed)
Thank-you. This is how I typically write 14d prednisone.

## 2013-09-20 NOTE — Telephone Encounter (Signed)
Prescription should be 2 tabs po for 1 week, then 1 tab po for 1 week.  Request faxed back to [facility] at [phone] with dosage clarification.

## 2013-09-20 NOTE — Telephone Encounter (Signed)
Mr Kirley said [facility] will not fill Prednisone until gets clarification of instructions. [facility] has not opened yet but [facility] already faxed a request of how to finish prednisone. Instructions read 2 tabs PO for 1 week, then 1 tab po. Mr Baune request cb when resolved. Fax in Dr Lillie Fragmin in box.

## 2013-10-18 ENCOUNTER — Encounter: Payer: Self-pay | Admitting: Internal Medicine

## 2013-10-24 ENCOUNTER — Ambulatory Visit (INDEPENDENT_AMBULATORY_CARE_PROVIDER_SITE_OTHER): Payer: Commercial Managed Care - HMO | Admitting: Family Medicine

## 2013-10-24 ENCOUNTER — Encounter: Payer: Self-pay | Admitting: Family Medicine

## 2013-10-24 VITALS — BP 118/66 | HR 71 | Temp 98.3°F | Ht 63.0 in | Wt 153.5 lb

## 2013-10-24 DIAGNOSIS — M5412 Radiculopathy, cervical region: Secondary | ICD-10-CM

## 2013-10-24 NOTE — Progress Notes (Signed)
Pre visit review using our clinic review tool, if applicable. No additional management support is needed unless otherwise documented below in the visit note. 

## 2013-10-24 NOTE — Progress Notes (Signed)
[address] [place] [address] Phone: [phone] Fax: [phone]  Patient ID: Sheryl Miller MRN: [id number], DOB: 09-26-1935, 78 y.o. Date of Encounter: 10/24/2013  Primary Physician:  Tillman Abide, MD   Chief Complaint: Follow-up   Subjective:   History of Present Illness:  Sheryl Miller is a 78 y.o. very pleasant female patient who presents with the following:  Details of the initial history or below, and the patient is here in followup several weeks after her initial office visit. She is doing much much better. She has basically no symptoms and no radiculopathy at this point her symptoms started to resolve up 2 weeks after her office visit with me. She do a 14 day prednisone taper. Also gave her some Elavil to take, which helped her to sleep some at nighttime.  09/19/2013 Last OV with Hannah Beat, MD  Acute radiculopathy. No numbness or tingling . All started last Wednesday.  Just came back from a trip. Multiple problems went on during this trip, and they were up for 48 hours due to problems with train connections and train travel.  She primarily has pain in the shoulder blade and scapular region, and she'll set pain that travels down the posterior aspect of her arm all the way down through the entirety of the upper extremity and through the lower forearm to the hand. She does not really describe tingling or numbness, but she does have pain and goes throughout its entire region. Her neck itself does not feel that poorly, she has good range of motion.  Had to stay in the [place] train station. Did not sleep for 2 days.   She saw my partner 3 days ago and was given Vicodin, which hasn't helped too much. She is having difficulty sleeping, too.  Right now she is in a significant amount of pain, and the patient appears distraught.  Past Medical History, Surgical History, Social History, Family History, Problem List, Medications, and Allergies have been reviewed and  updated if relevant.  Review of Systems:  GEN: No fevers, chills. Nontoxic. Primarily MSK c/o today. MSK: Detailed in the HPI GI: tolerating PO intake without difficulty Neuro: as above Otherwise the pertinent positives of the ROS are noted above.   Objective:   Physical Examination: BP 118/66  Pulse 71  Temp(Src) 98.3 F (36.8 C) (Oral)  Ht 5\' 3"  (1.6 m)  Wt 153 lb 8 oz (69.627 kg)  BMI 27.20 kg/m2   GEN: Well-developed,well-nourished,in no acute distress; alert,appropriate and cooperative throughout examination HEENT: Normocephalic and atraumatic without obvious abnormalities. Ears, externally no deformities PULM: Breathing comfortably in no respiratory distress EXT: No clubbing, cyanosis, or edema PSYCH: Normally interactive. Cooperative during the interview. Pleasant. Friendly and conversant. Not anxious or depressed appearing. Normal, full affect.  CERVICAL SPINE EXAM Range of motion: Flexion, extension, lateral bending, and rotation: full range of motion Pain with terminal motion: None Spinous Processes: NT SCM: NT Upper paracervical muscles: mild ttp Upper traps: NT C5-T1 intact, sensation and motor   Radiology: No results found.  Assessment & Plan:   Cervical radiculopathy  She is doing remarkably well compared to the last time I saw her. Followup p.r.n. Discontinue Elavil  Signed,  Tristen Luce T. Girl Schissler, MD, CAQ Sports Medicine  Patient's Medications  New Prescriptions   No medications on file  Previous Medications   AMITRIPTYLINE (ELAVIL) 25 MG TABLET    Take 1 tablet (25 mg total) by mouth at bedtime.   COMBIGAN 0.2-0.5 % OPHTHALMIC SOLUTION  Place 1 drop into both eyes every 12 (twelve) hours.    GARLIC 100 MG TABS    Take 1 tablet by mouth daily.    HYDROCODONE-ACETAMINOPHEN (NORCO/VICODIN) 5-325 MG PER TABLET    Take 1 tablet by mouth every 6 (six) hours as needed for moderate pain.   MULTIPLE VITAMINS-MINERALS (CENTRUM SILVER ULTRA WOMENS) TABS     Take by mouth daily.   SIMVASTATIN (ZOCOR) 40 MG TABLET    Take 1 tablet (40 mg total) by mouth at bedtime.   VITAMIN B-12 (CYANOCOBALAMIN) 1000 MCG TABLET    Take 1,000 mcg by mouth daily.  Modified Medications   No medications on file  Discontinued Medications   PREDNISONE (DELTASONE) 20 MG TABLET    2 tabs po for 1 week, then 1 tab po

## 2013-11-16 ENCOUNTER — Encounter: Payer: Self-pay | Admitting: Internal Medicine

## 2013-11-16 MED ORDER — TRAZODONE HCL 50 MG PO TABS: 50.0000 mg | ORAL_TABLET | Freq: Every day | ORAL | Status: DC

## 2013-12-09 ENCOUNTER — Encounter: Payer: Self-pay | Admitting: Internal Medicine

## 2013-12-12 ENCOUNTER — Encounter: Payer: Self-pay | Admitting: Internal Medicine

## 2013-12-12 NOTE — Telephone Encounter (Signed)
I took care of this with [place].

## 2014-01-09 ENCOUNTER — Encounter: Payer: Self-pay | Admitting: Family Medicine

## 2014-01-17 ENCOUNTER — Encounter: Payer: Self-pay | Admitting: Internal Medicine

## 2014-01-31 ENCOUNTER — Ambulatory Visit (INDEPENDENT_AMBULATORY_CARE_PROVIDER_SITE_OTHER): Payer: Commercial Managed Care - HMO | Admitting: Internal Medicine

## 2014-01-31 ENCOUNTER — Encounter: Payer: Self-pay | Admitting: Internal Medicine

## 2014-01-31 VITALS — BP 106/64 | HR 61 | Temp 97.9°F | Wt 153.0 lb

## 2014-01-31 DIAGNOSIS — M2170 Unequal limb length (acquired), unspecified site: Secondary | ICD-10-CM

## 2014-01-31 DIAGNOSIS — S134XXA Sprain of ligaments of cervical spine, initial encounter: Secondary | ICD-10-CM

## 2014-01-31 DIAGNOSIS — K5900 Constipation, unspecified: Secondary | ICD-10-CM

## 2014-01-31 DIAGNOSIS — M810 Age-related osteoporosis without current pathological fracture: Secondary | ICD-10-CM

## 2014-01-31 DIAGNOSIS — Z23 Encounter for immunization: Secondary | ICD-10-CM

## 2014-01-31 DIAGNOSIS — K219 Gastro-esophageal reflux disease without esophagitis: Secondary | ICD-10-CM

## 2014-01-31 DIAGNOSIS — K5909 Other constipation: Secondary | ICD-10-CM

## 2014-01-31 DIAGNOSIS — S139XXA Sprain of joints and ligaments of unspecified parts of neck, initial encounter: Secondary | ICD-10-CM | POA: Insufficient documentation

## 2014-01-31 DIAGNOSIS — Z0000 Encounter for general adult medical examination without abnormal findings: Secondary | ICD-10-CM

## 2014-01-31 NOTE — Assessment & Plan Note (Signed)
I have personally reviewed the Medicare Annual Wellness questionnaire and have noted 1. The patient's medical and social history 2. Their use of alcohol, tobacco or illicit drugs 3. Their current medications and supplements 4. The patient's functional ability including ADL's, fall risks, home safety risks and hearing or visual             impairment. 5. Diet and physical activities 6. Evidence for depression or mood disorders  The patients weight, height, BMI and visual acuity have been recorded in the chart I have made referrals, counseling and provided education to the patient based review of the above and I have provided the pt with a written personalized care plan for preventive services.  I have provided you with a copy of your personalized plan for preventive services. Please take the time to review along with your updated medication list.  UTD on immunizations other than prevnar---will give No more colonoscopies or Pap due to age She wants to continue mammograms--- due 11/16 Discussed healthy behaviors

## 2014-01-31 NOTE — Assessment & Plan Note (Signed)
Discussed heat, etc ?related to chiropractic Rx

## 2014-01-31 NOTE — Assessment & Plan Note (Signed)
May be causing the foot cramps Recommended trying her orthotics again

## 2014-01-31 NOTE — Progress Notes (Signed)
Pre visit review using our clinic review tool, if applicable. No additional management support is needed unless otherwise documented below in the visit note. 

## 2014-01-31 NOTE — Assessment & Plan Note (Signed)
Has been quiet No recent meds

## 2014-01-31 NOTE — Patient Instructions (Addendum)
Please try loratadine for the allergies--- 10mg  daily or twice a day. Please try miralax (polyethylene glycol)-- 1 capful with water daily for the constipation.

## 2014-01-31 NOTE — Assessment & Plan Note (Signed)
Discussed trying miralax

## 2014-01-31 NOTE — Addendum Note (Signed)
Addended by: Emelia Salisbury C on: 01/31/2014 12:39 PM   Modules accepted: Orders

## 2014-01-31 NOTE — Assessment & Plan Note (Signed)
On vitamin D and tries to exercise

## 2014-01-31 NOTE — Progress Notes (Signed)
Subjective:    Patient ID: Sheryl Miller, female    DOB: [birth year], 78 y.o.   MRN: [id number]  HPI  Here for Medicare wellness and follow up Reviewed form and advanced directives Reviewed other doctors--- eye and dentist (Dr Lanell Persons) No falls No depression or anhedonia Regular with exercise No alcohol or tobacco Independent with instrumental ADLs Vision is declining---will be having cataract surgery soon Hearing is okay No apparent cognitive decline  Has a whole list of complaint Pain on left side of neck (along posterior SCM muscle) Goes back at least a month Notes at night mostly but can happen anytime  Lots of nasal drip meds don't help--using over the counter Has itching in left ear also  Still having sleep problems Taking 2 trazodone and the amitriptyline--will help occasionally Afraid of it--but if she takes it she does okay  Gets cramps in bottom of feet Will have to press real hard Happens every day--- often in bed  Still on statin No problems with that--no generalized muscle symptoms  Current Outpatient Prescriptions on File Prior to Visit  Medication Sig Dispense Refill  . amitriptyline (ELAVIL) 25 MG tablet Take 1 tablet (25 mg total) by mouth at bedtime. (Patient not taking: Reported on 01/31/2014) 30 tablet 2  . COMBIGAN 0.2-0.5 % ophthalmic solution Place 1 drop into both eyes every 12 (twelve) hours.     . Garlic 007 MG TABS Take 1 tablet by mouth daily.     Marland Kitchen HYDROcodone-acetaminophen (NORCO/VICODIN) 5-325 MG per tablet Take 1 tablet by mouth every 6 (six) hours as needed for moderate pain. (Patient not taking: Reported on 01/31/2014) 30 tablet 0  . Multiple Vitamins-Minerals (CENTRUM SILVER ULTRA WOMENS) TABS Take by mouth daily.    . simvastatin (ZOCOR) 40 MG tablet Take 1 tablet (40 mg total) by mouth at bedtime. 90 tablet 3  . traZODone (DESYREL) 50 MG tablet Take 1-2 tablets (50-100 mg total) by mouth at bedtime. (Patient taking differently:  Take 100 mg by mouth at bedtime. ) 60 tablet 11  . vitamin B-12 (CYANOCOBALAMIN) 1000 MCG tablet Take 1,000 mcg by mouth daily.     No current facility-administered medications on file prior to visit.    No Known Allergies  Past Medical History  Diagnosis Date  . Raynaud's syndrome   . Osteoporosis, unspecified   . Diverticulosis of colon (without mention of hemorrhage)   . Female stress incontinence   . Internal hemorrhoids without mention of complication   . Unspecified constipation   . Esophageal reflux   . Other and unspecified hyperlipidemia   . Allergic rhinitis, cause unspecified   . Rheumatic fever   . Breast cyst     Past Surgical History  Procedure Laterality Date  . Bladder repair    . Abdominal hysterectomy    . Tonsillectomy    . Breast biopsy  2000    fibrocystic disease  . Shoulder surgery       In 2006, bilaterally shoulder surgery for rotator cuff.  . Shoulder arthroscopy with rotator cuff repair and subacromial decompression  08/2001    R, Alphonzo Severance  . Tubal ligation      Family History  Problem Relation Age of Onset  . Coronary artery disease Mother   . Heart disease Mother   . Diabetes Mother   . COPD Father   . Breast cancer Sister     2 (age 85?, age 28?)  . Diabetes Brother   . Breast cancer Maternal Aunt  2  . Stroke Paternal Grandmother   . Diabetes Brother   . Breast cancer Daughter     ? at age 34    History   Social History  . Marital Status: Married    Spouse Name: N/A    Number of Children: 4  . Years of Education: N/A   Occupational History  . retired Press photographer    Social History Main Topics  . Smoking status: Never Smoker   . Smokeless tobacco: Never Used  . Alcohol Use: No  . Drug Use: No  . Sexual Activity: Not on file   Other Topics Concern  . Not on file   Social History Narrative   Regular exercise-yes, walking or rides stationery bike daily      Has living will.    Husband should make health care  decisions as needed, then son Herbie [place].    Would accept resuscitation but no prolonged ventilation.    Would not want feeding tube if not cognitively aware               Review of Systems No recent heartburn Appetite is fine Weight stable Bowels always a problem--constipation.     Objective:   Physical Exam  Constitutional: She is oriented to person, place, and time. She appears well-developed and well-nourished. No distress.  HENT:  Mouth/Throat: Oropharynx is clear and moist. No oropharyngeal exudate.  Neck: Normal range of motion. No thyromegaly present.  Pain spot with some tightness along left SCM  Cardiovascular: Normal rate, regular rhythm, normal heart sounds and intact distal pulses.  Exam reveals no gallop.   No murmur heard. Faint distal pulses  Pulmonary/Chest: Effort normal and breath sounds normal. No respiratory distress. She has no wheezes. She has no rales.  Abdominal: Soft. There is no tenderness.  Musculoskeletal:  Left foot more flat Slight leg length discrepancy  Lymphadenopathy:    She has no cervical adenopathy.  Neurological: She is alert and oriented to person, place, and time.  President-- "Obama, ?" doesn't pay attention to politics 100-... Doesn't do math D-[id number] Recall 3/3  Skin: No rash noted. No erythema.  Psychiatric: She has a normal mood and affect. Her behavior is normal.          Assessment & Plan:

## 2014-02-01 ENCOUNTER — Encounter: Payer: Self-pay | Admitting: Internal Medicine

## 2014-02-06 ENCOUNTER — Encounter: Payer: Self-pay | Admitting: Internal Medicine

## 2014-02-08 ENCOUNTER — Ambulatory Visit: Payer: Self-pay | Admitting: Ophthalmology

## 2014-02-17 ENCOUNTER — Encounter: Payer: Self-pay | Admitting: Internal Medicine

## 2014-03-22 ENCOUNTER — Ambulatory Visit: Payer: Self-pay | Admitting: Ophthalmology

## 2014-03-22 DIAGNOSIS — H2511 Age-related nuclear cataract, right eye: Secondary | ICD-10-CM | POA: Diagnosis not present

## 2014-03-22 DIAGNOSIS — Z9842 Cataract extraction status, left eye: Secondary | ICD-10-CM | POA: Diagnosis not present

## 2014-03-22 DIAGNOSIS — Z79899 Other long term (current) drug therapy: Secondary | ICD-10-CM | POA: Diagnosis not present

## 2014-03-22 DIAGNOSIS — J309 Allergic rhinitis, unspecified: Secondary | ICD-10-CM | POA: Diagnosis not present

## 2014-03-22 DIAGNOSIS — E785 Hyperlipidemia, unspecified: Secondary | ICD-10-CM | POA: Diagnosis not present

## 2014-03-22 DIAGNOSIS — I7300 Raynaud's syndrome without gangrene: Secondary | ICD-10-CM | POA: Diagnosis not present

## 2014-03-22 DIAGNOSIS — Z888 Allergy status to other drugs, medicaments and biological substances status: Secondary | ICD-10-CM | POA: Diagnosis not present

## 2014-03-22 DIAGNOSIS — Z9889 Other specified postprocedural states: Secondary | ICD-10-CM | POA: Diagnosis not present

## 2014-03-22 DIAGNOSIS — H269 Unspecified cataract: Secondary | ICD-10-CM | POA: Diagnosis not present

## 2014-03-27 ENCOUNTER — Encounter: Payer: Self-pay | Admitting: Internal Medicine

## 2014-03-31 ENCOUNTER — Other Ambulatory Visit: Payer: Self-pay | Admitting: Internal Medicine

## 2014-04-05 ENCOUNTER — Encounter: Payer: Self-pay | Admitting: Internal Medicine

## 2014-04-06 DIAGNOSIS — M5136 Other intervertebral disc degeneration, lumbar region: Secondary | ICD-10-CM | POA: Diagnosis not present

## 2014-04-06 DIAGNOSIS — M9903 Segmental and somatic dysfunction of lumbar region: Secondary | ICD-10-CM | POA: Diagnosis not present

## 2014-04-06 DIAGNOSIS — M9905 Segmental and somatic dysfunction of pelvic region: Secondary | ICD-10-CM | POA: Diagnosis not present

## 2014-04-06 DIAGNOSIS — M955 Acquired deformity of pelvis: Secondary | ICD-10-CM | POA: Diagnosis not present

## 2014-04-14 ENCOUNTER — Encounter: Payer: Self-pay | Admitting: Internal Medicine

## 2014-04-20 ENCOUNTER — Encounter: Payer: Self-pay | Admitting: Internal Medicine

## 2014-04-20 ENCOUNTER — Ambulatory Visit (INDEPENDENT_AMBULATORY_CARE_PROVIDER_SITE_OTHER): Payer: Commercial Managed Care - HMO | Admitting: Internal Medicine

## 2014-04-20 VITALS — BP 134/70 | HR 59 | Temp 97.9°F | Wt 158.2 lb

## 2014-04-20 DIAGNOSIS — G479 Sleep disorder, unspecified: Secondary | ICD-10-CM | POA: Insufficient documentation

## 2014-04-20 MED ORDER — TRAZODONE HCL 150 MG PO TABS: 150.0000 mg | ORAL_TABLET | Freq: Every evening | ORAL | Status: DC | PRN

## 2014-04-20 NOTE — Assessment & Plan Note (Signed)
Has had long standing problems Does initiate fine---was not clear about the trazodone. She was disturbed by taking 3 tabs, but maybe it has helped She initiates by reading in bed for a half hour or so---discussed that she should try this when she awakens at night (since her body is conditioned to this) After discussion, she would like to continue the trazodone Could consider temazepam--but discussed possible dependence and she would like to avoid this type of med (and so would I) Counseled all of 15 minute visit

## 2014-04-20 NOTE — Progress Notes (Signed)
Pre visit review using our clinic review tool, if applicable. No additional management support is needed unless otherwise documented below in the visit note. 

## 2014-04-20 NOTE — Patient Instructions (Signed)
Please try the 1 trazodone 150mg  at bedtime. You can skip this if you wish. Try reading with your kindle when you awaken and let yourself fall back asleep just like you fall asleep initially

## 2014-04-20 NOTE — Progress Notes (Signed)
Subjective:    Patient ID: Sheryl Miller, female    DOB: 29-Jan-1936, 79 y.o.   MRN: [id number]  HPI Here due to trouble sleeping  Has increased the trazodone to 150mg  but still awakens at 2 or 4AM and can't get back to sleep Initiates sleep ~11PM mostly and sometimes later. No problems initiating  Reads in bed before falling asleep  Rare alcohol No caffeine-- all tea and coffee are decaf No eating or drinking near bedtime Always has something on her mind--will just lie there when she awakens Rarely gets back to sleep---doesn't seem to be tired in the morning  Tried melatonin at first--didn't really help  Current Outpatient Prescriptions on File Prior to Visit  Medication Sig Dispense Refill  . COMBIGAN 0.2-0.5 % ophthalmic solution Place 1 drop into both eyes every 12 (twelve) hours.     . Multiple Vitamins-Minerals (CENTRUM SILVER ULTRA WOMENS) TABS Take by mouth daily.    . simvastatin (ZOCOR) 40 MG tablet TAKE 1 TABLET AT BEDTIME 90 tablet 3  . traZODone (DESYREL) 50 MG tablet Take 1-2 tablets (50-100 mg total) by mouth at bedtime. (Patient taking differently: Take 100 mg by mouth at bedtime. ) 60 tablet 11   No current facility-administered medications on file prior to visit.    No Known Allergies  Past Medical History  Diagnosis Date  . Raynaud's syndrome   . Osteoporosis, unspecified   . Diverticulosis of colon (without mention of hemorrhage)   . Female stress incontinence   . Internal hemorrhoids without mention of complication   . Unspecified constipation   . Esophageal reflux   . Other and unspecified hyperlipidemia   . Allergic rhinitis, cause unspecified   . Rheumatic fever   . Breast cyst     Past Surgical History  Procedure Laterality Date  . Bladder repair    . Abdominal hysterectomy    . Tonsillectomy    . Breast biopsy  2000    fibrocystic disease  . Shoulder surgery       In 2006, bilaterally shoulder surgery for rotator cuff.  . Shoulder  arthroscopy with rotator cuff repair and subacromial decompression  08/2001    R, Alphonzo Severance  . Tubal ligation      Family History  Problem Relation Age of Onset  . Coronary artery disease Mother   . Heart disease Mother   . Diabetes Mother   . COPD Father   . Breast cancer Sister     2 (age 48?, age 48?)  . Diabetes Brother   . Breast cancer Maternal Aunt     2  . Stroke Paternal Grandmother   . Diabetes Brother   . Breast cancer Daughter     ? at age 35    History   Social History  . Marital Status: Married    Spouse Name: N/A  . Number of Children: 4  . Years of Education: N/A   Occupational History  . retired Press photographer    Social History Main Topics  . Smoking status: Never Smoker   . Smokeless tobacco: Never Used  . Alcohol Use: No  . Drug Use: No  . Sexual Activity: Not on file   Other Topics Concern  . Not on file   Social History Narrative   Regular exercise-yes, walking or rides stationery bike daily      Has living will.    Husband should make health care decisions as needed, then son Herbie [place].    Would accept resuscitation  but no prolonged ventilation.    Would not want feeding tube if not cognitively aware               Review of Systems Appetite is okay Husband doesn't bother her at night    Objective:   Physical Exam  Constitutional: She appears well-developed and well-nourished. No distress.  Psychiatric: She has a normal mood and affect. Her behavior is normal.          Assessment & Plan:

## 2014-05-04 DIAGNOSIS — M955 Acquired deformity of pelvis: Secondary | ICD-10-CM | POA: Diagnosis not present

## 2014-05-04 DIAGNOSIS — M9903 Segmental and somatic dysfunction of lumbar region: Secondary | ICD-10-CM | POA: Diagnosis not present

## 2014-05-04 DIAGNOSIS — M5136 Other intervertebral disc degeneration, lumbar region: Secondary | ICD-10-CM | POA: Diagnosis not present

## 2014-05-04 DIAGNOSIS — M9905 Segmental and somatic dysfunction of pelvic region: Secondary | ICD-10-CM | POA: Diagnosis not present

## 2014-05-18 DIAGNOSIS — M5136 Other intervertebral disc degeneration, lumbar region: Secondary | ICD-10-CM | POA: Diagnosis not present

## 2014-05-18 DIAGNOSIS — M955 Acquired deformity of pelvis: Secondary | ICD-10-CM | POA: Diagnosis not present

## 2014-05-18 DIAGNOSIS — M9903 Segmental and somatic dysfunction of lumbar region: Secondary | ICD-10-CM | POA: Diagnosis not present

## 2014-05-18 DIAGNOSIS — M9905 Segmental and somatic dysfunction of pelvic region: Secondary | ICD-10-CM | POA: Diagnosis not present

## 2014-05-22 DIAGNOSIS — M62830 Muscle spasm of back: Secondary | ICD-10-CM | POA: Diagnosis not present

## 2014-05-22 DIAGNOSIS — M9901 Segmental and somatic dysfunction of cervical region: Secondary | ICD-10-CM | POA: Diagnosis not present

## 2014-05-22 DIAGNOSIS — M5136 Other intervertebral disc degeneration, lumbar region: Secondary | ICD-10-CM | POA: Diagnosis not present

## 2014-05-22 DIAGNOSIS — M9905 Segmental and somatic dysfunction of pelvic region: Secondary | ICD-10-CM | POA: Diagnosis not present

## 2014-05-22 DIAGNOSIS — M955 Acquired deformity of pelvis: Secondary | ICD-10-CM | POA: Diagnosis not present

## 2014-05-22 DIAGNOSIS — M9903 Segmental and somatic dysfunction of lumbar region: Secondary | ICD-10-CM | POA: Diagnosis not present

## 2014-06-22 ENCOUNTER — Ambulatory Visit (INDEPENDENT_AMBULATORY_CARE_PROVIDER_SITE_OTHER)
Admission: RE | Admit: 2014-06-22 | Discharge: 2014-06-22 | Disposition: A | Discharge status: Home / Self Care | Payer: Commercial Managed Care - HMO | Source: Ambulatory Visit | Attending: Family Medicine | Admitting: Family Medicine

## 2014-06-22 ENCOUNTER — Emergency Department
Admit: 2014-06-22 | Disposition: A | Discharge status: Home / Self Care | Payer: Self-pay | Admitting: Emergency Medicine

## 2014-06-22 ENCOUNTER — Ambulatory Visit (INDEPENDENT_AMBULATORY_CARE_PROVIDER_SITE_OTHER): Payer: Commercial Managed Care - HMO | Admitting: Family Medicine

## 2014-06-22 ENCOUNTER — Encounter: Payer: Self-pay | Admitting: Family Medicine

## 2014-06-22 VITALS — BP 154/72 | HR 66 | Temp 98.3°F | Ht 63.0 in

## 2014-06-22 DIAGNOSIS — M25561 Pain in right knee: Secondary | ICD-10-CM

## 2014-06-22 DIAGNOSIS — M25461 Effusion, right knee: Secondary | ICD-10-CM

## 2014-06-22 DIAGNOSIS — M25571 Pain in right ankle and joints of right foot: Secondary | ICD-10-CM

## 2014-06-22 DIAGNOSIS — M19071 Primary osteoarthritis, right ankle and foot: Secondary | ICD-10-CM | POA: Diagnosis not present

## 2014-06-22 DIAGNOSIS — M1711 Unilateral primary osteoarthritis, right knee: Secondary | ICD-10-CM | POA: Diagnosis not present

## 2014-06-22 DIAGNOSIS — Z79899 Other long term (current) drug therapy: Secondary | ICD-10-CM | POA: Diagnosis not present

## 2014-06-22 NOTE — Progress Notes (Signed)
Pre visit review using our clinic review tool, if applicable. No additional management support is needed unless otherwise documented below in the visit note. 

## 2014-06-22 NOTE — Patient Instructions (Signed)
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
## 2014-06-23 ENCOUNTER — Telehealth: Payer: Self-pay | Admitting: Internal Medicine

## 2014-06-23 DIAGNOSIS — M25461 Effusion, right knee: Secondary | ICD-10-CM

## 2014-06-23 NOTE — Telephone Encounter (Signed)
DME order, office note and demographics faxed to [facility] [phone].  Koralynn notified order is going to be faxed over to [facility].

## 2014-06-23 NOTE — Telephone Encounter (Signed)
PLease help:  I don't know how to make this happen ASAP.  This will need to go to a medical supply store to obtain, we can call in an order anywhere they would like and they will send me paperwork.  We send more people to [facility] supply.

## 2014-06-23 NOTE — Telephone Encounter (Signed)
Pt is requesting a walker with a chair on it that you roll around the house on.  Please call pt regarding this.  Phone number is [phone]. Thanks.

## 2014-06-24 ENCOUNTER — Encounter: Payer: Self-pay | Admitting: Internal Medicine

## 2014-06-26 ENCOUNTER — Ambulatory Visit
Admission: RE | Admit: 2014-06-26 | Discharge: 2014-06-26 | Disposition: A | Discharge status: Home / Self Care | Payer: Commercial Managed Care - HMO | Source: Ambulatory Visit | Attending: Family Medicine | Admitting: Family Medicine

## 2014-06-26 ENCOUNTER — Other Ambulatory Visit: Payer: Self-pay | Admitting: Family Medicine

## 2014-06-26 DIAGNOSIS — M23321 Other meniscus derangements, posterior horn of medial meniscus, right knee: Secondary | ICD-10-CM | POA: Diagnosis not present

## 2014-06-26 DIAGNOSIS — M25561 Pain in right knee: Secondary | ICD-10-CM

## 2014-06-26 DIAGNOSIS — M25461 Effusion, right knee: Secondary | ICD-10-CM | POA: Diagnosis not present

## 2014-06-26 DIAGNOSIS — S83241A Other tear of medial meniscus, current injury, right knee, initial encounter: Secondary | ICD-10-CM

## 2014-06-26 NOTE — Telephone Encounter (Signed)
Verbal order called to [facility] as well as faxed order, demographics, office note & insurance card to [place] [phone].

## 2014-06-26 NOTE — Telephone Encounter (Signed)
Melissa with [facility] left v/m; [facility] is not in network with [employer]; Melissa advised to send order to [facility] which is in network with [employer].

## 2014-06-30 ENCOUNTER — Encounter: Payer: Self-pay | Admitting: Family Medicine

## 2014-07-10 DIAGNOSIS — M94261 Chondromalacia, right knee: Secondary | ICD-10-CM | POA: Diagnosis not present

## 2014-07-10 DIAGNOSIS — S83241D Other tear of medial meniscus, current injury, right knee, subsequent encounter: Secondary | ICD-10-CM | POA: Diagnosis not present

## 2014-07-10 DIAGNOSIS — Y929 Unspecified place or not applicable: Secondary | ICD-10-CM | POA: Diagnosis not present

## 2014-07-10 DIAGNOSIS — M94241 Chondromalacia, joints of right hand: Secondary | ICD-10-CM | POA: Diagnosis not present

## 2014-07-10 DIAGNOSIS — S83241A Other tear of medial meniscus, current injury, right knee, initial encounter: Secondary | ICD-10-CM | POA: Diagnosis not present

## 2014-07-10 DIAGNOSIS — G8918 Other acute postprocedural pain: Secondary | ICD-10-CM | POA: Diagnosis not present

## 2014-07-10 DIAGNOSIS — X58XXXA Exposure to other specified factors, initial encounter: Secondary | ICD-10-CM | POA: Diagnosis not present

## 2014-07-12 NOTE — Telephone Encounter (Addendum)
[facility] left v/m; received rx for walker with wheels and seat; needs pt weight faxed to [phone];  I saw pt could not bear weight at last visit; can call [facility] at [phone].

## 2014-07-12 NOTE — Telephone Encounter (Signed)
Last documented weight:  158 lbs 4 oz HT: 5'3  Called to [place] at [place] .

## 2014-08-15 ENCOUNTER — Encounter: Payer: Self-pay | Admitting: Internal Medicine

## 2014-08-15 DIAGNOSIS — L814 Other melanin hyperpigmentation: Secondary | ICD-10-CM | POA: Diagnosis not present

## 2014-08-15 DIAGNOSIS — D1800 Hemangioma unspecified site: Secondary | ICD-10-CM | POA: Diagnosis not present

## 2014-08-15 DIAGNOSIS — Z1283 Encounter for screening for malignant neoplasm of skin: Secondary | ICD-10-CM | POA: Diagnosis not present

## 2014-08-15 DIAGNOSIS — L219 Seborrheic dermatitis, unspecified: Secondary | ICD-10-CM | POA: Diagnosis not present

## 2014-08-15 DIAGNOSIS — D229 Melanocytic nevi, unspecified: Secondary | ICD-10-CM | POA: Diagnosis not present

## 2014-08-15 DIAGNOSIS — L821 Other seborrheic keratosis: Secondary | ICD-10-CM | POA: Diagnosis not present

## 2014-08-15 DIAGNOSIS — L820 Inflamed seborrheic keratosis: Secondary | ICD-10-CM | POA: Diagnosis not present

## 2014-08-15 DIAGNOSIS — I8393 Asymptomatic varicose veins of bilateral lower extremities: Secondary | ICD-10-CM | POA: Diagnosis not present

## 2014-08-31 DIAGNOSIS — Z961 Presence of intraocular lens: Secondary | ICD-10-CM | POA: Diagnosis not present

## 2014-09-05 DIAGNOSIS — M25561 Pain in right knee: Secondary | ICD-10-CM | POA: Diagnosis not present

## 2014-09-18 DIAGNOSIS — H26491 Other secondary cataract, right eye: Secondary | ICD-10-CM | POA: Diagnosis not present

## 2014-09-22 DIAGNOSIS — M25561 Pain in right knee: Secondary | ICD-10-CM | POA: Diagnosis not present

## 2014-09-22 DIAGNOSIS — M94261 Chondromalacia, right knee: Secondary | ICD-10-CM | POA: Diagnosis not present

## 2014-09-28 DIAGNOSIS — M9905 Segmental and somatic dysfunction of pelvic region: Secondary | ICD-10-CM | POA: Diagnosis not present

## 2014-09-28 DIAGNOSIS — H26492 Other secondary cataract, left eye: Secondary | ICD-10-CM | POA: Diagnosis not present

## 2014-09-28 DIAGNOSIS — M9903 Segmental and somatic dysfunction of lumbar region: Secondary | ICD-10-CM | POA: Diagnosis not present

## 2014-09-28 DIAGNOSIS — M5136 Other intervertebral disc degeneration, lumbar region: Secondary | ICD-10-CM | POA: Diagnosis not present

## 2014-09-28 DIAGNOSIS — M955 Acquired deformity of pelvis: Secondary | ICD-10-CM | POA: Diagnosis not present

## 2014-10-02 DIAGNOSIS — M955 Acquired deformity of pelvis: Secondary | ICD-10-CM | POA: Diagnosis not present

## 2014-10-02 DIAGNOSIS — M5136 Other intervertebral disc degeneration, lumbar region: Secondary | ICD-10-CM | POA: Diagnosis not present

## 2014-10-02 DIAGNOSIS — M9903 Segmental and somatic dysfunction of lumbar region: Secondary | ICD-10-CM | POA: Diagnosis not present

## 2014-10-02 DIAGNOSIS — M9905 Segmental and somatic dysfunction of pelvic region: Secondary | ICD-10-CM | POA: Diagnosis not present

## 2014-10-05 DIAGNOSIS — M9903 Segmental and somatic dysfunction of lumbar region: Secondary | ICD-10-CM | POA: Diagnosis not present

## 2014-10-05 DIAGNOSIS — M955 Acquired deformity of pelvis: Secondary | ICD-10-CM | POA: Diagnosis not present

## 2014-10-05 DIAGNOSIS — M5136 Other intervertebral disc degeneration, lumbar region: Secondary | ICD-10-CM | POA: Diagnosis not present

## 2014-10-05 DIAGNOSIS — M9905 Segmental and somatic dysfunction of pelvic region: Secondary | ICD-10-CM | POA: Diagnosis not present

## 2014-10-09 DIAGNOSIS — M5136 Other intervertebral disc degeneration, lumbar region: Secondary | ICD-10-CM | POA: Diagnosis not present

## 2014-10-09 DIAGNOSIS — M9903 Segmental and somatic dysfunction of lumbar region: Secondary | ICD-10-CM | POA: Diagnosis not present

## 2014-10-09 DIAGNOSIS — M9905 Segmental and somatic dysfunction of pelvic region: Secondary | ICD-10-CM | POA: Diagnosis not present

## 2014-10-09 DIAGNOSIS — M955 Acquired deformity of pelvis: Secondary | ICD-10-CM | POA: Diagnosis not present

## 2014-10-11 DIAGNOSIS — M9903 Segmental and somatic dysfunction of lumbar region: Secondary | ICD-10-CM | POA: Diagnosis not present

## 2014-10-11 DIAGNOSIS — M5136 Other intervertebral disc degeneration, lumbar region: Secondary | ICD-10-CM | POA: Diagnosis not present

## 2014-10-11 DIAGNOSIS — M9905 Segmental and somatic dysfunction of pelvic region: Secondary | ICD-10-CM | POA: Diagnosis not present

## 2014-10-11 DIAGNOSIS — M955 Acquired deformity of pelvis: Secondary | ICD-10-CM | POA: Diagnosis not present

## 2014-10-13 DIAGNOSIS — M955 Acquired deformity of pelvis: Secondary | ICD-10-CM | POA: Diagnosis not present

## 2014-10-13 DIAGNOSIS — M9905 Segmental and somatic dysfunction of pelvic region: Secondary | ICD-10-CM | POA: Diagnosis not present

## 2014-10-13 DIAGNOSIS — M5136 Other intervertebral disc degeneration, lumbar region: Secondary | ICD-10-CM | POA: Diagnosis not present

## 2014-10-13 DIAGNOSIS — M9903 Segmental and somatic dysfunction of lumbar region: Secondary | ICD-10-CM | POA: Diagnosis not present

## 2014-10-16 ENCOUNTER — Encounter: Payer: Self-pay | Admitting: Internal Medicine

## 2014-10-18 ENCOUNTER — Ambulatory Visit: Payer: Self-pay | Admitting: Internal Medicine

## 2014-10-19 ENCOUNTER — Ambulatory Visit: Payer: Self-pay | Admitting: Internal Medicine

## 2014-10-19 ENCOUNTER — Ambulatory Visit (INDEPENDENT_AMBULATORY_CARE_PROVIDER_SITE_OTHER): Payer: Commercial Managed Care - HMO | Admitting: Internal Medicine

## 2014-10-19 ENCOUNTER — Encounter: Payer: Self-pay | Admitting: Internal Medicine

## 2014-10-19 VITALS — BP 110/70 | HR 61 | Temp 97.5°F | Wt 159.0 lb

## 2014-10-19 DIAGNOSIS — G479 Sleep disorder, unspecified: Secondary | ICD-10-CM

## 2014-10-19 DIAGNOSIS — M9903 Segmental and somatic dysfunction of lumbar region: Secondary | ICD-10-CM | POA: Diagnosis not present

## 2014-10-19 DIAGNOSIS — M5136 Other intervertebral disc degeneration, lumbar region: Secondary | ICD-10-CM | POA: Diagnosis not present

## 2014-10-19 DIAGNOSIS — M9905 Segmental and somatic dysfunction of pelvic region: Secondary | ICD-10-CM | POA: Diagnosis not present

## 2014-10-19 DIAGNOSIS — M955 Acquired deformity of pelvis: Secondary | ICD-10-CM | POA: Diagnosis not present

## 2014-10-19 MED ORDER — MIRTAZAPINE 15 MG PO TABS: 7.5000 mg | ORAL_TABLET | Freq: Every day | ORAL | Status: DC

## 2014-10-19 NOTE — Assessment & Plan Note (Addendum)
Failed melatonin and trazodone hasn't helped intiates around 11:30 (after reading in bed) and then awakens by 6:30AM  Can't nap  She is considering accupunture--offerred by her chiropractor   I advised her to stop the trazodone After several nights with no med, will try mirtazapine-- 7.5mg  at bedtime  Offered consideration of 2-3 night per week hypnotic/sedative----she adamantly doesn't want this May consider gabapentin-- as alternative without dependence risk

## 2014-10-19 NOTE — Patient Instructions (Signed)
Please stop the trazodone and don't take any med for 3-7 days.  After that, you can try the mirtazapine. Try just 1/2 tab for 2-3 weeks, and then increase to a full tab if needed

## 2014-10-19 NOTE — Progress Notes (Signed)
Subjective:    Patient ID: Sheryl Miller, female    DOB: [birth year], 79 y.o.   MRN: [id number]  HPI Here due to ongoing sleep problems  Trazodone is not helping Still taking it though  Tries to read and settle her mind Initiates fine--but then wakes a few hours later and then not able to get back to sleep. She goes to bathroom and goes back to bed Just tosses and turns after that  Doesn't snore Husband doesn't awaken so doesn't know  Current Outpatient Prescriptions on File Prior to Visit  Medication Sig Dispense Refill  . COMBIGAN 0.2-0.5 % ophthalmic solution Place 1 drop into both eyes every 12 (twelve) hours.     . Multiple Vitamins-Minerals (CENTRUM SILVER ULTRA WOMENS) TABS Take by mouth daily.    . simvastatin (ZOCOR) 40 MG tablet TAKE 1 TABLET AT BEDTIME 90 tablet 3  . traZODone (DESYREL) 150 MG tablet Take 1 tablet (150 mg total) by mouth at bedtime as needed for sleep. 90 tablet 3   No current facility-administered medications on file prior to visit.    No Known Allergies  Past Medical History  Diagnosis Date  . Raynaud's syndrome   . Osteoporosis, unspecified   . Diverticulosis of colon (without mention of hemorrhage)   . Female stress incontinence   . Internal hemorrhoids without mention of complication   . Unspecified constipation   . Esophageal reflux   . Other and unspecified hyperlipidemia   . Allergic rhinitis, cause unspecified   . Rheumatic fever   . Breast cyst     Past Surgical History  Procedure Laterality Date  . Bladder repair    . Abdominal hysterectomy    . Tonsillectomy    . Breast biopsy  2000    fibrocystic disease  . Shoulder surgery       In 2006, bilaterally shoulder surgery for rotator cuff.  . Shoulder arthroscopy with rotator cuff repair and subacromial decompression  08/2001    R, Alphonzo Severance  . Tubal ligation      Family History  Problem Relation Age of Onset  . Coronary artery disease Mother   . Heart disease  Mother   . Diabetes Mother   . COPD Father   . Breast cancer Sister     2 (age 39?, age 6?)  . Diabetes Brother   . Breast cancer Maternal Aunt     2  . Stroke Paternal Grandmother   . Diabetes Brother   . Breast cancer Daughter     ? at age 12    Social History   Social History  . Marital Status: Married    Spouse Name: N/A  . Number of Children: 4  . Years of Education: N/A   Occupational History  . retired Press photographer    Social History Main Topics  . Smoking status: Never Smoker   . Smokeless tobacco: Never Used  . Alcohol Use: No  . Drug Use: No  . Sexual Activity: Not on file   Other Topics Concern  . Not on file   Social History Narrative   Regular exercise-yes, walking or rides stationery bike daily      Has living will.    Husband should make health care decisions as needed, then son Herbie [place].    Would accept resuscitation but no prolonged ventilation.    Would not want feeding tube if not cognitively aware               Review  of Systems Appetite is good Weight is stable    Objective:   Physical Exam  Psychiatric: She has a normal mood and affect. Her behavior is normal.          Assessment & Plan:

## 2014-10-19 NOTE — Progress Notes (Signed)
Pre visit review using our clinic review tool, if applicable. No additional management support is needed unless otherwise documented below in the visit note. 

## 2014-10-20 DIAGNOSIS — M94261 Chondromalacia, right knee: Secondary | ICD-10-CM | POA: Diagnosis not present

## 2014-10-20 DIAGNOSIS — S83251D Bucket-handle tear of lateral meniscus, current injury, right knee, subsequent encounter: Secondary | ICD-10-CM | POA: Diagnosis not present

## 2014-10-20 DIAGNOSIS — M25561 Pain in right knee: Secondary | ICD-10-CM | POA: Diagnosis not present

## 2014-10-31 ENCOUNTER — Encounter: Payer: Self-pay | Admitting: Internal Medicine

## 2014-11-02 DIAGNOSIS — M955 Acquired deformity of pelvis: Secondary | ICD-10-CM | POA: Diagnosis not present

## 2014-11-02 DIAGNOSIS — M5136 Other intervertebral disc degeneration, lumbar region: Secondary | ICD-10-CM | POA: Diagnosis not present

## 2014-11-02 DIAGNOSIS — M9905 Segmental and somatic dysfunction of pelvic region: Secondary | ICD-10-CM | POA: Diagnosis not present

## 2014-11-02 DIAGNOSIS — M9903 Segmental and somatic dysfunction of lumbar region: Secondary | ICD-10-CM | POA: Diagnosis not present

## 2014-11-23 DIAGNOSIS — M5136 Other intervertebral disc degeneration, lumbar region: Secondary | ICD-10-CM | POA: Diagnosis not present

## 2014-11-23 DIAGNOSIS — M9903 Segmental and somatic dysfunction of lumbar region: Secondary | ICD-10-CM | POA: Diagnosis not present

## 2014-11-23 DIAGNOSIS — M955 Acquired deformity of pelvis: Secondary | ICD-10-CM | POA: Diagnosis not present

## 2014-11-23 DIAGNOSIS — M9905 Segmental and somatic dysfunction of pelvic region: Secondary | ICD-10-CM | POA: Diagnosis not present

## 2014-12-01 DIAGNOSIS — M25561 Pain in right knee: Secondary | ICD-10-CM | POA: Diagnosis not present

## 2014-12-21 DIAGNOSIS — M955 Acquired deformity of pelvis: Secondary | ICD-10-CM | POA: Diagnosis not present

## 2014-12-21 DIAGNOSIS — M5136 Other intervertebral disc degeneration, lumbar region: Secondary | ICD-10-CM | POA: Diagnosis not present

## 2014-12-21 DIAGNOSIS — M9903 Segmental and somatic dysfunction of lumbar region: Secondary | ICD-10-CM | POA: Diagnosis not present

## 2014-12-21 DIAGNOSIS — M9905 Segmental and somatic dysfunction of pelvic region: Secondary | ICD-10-CM | POA: Diagnosis not present

## 2014-12-22 ENCOUNTER — Ambulatory Visit (INDEPENDENT_AMBULATORY_CARE_PROVIDER_SITE_OTHER): Payer: Commercial Managed Care - HMO

## 2014-12-22 DIAGNOSIS — Z23 Encounter for immunization: Secondary | ICD-10-CM

## 2014-12-26 ENCOUNTER — Encounter (HOSPITAL_COMMUNITY): Admission: RE | Payer: Self-pay | Source: Ambulatory Visit

## 2014-12-26 ENCOUNTER — Inpatient Hospital Stay (HOSPITAL_COMMUNITY)
Admission: RE | Admit: 2014-12-26 | Payer: Commercial Managed Care - HMO | Source: Ambulatory Visit | Admitting: Orthopedic Surgery

## 2014-12-26 SURGERY — ARTHROPLASTY, KNEE, UNICOMPARTMENTAL
Anesthesia: General | Site: Knee | Laterality: Right

## 2015-01-04 ENCOUNTER — Encounter: Payer: Self-pay | Admitting: Internal Medicine

## 2015-01-15 ENCOUNTER — Encounter: Payer: Self-pay | Admitting: Internal Medicine

## 2015-01-15 ENCOUNTER — Ambulatory Visit (INDEPENDENT_AMBULATORY_CARE_PROVIDER_SITE_OTHER): Payer: Commercial Managed Care - HMO | Admitting: Internal Medicine

## 2015-01-15 VITALS — BP 132/70 | HR 61 | Temp 98.4°F | Wt 160.0 lb

## 2015-01-15 DIAGNOSIS — S2001XA Contusion of right breast, initial encounter: Secondary | ICD-10-CM

## 2015-01-15 NOTE — Assessment & Plan Note (Signed)
Reassured Should resolve without any sequelae and no action needed

## 2015-01-15 NOTE — Progress Notes (Signed)
Pre visit review using our clinic review tool, if applicable. No additional management support is needed unless otherwise documented below in the visit note. 

## 2015-01-15 NOTE — Progress Notes (Signed)
Subjective:    Patient ID: Sheryl Miller, female    DOB: 28-Jan-1936, 78 y.o.   MRN: [id number]  HPI Here due to fall last week Went to close shade, and tripped over bag of weights Came onto right knee Later, noticed black and blue on right breast (might have come down on arm pushed up against breast)  Mild pain only---just tender  No treatment for this  Current Outpatient Prescriptions on File Prior to Visit  Medication Sig Dispense Refill  . COMBIGAN 0.2-0.5 % ophthalmic solution Place 1 drop into both eyes every 12 (twelve) hours.     . Multiple Vitamins-Minerals (CENTRUM SILVER ULTRA WOMENS) TABS Take by mouth daily.    . simvastatin (ZOCOR) 40 MG tablet TAKE 1 TABLET AT BEDTIME 90 tablet 3   No current facility-administered medications on file prior to visit.    No Known Allergies  Past Medical History  Diagnosis Date  . Raynaud's syndrome   . Osteoporosis, unspecified   . Diverticulosis of colon (without mention of hemorrhage)   . Female stress incontinence   . Internal hemorrhoids without mention of complication   . Unspecified constipation   . Esophageal reflux   . Other and unspecified hyperlipidemia   . Allergic rhinitis, cause unspecified   . Rheumatic fever   . Breast cyst     Past Surgical History  Procedure Laterality Date  . Bladder repair    . Abdominal hysterectomy    . Tonsillectomy    . Breast biopsy  2000    fibrocystic disease  . Shoulder surgery       In 2006, bilaterally shoulder surgery for rotator cuff.  . Shoulder arthroscopy with rotator cuff repair and subacromial decompression  08/2001    R, Alphonzo Severance  . Tubal ligation      Family History  Problem Relation Age of Onset  . Coronary artery disease Mother   . Heart disease Mother   . Diabetes Mother   . COPD Father   . Breast cancer Sister     2 (age 85?, age 60?)  . Diabetes Brother   . Breast cancer Maternal Aunt     2  . Stroke Paternal Grandmother   . Diabetes  Brother   . Breast cancer Daughter     ? at age 35    Social History   Social History  . Marital Status: Married    Spouse Name: N/A  . Number of Children: 4  . Years of Education: N/A   Occupational History  . retired Press photographer    Social History Main Topics  . Smoking status: Never Smoker   . Smokeless tobacco: Never Used  . Alcohol Use: No  . Drug Use: No  . Sexual Activity: Not on file   Other Topics Concern  . Not on file   Social History Narrative   Regular exercise-yes, walking or rides stationery bike daily      Has living will.    Husband should make health care decisions as needed, then son Herbie [place].    Would accept resuscitation but no prolonged ventilation.    Would not want feeding tube if not cognitively aware               Review of Systems  Knee is back to usual--has arthritis She feels fine otherwise--- normal eating, sleeping, activities Having some increased gas lately--gave info on low FODMAP diet     Objective:   Physical Exam  Abdominal: Soft. There  is no tenderness.  Genitourinary:  Right breast with moderate cystic changes Purplish ecchymosis medial lower right breast ~5cm diameter No sig hematoma Mildly tender          Assessment & Plan:

## 2015-01-18 DIAGNOSIS — M9903 Segmental and somatic dysfunction of lumbar region: Secondary | ICD-10-CM | POA: Diagnosis not present

## 2015-01-18 DIAGNOSIS — M955 Acquired deformity of pelvis: Secondary | ICD-10-CM | POA: Diagnosis not present

## 2015-01-18 DIAGNOSIS — M9905 Segmental and somatic dysfunction of pelvic region: Secondary | ICD-10-CM | POA: Diagnosis not present

## 2015-01-18 DIAGNOSIS — M5136 Other intervertebral disc degeneration, lumbar region: Secondary | ICD-10-CM | POA: Diagnosis not present

## 2015-02-06 DIAGNOSIS — H401132 Primary open-angle glaucoma, bilateral, moderate stage: Secondary | ICD-10-CM | POA: Diagnosis not present

## 2015-02-09 ENCOUNTER — Encounter: Payer: Commercial Managed Care - HMO | Admitting: Internal Medicine

## 2015-02-12 ENCOUNTER — Ambulatory Visit (INDEPENDENT_AMBULATORY_CARE_PROVIDER_SITE_OTHER): Payer: Commercial Managed Care - HMO | Admitting: Internal Medicine

## 2015-02-12 ENCOUNTER — Encounter: Payer: Self-pay | Admitting: Internal Medicine

## 2015-02-12 ENCOUNTER — Telehealth: Payer: Self-pay | Admitting: Internal Medicine

## 2015-02-12 VITALS — BP 110/70 | HR 63 | Temp 98.0°F | Ht 63.0 in | Wt 157.0 lb

## 2015-02-12 DIAGNOSIS — E785 Hyperlipidemia, unspecified: Secondary | ICD-10-CM

## 2015-02-12 DIAGNOSIS — Z0000 Encounter for general adult medical examination without abnormal findings: Secondary | ICD-10-CM | POA: Diagnosis not present

## 2015-02-12 DIAGNOSIS — G479 Sleep disorder, unspecified: Secondary | ICD-10-CM | POA: Diagnosis not present

## 2015-02-12 DIAGNOSIS — J301 Allergic rhinitis due to pollen: Secondary | ICD-10-CM | POA: Diagnosis not present

## 2015-02-12 DIAGNOSIS — N6009 Solitary cyst of unspecified breast: Secondary | ICD-10-CM

## 2015-02-12 LAB — CBC WITH DIFFERENTIAL/PLATELET
Basophils Absolute: 0 10*3/uL (ref 0.0–0.1)
Basophils Relative: 0.4 % (ref 0.0–3.0)
EOS ABS: 0.1 10*3/uL (ref 0.0–0.7)
Eosinophils Relative: 1.6 % (ref 0.0–5.0)
HCT: 38.3 % (ref 36.0–46.0)
HEMOGLOBIN: 12.7 g/dL (ref 12.0–15.0)
LYMPHS PCT: 37.6 % (ref 12.0–46.0)
Lymphs Abs: 1.9 10*3/uL (ref 0.7–4.0)
MCHC: 33.1 g/dL (ref 30.0–36.0)
MCV: 89.6 fl (ref 78.0–100.0)
Monocytes Absolute: 0.5 10*3/uL (ref 0.1–1.0)
Monocytes Relative: 10 % (ref 3.0–12.0)
Neutro Abs: 2.6 10*3/uL (ref 1.4–7.7)
Neutrophils Relative %: 50.4 % (ref 43.0–77.0)
Platelets: 223 10*3/uL (ref 150.0–400.0)
RBC: 4.27 Mil/uL (ref 3.87–5.11)
RDW: 13.4 % (ref 11.5–15.5)
WBC: 5.1 10*3/uL (ref 4.0–10.5)

## 2015-02-12 LAB — COMPREHENSIVE METABOLIC PANEL
ALBUMIN: 3.8 g/dL (ref 3.5–5.2)
ALT: 15 U/L (ref 0–35)
AST: 23 U/L (ref 0–37)
Alkaline Phosphatase: 63 U/L (ref 39–117)
BUN: 13 mg/dL (ref 6–23)
CALCIUM: 9.3 mg/dL (ref 8.4–10.5)
CO2: 29 meq/L (ref 19–32)
CREATININE: 0.64 mg/dL (ref 0.40–1.20)
Chloride: 102 mEq/L (ref 96–112)
GFR: 95.51 mL/min (ref >60.00)
Glucose, Bld: 95 mg/dL (ref 70–99)
POTASSIUM: 4.1 meq/L (ref 3.5–5.1)
Sodium: 137 mEq/L (ref 135–145)
Total Bilirubin: 0.5 mg/dL (ref 0.2–1.2)
Total Protein: 6.7 g/dL (ref 6.0–8.3)

## 2015-02-12 LAB — LIPID PANEL
Cholesterol: 161 mg/dL (ref 0–200)
HDL: 60.6 mg/dL (ref >39.00)
LDL Cholesterol: 87 mg/dL (ref 0–99)
NonHDL: 100.08
TRIGLYCERIDES: 67 mg/dL (ref 0.0–149.0)
Total CHOL/HDL Ratio: 3
VLDL: 13.4 mg/dL (ref 0.0–40.0)

## 2015-02-12 NOTE — Assessment & Plan Note (Signed)
I have personally reviewed the Medicare Annual Wellness questionnaire and have noted 1. The patient's medical and social history 2. Their use of alcohol, tobacco or illicit drugs 3. Their current medications and supplements 4. The patient's functional ability including ADL's, fall risks, home safety risks and hearing or visual             impairment. 5. Diet and physical activities 6. Evidence for depression or mood disorders  The patients weight, height, BMI and visual acuity have been recorded in the chart I have made referrals, counseling and provided education to the patient based review of the above and I have provided the pt with a written personalized care plan for preventive services.  I have provided you with a copy of your personalized plan for preventive services. Please take the time to review along with your updated medication list.  UTD on imms Wants to continue mammograms for now No colon--okay 4 years ago

## 2015-02-12 NOTE — Assessment & Plan Note (Signed)
She is okay with continuing primary prevention with statin for now

## 2015-02-12 NOTE — Progress Notes (Signed)
Pre visit review using our clinic review tool, if applicable. No additional management support is needed unless otherwise documented below in the visit note. 

## 2015-02-12 NOTE — Telephone Encounter (Signed)
Pt needs an order for a diagnosticmammogram - she was told this by [facility]. Please call [phone]  Thank you

## 2015-02-12 NOTE — Assessment & Plan Note (Signed)
Discussed trying OTC antihistamines

## 2015-02-12 NOTE — Progress Notes (Signed)
Subjective:    Patient ID: Sheryl Miller, female    DOB: 01/22/1936, 79 y.o.   MRN: [id number]  HPI Here for Medicare wellness and follow up of chronic medical conditions Reviewed form and advanced directives Reviewed other doctors No tobacco Glass of wine once a week or so Tries to exercise regularly 1 fall with minor injury No depression or anhedonia Independent with instrumental ADLs Mild vision problems-- trouble with replacement lens. Going back to ophtho Hearing is fine No significant memory problems  Right knee is better She feels glucosamine/chondroitin really helps  Sleeping better Will try hot milk--- then trazodone, but only taking 75mg  nightly satisifed with that  No problems with the statin No myalgias or GI problems  Current Outpatient Prescriptions on File Prior to Visit  Medication Sig Dispense Refill  . COMBIGAN 0.2-0.5 % ophthalmic solution Place 1 drop into both eyes every 12 (twelve) hours.     . Multiple Vitamins-Minerals (CENTRUM SILVER ULTRA WOMENS) TABS Take by mouth daily.    . simvastatin (ZOCOR) 40 MG tablet TAKE 1 TABLET AT BEDTIME 90 tablet 3  . traZODone (DESYREL) 150 MG tablet      No current facility-administered medications on file prior to visit.    No Known Allergies  Past Medical History  Diagnosis Date  . Raynaud's syndrome   . Osteoporosis, unspecified   . Diverticulosis of colon (without mention of hemorrhage)   . Female stress incontinence   . Internal hemorrhoids without mention of complication   . Unspecified constipation   . Esophageal reflux   . Other and unspecified hyperlipidemia   . Allergic rhinitis, cause unspecified   . Rheumatic fever   . Breast cyst     Past Surgical History  Procedure Laterality Date  . Bladder repair    . Abdominal hysterectomy    . Tonsillectomy    . Breast biopsy  2000    fibrocystic disease  . Shoulder surgery       In 2006, bilaterally shoulder surgery for rotator cuff.  .  Shoulder arthroscopy with rotator cuff repair and subacromial decompression  08/2001    R, Alphonzo Severance  . Tubal ligation    . Knee arthroscopy Right ~5/16    Family History  Problem Relation Age of Onset  . Coronary artery disease Mother   . Heart disease Mother   . Diabetes Mother   . COPD Father   . Breast cancer Sister     2 (age 64?, age 71?)  . Diabetes Brother   . Breast cancer Maternal Aunt     2  . Stroke Paternal Grandmother   . Diabetes Brother   . Breast cancer Daughter     ? at age 76    Social History   Social History  . Marital Status: Married    Spouse Name: N/A  . Number of Children: 4  . Years of Education: N/A   Occupational History  . retired Press photographer    Social History Main Topics  . Smoking status: Never Smoker   . Smokeless tobacco: Never Used  . Alcohol Use: No  . Drug Use: No  . Sexual Activity: Not on file   Other Topics Concern  . Not on file   Social History Narrative   Regular exercise-yes, walking or rides stationery bike daily      Has living will.    Husband should make health care decisions as needed, then son Herbie [place].    Would accept resuscitation but  no prolonged ventilation.    Would not want feeding tube if not cognitively aware               Review of Systems Appetite is good Weight is stable No heartburn Wears seat belt Teeth are fine---keeps up with dentist No chest pain or SOB Occasional palpitations---rare and not with exertion No dizziness or syncope Bowels are slow -- uses miralax occasionally Voids fine No joint problems other than the right knee AM congestion and some posterior head pain--discussed meds for allergies    Objective:   Physical Exam  Constitutional: She is oriented to person, place, and time. She appears well-developed and well-nourished. No distress.  HENT:  Mouth/Throat: Oropharynx is clear and moist. No oropharyngeal exudate.  Neck: Normal range of motion. Neck supple. No thyromegaly  present.  Cardiovascular: Normal rate, regular rhythm, normal heart sounds and intact distal pulses.  Exam reveals no gallop.   No murmur heard. Pulmonary/Chest: Effort normal and breath sounds normal. No respiratory distress. She has no wheezes. She has no rales.  Abdominal: Soft. There is no tenderness.  Musculoskeletal: She exhibits no edema or tenderness.  Mild effusion in right knee  Lymphadenopathy:    She has no cervical adenopathy.  Neurological: She is alert and oriented to person, place, and time.  President--- "Obama, ?" 100-93-? [id number] Recall 2/3  Skin: No rash noted. No erythema.  Psychiatric: She has a normal mood and affect. Her behavior is normal.          Assessment & Plan:

## 2015-02-12 NOTE — Patient Instructions (Addendum)
Please try cetirizine 10mg  or fexofenadine 180mg  at bedtime to see if it helps the phlegm in your throat and headache. Please set up your screening mammogram.

## 2015-02-12 NOTE — Assessment & Plan Note (Signed)
Doing well with trazodone 75mg  nightly now

## 2015-02-12 NOTE — Telephone Encounter (Signed)
Order placed

## 2015-02-14 NOTE — Telephone Encounter (Signed)
No  They need to start with the mammograms I don't agree she needs all that  I will send her somewhere else

## 2015-02-14 NOTE — Telephone Encounter (Signed)
Shirlean Mylar, please advise

## 2015-02-14 NOTE — Telephone Encounter (Signed)
For [facility] they also need limited left and right ultra sound for diagnostic mammograms Need order

## 2015-02-15 DIAGNOSIS — M5136 Other intervertebral disc degeneration, lumbar region: Secondary | ICD-10-CM | POA: Diagnosis not present

## 2015-02-15 DIAGNOSIS — M955 Acquired deformity of pelvis: Secondary | ICD-10-CM | POA: Diagnosis not present

## 2015-02-15 DIAGNOSIS — M9903 Segmental and somatic dysfunction of lumbar region: Secondary | ICD-10-CM | POA: Diagnosis not present

## 2015-02-15 DIAGNOSIS — M9905 Segmental and somatic dysfunction of pelvic region: Secondary | ICD-10-CM | POA: Diagnosis not present

## 2015-02-15 NOTE — Telephone Encounter (Signed)
Okay, thanks

## 2015-02-15 NOTE — Telephone Encounter (Signed)
I called [facility] to make appointment They stated pt was seen in 2014 and they recommended 6 month follow up with ultra sound on left breast Pt never went back for follow up They need limited left ultra sound before they will make appointment please put order in epic

## 2015-02-15 NOTE — Telephone Encounter (Signed)
Sheryl Miller called back from [name] after speaking to their radiologist, who said the the head Radiologist, Dr. Rosana Hoes will call to speak with me next week after he returns from [name] if needed.  I explained that if they are not willing to schedule diagnostic bilateral mammogram only, Dr. Silvio Pate will refer her to another facility.

## 2015-02-15 NOTE — Telephone Encounter (Signed)
Called and spoke to [place] at [facility].  She stated their recommendations and the need for an [place] for scheduling efficiency, I explained that PCP would not place order until absolutely sure needed.  We will give verbal orders to [facility] after diagnostic MM is completed and they are certain that the [place] is needed. She is escalating to Radiologist who will call be back and speak to Marlynn Perking or PCP.

## 2015-02-15 NOTE — Telephone Encounter (Signed)
Please call their manager. I don't think they get it. These radiologists seem to think they are immune to reasonable cost restraints, etc This woman is not sure about continuing at all 2 years ago they brought her back for multiple tests and didn't see anything---they may think 6 month follow up appropriate but I didn't After 2 years, if the mammogram is the same--they don't need to do an ultrasound. I would be happy to talk to one of the radiologists but we can't get past their scheduling rules. I would also be happy to call Ignatius Specking myself and express my displeasure about this arm twisting they do to make me order tests I don't think are appropriate!!

## 2015-02-15 NOTE — Telephone Encounter (Signed)
okay

## 2015-02-16 ENCOUNTER — Telehealth: Payer: Self-pay | Admitting: Internal Medicine

## 2015-02-16 NOTE — Telephone Encounter (Signed)
Dr.Reid called to discuss patient.  I let him know Dr.Letvak will be back in the office on Monday after 10:30.  Dr.Reid said he or Dr.Davis would call Dr.Letvak back on Monday.

## 2015-02-19 NOTE — Telephone Encounter (Signed)
Spoke to Dr Reid--he outlined the reasons why they needed the ultrasound order right away and stated it wouldn't be done if wasn't needed (I am highly unsure of that though) Will speak to patient to find out if she still wants to go ahead with this---message left at her home

## 2015-02-19 NOTE — Telephone Encounter (Signed)
Message left again.

## 2015-02-20 NOTE — Telephone Encounter (Signed)
Another message left

## 2015-02-21 NOTE — Telephone Encounter (Signed)
MRN:  [id number]   Description: 79 year old female  Provider: Venia Carbon, MD  Department: [facility]          Call Documentation      Venia Carbon, MD at 02/21/2015 7:43 AM     Status: Signed       Expand All Collapse All   I have not been able to reach the patient. I cannot find the order they want me to put in---- please find this and have it entered by someone and then let the patient know she is ready to schedule the exam. I firmly recommend that if they don't find anything this time--she should not have any more mammograms            Venia Carbon, MD at 02/20/2015 8:04 AM     Status: Signed       Expand All Collapse All   Another message left            Venia Carbon, MD at 02/19/2015 5:39 PM     Status: Signed       Expand All Collapse All   Message left again            Venia Carbon, MD at 02/19/2015 1:56 PM     Status: Signed       Expand All Collapse All   Spoke to Dr Reid--he outlined the reasons why they needed the ultrasound order right away and stated it wouldn't be done if wasn't needed (I am highly unsure of that though) Will speak to patient to find out if she still wants to go ahead with this---message left at her home            Camillia Herter at 02/16/2015 3:08 PM     Status: Signed       Expand All Collapse All   Dr.Reid called to discuss patient. I let him know Dr.Letvak will be back in the office on Monday after 10:30. Dr.Reid said he or Dr.Davis would call Dr.Letvak back on Monday.

## 2015-02-21 NOTE — Telephone Encounter (Signed)
Sheryl Miller can you put in limited right and left ultra sound

## 2015-02-21 NOTE — Telephone Encounter (Signed)
Normally I would, but because this has been such an issue I will forward to Dr. Silvio Pate.

## 2015-02-21 NOTE — Telephone Encounter (Signed)
I have not been able to reach the patient. I cannot find the order they want me to put in---- please find this and have it entered by someone and then let the patient know she is ready to schedule the exam. I firmly recommend that if they don't find anything this time--she should not have any more mammograms

## 2015-02-22 ENCOUNTER — Other Ambulatory Visit: Payer: Self-pay | Admitting: Internal Medicine

## 2015-02-22 DIAGNOSIS — N6002 Solitary cyst of left breast: Secondary | ICD-10-CM

## 2015-02-23 NOTE — Telephone Encounter (Signed)
Left message asking pt to call office  °

## 2015-02-26 ENCOUNTER — Ambulatory Visit (INDEPENDENT_AMBULATORY_CARE_PROVIDER_SITE_OTHER): Payer: Commercial Managed Care - HMO | Admitting: Primary Care

## 2015-02-26 ENCOUNTER — Encounter: Payer: Self-pay | Admitting: Primary Care

## 2015-02-26 VITALS — BP 120/66 | HR 66 | Temp 97.7°F | Ht 63.0 in | Wt 154.1 lb

## 2015-02-26 DIAGNOSIS — J301 Allergic rhinitis due to pollen: Secondary | ICD-10-CM

## 2015-02-26 NOTE — Progress Notes (Signed)
Subjective:    Patient ID: Sheryl Miller, female    DOB: 1935-06-22, 79 y.o.   MRN: [id number]  HPI  Sheryl Miller is a 79 year old female who presents today with a chief complaint of nasal congestion. Her symptoms began 5 days ago with rhinorrhea. She also reports sore throat, post nasal drip. Denies cough, fevers, nausea.  She's noticed a "spot" to her throat that she noticed 3 days ago. Denies difficulty swallowing. She's been using Flonase with improvement to her nasal congestion.   2) Numbness/Cramping: Occurs to her left hand, mostly to her left index finger for the past several months. Her left index finger will cramp and stiffen up which will require her to [facility] straighten her finger. This will occur once to twice monthly on average.   Review of Systems  Constitutional: Negative for fever and chills.  HENT: Positive for postnasal drip and rhinorrhea. Negative for congestion and sinus pressure.   Respiratory: Negative for shortness of breath.   Cardiovascular: Negative for chest pain.  Gastrointestinal: Negative for nausea.  Musculoskeletal:       Left index finger stiffness and cramping.       Past Medical History  Diagnosis Date  . Raynaud's syndrome   . Osteoporosis, unspecified   . Diverticulosis of colon (without mention of hemorrhage)   . Female stress incontinence   . Internal hemorrhoids without mention of complication   . Unspecified constipation   . Esophageal reflux   . Other and unspecified hyperlipidemia   . Allergic rhinitis, cause unspecified   . Rheumatic fever   . Breast cyst     Social History   Social History  . Marital Status: Married    Spouse Name: N/A  . Number of Children: 4  . Years of Education: N/A   Occupational History  . retired Press photographer    Social History Main Topics  . Smoking status: Never Smoker   . Smokeless tobacco: Never Used  . Alcohol Use: No  . Drug Use: No  . Sexual Activity: Not on file   Other Topics  Concern  . Not on file   Social History Narrative   Regular exercise-yes, walking or rides stationery bike daily      Has living will.    Husband should make health care decisions as needed, then son Herbie [place].    Would accept resuscitation but no prolonged ventilation.    Would not want feeding tube if not cognitively aware                Past Surgical History  Procedure Laterality Date  . Bladder repair    . Abdominal hysterectomy    . Tonsillectomy    . Breast biopsy  2000    fibrocystic disease  . Shoulder surgery       In 2006, bilaterally shoulder surgery for rotator cuff.  . Shoulder arthroscopy with rotator cuff repair and subacromial decompression  08/2001    R, Alphonzo Severance  . Tubal ligation    . Knee arthroscopy Right ~5/16    Family History  Problem Relation Age of Onset  . Coronary artery disease Mother   . Heart disease Mother   . Diabetes Mother   . COPD Father   . Breast cancer Sister     2 (age 64?, age 51?)  . Diabetes Brother   . Breast cancer Maternal Aunt     2  . Stroke Paternal Grandmother   . Diabetes Brother   .  Breast cancer Daughter     ? at age 43    No Known Allergies  Current Outpatient Prescriptions on File Prior to Visit  Medication Sig Dispense Refill  . COMBIGAN 0.2-0.5 % ophthalmic solution Place 1 drop into both eyes every 12 (twelve) hours.     . Multiple Vitamins-Minerals (CENTRUM SILVER ULTRA WOMENS) TABS Take by mouth daily.    . simvastatin (ZOCOR) 40 MG tablet TAKE 1 TABLET AT BEDTIME 90 tablet 3  . traZODone (DESYREL) 150 MG tablet Take 150 mg by mouth daily.      No current facility-administered medications on file prior to visit.    BP 120/66 mmHg  Pulse 66  Temp(Src) 97.7 F (36.5 C) (Oral)  Ht 5\' 3"  (1.6 m)  Wt 154 lb 1.9 oz (69.908 kg)  BMI 27.31 kg/m2  SpO2 97%    Objective:   Physical Exam  Constitutional: She appears well-nourished.  HENT:  Right Ear: Tympanic membrane and ear canal normal.  Left  Ear: Tympanic membrane and ear canal normal.  Nose: Mucosal edema present. Right sinus exhibits no maxillary sinus tenderness and no frontal sinus tenderness. Left sinus exhibits no maxillary sinus tenderness and no frontal sinus tenderness.  Mouth/Throat: Oropharynx is clear and moist.  Eyes: Conjunctivae are normal. Pupils are equal, round, and reactive to light.  Neck: Neck supple.  Cardiovascular: Normal rate and regular rhythm.   Pulmonary/Chest: Effort normal and breath sounds normal. She has no wheezes. She has no rales.  Lymphadenopathy:    She has no cervical adenopathy.  Skin: Skin is warm and dry.          Assessment & Plan:

## 2015-02-26 NOTE — Patient Instructions (Signed)
Nasal Congestion: Continue Fluticasone (Flonase) nasal spray. Instill 2 sprays in each nostril once daily.  Throat draingae: Continue Allegra.  Chest congestion/cough: Mucinex DM.   Increase consumption of fluids and rest.  It was a pleasure meeting you!  Allergic Rhinitis Allergic rhinitis is when the mucous membranes in the nose respond to allergens. Allergens are particles in the air that cause your body to have an allergic reaction. This causes you to release allergic antibodies. Through a chain of events, these eventually cause you to release histamine into the blood stream. Although meant to protect the body, it is this release of histamine that causes your discomfort, such as frequent sneezing, congestion, and an itchy, runny nose.  CAUSES Seasonal allergic rhinitis (hay fever) is caused by pollen allergens that may come from grasses, trees, and weeds. Year-round allergic rhinitis (perennial allergic rhinitis) is caused by allergens such as house dust mites, pet dander, and mold spores. SYMPTOMS  Nasal stuffiness (congestion).  Itchy, runny nose with sneezing and tearing of the eyes. DIAGNOSIS Your health care provider can help you determine the allergen or allergens that trigger your symptoms. If you and your health care provider are unable to determine the allergen, skin or blood testing may be used. Your health care provider will diagnose your condition after taking your health history and performing a physical exam. Your health care provider may assess you for other related conditions, such as asthma, pink eye, or an ear infection. TREATMENT Allergic rhinitis does not have a cure, but it can be controlled by:  Medicines that block allergy symptoms. These may include allergy shots, nasal sprays, and oral antihistamines.  Avoiding the allergen. Hay fever may often be treated with antihistamines in pill or nasal spray forms. Antihistamines block the effects of histamine. There are  over-the-counter medicines that may help with nasal congestion and swelling around the eyes. Check with your health care provider before taking or giving this medicine. If avoiding the allergen or the medicine prescribed do not work, there are many new medicines your health care provider can prescribe. Stronger medicine may be used if initial measures are ineffective. Desensitizing injections can be used if medicine and avoidance does not work. Desensitization is when a patient is given ongoing shots until the body becomes less sensitive to the allergen. Make sure you follow up with your health care provider if problems continue. HOME CARE INSTRUCTIONS It is not possible to completely avoid allergens, but you can reduce your symptoms by taking steps to limit your exposure to them. It helps to know exactly what you are allergic to so that you can avoid your specific triggers. SEEK MEDICAL CARE IF:  You have a fever.  You develop a cough that does not stop easily (persistent).  You have shortness of breath.  You start wheezing.  Symptoms interfere with normal daily activities.   This information is not intended to replace advice given to you by your health care provider. Make sure you discuss any questions you have with your health care provider.   Document Released: 11/19/2000 Document Revised: 03/17/2014 Document Reviewed: 11/01/2012 Elsevier Interactive Patient Education [employer].

## 2015-02-26 NOTE — Assessment & Plan Note (Signed)
Postnasal drip, nasal congestion, sore throat x 4 days. Improvement with flonase. No cough. Exam with mucosal edema to nares, lungs clear, no sinus tenderness. Suspect allergies to be cause. Will treat with supportive measures: Allegra, Flonase, Mucinex PRN. She is to call if no improvement by Friday.

## 2015-02-27 ENCOUNTER — Ambulatory Visit: Payer: Self-pay | Admitting: Internal Medicine

## 2015-02-28 NOTE — Telephone Encounter (Signed)
Appointment 12/30 [place]

## 2015-03-01 DIAGNOSIS — M5136 Other intervertebral disc degeneration, lumbar region: Secondary | ICD-10-CM | POA: Diagnosis not present

## 2015-03-01 DIAGNOSIS — M9903 Segmental and somatic dysfunction of lumbar region: Secondary | ICD-10-CM | POA: Diagnosis not present

## 2015-03-01 DIAGNOSIS — M955 Acquired deformity of pelvis: Secondary | ICD-10-CM | POA: Diagnosis not present

## 2015-03-01 DIAGNOSIS — H401132 Primary open-angle glaucoma, bilateral, moderate stage: Secondary | ICD-10-CM | POA: Diagnosis not present

## 2015-03-01 DIAGNOSIS — M9905 Segmental and somatic dysfunction of pelvic region: Secondary | ICD-10-CM | POA: Diagnosis not present

## 2015-03-02 DIAGNOSIS — M5136 Other intervertebral disc degeneration, lumbar region: Secondary | ICD-10-CM | POA: Diagnosis not present

## 2015-03-02 DIAGNOSIS — M9903 Segmental and somatic dysfunction of lumbar region: Secondary | ICD-10-CM | POA: Diagnosis not present

## 2015-03-02 DIAGNOSIS — M9905 Segmental and somatic dysfunction of pelvic region: Secondary | ICD-10-CM | POA: Diagnosis not present

## 2015-03-02 DIAGNOSIS — M955 Acquired deformity of pelvis: Secondary | ICD-10-CM | POA: Diagnosis not present

## 2015-03-05 ENCOUNTER — Emergency Department (HOSPITAL_COMMUNITY): Payer: Commercial Managed Care - HMO

## 2015-03-05 ENCOUNTER — Encounter (HOSPITAL_COMMUNITY): Payer: Self-pay | Admitting: *Deleted

## 2015-03-05 ENCOUNTER — Emergency Department (HOSPITAL_COMMUNITY)
Admission: EM | Admit: 2015-03-05 | Discharge: 2015-03-06 | Disposition: A | Discharge status: Home / Self Care | Payer: Commercial Managed Care - HMO | Attending: Emergency Medicine | Admitting: Emergency Medicine

## 2015-03-05 DIAGNOSIS — Z79899 Other long term (current) drug therapy: Secondary | ICD-10-CM | POA: Insufficient documentation

## 2015-03-05 DIAGNOSIS — Z8739 Personal history of other diseases of the musculoskeletal system and connective tissue: Secondary | ICD-10-CM | POA: Insufficient documentation

## 2015-03-05 DIAGNOSIS — Z87448 Personal history of other diseases of urinary system: Secondary | ICD-10-CM | POA: Diagnosis not present

## 2015-03-05 DIAGNOSIS — Z8679 Personal history of other diseases of the circulatory system: Secondary | ICD-10-CM | POA: Diagnosis not present

## 2015-03-05 DIAGNOSIS — M546 Pain in thoracic spine: Secondary | ICD-10-CM | POA: Diagnosis present

## 2015-03-05 DIAGNOSIS — M79602 Pain in left arm: Secondary | ICD-10-CM | POA: Diagnosis not present

## 2015-03-05 DIAGNOSIS — E785 Hyperlipidemia, unspecified: Secondary | ICD-10-CM | POA: Insufficient documentation

## 2015-03-05 DIAGNOSIS — M549 Dorsalgia, unspecified: Secondary | ICD-10-CM | POA: Diagnosis not present

## 2015-03-05 DIAGNOSIS — M25519 Pain in unspecified shoulder: Secondary | ICD-10-CM | POA: Diagnosis not present

## 2015-03-05 DIAGNOSIS — M62838 Other muscle spasm: Secondary | ICD-10-CM | POA: Diagnosis not present

## 2015-03-05 DIAGNOSIS — Z8719 Personal history of other diseases of the digestive system: Secondary | ICD-10-CM | POA: Insufficient documentation

## 2015-03-05 LAB — COMPREHENSIVE METABOLIC PANEL
ALT: 29 U/L (ref 14–54)
ANION GAP: 6 (ref 5–15)
AST: 34 U/L (ref 15–41)
Albumin: 3.8 g/dL (ref 3.5–5.0)
Alkaline Phosphatase: 67 U/L (ref 38–126)
BUN: 10 mg/dL (ref 6–20)
CHLORIDE: 101 mmol/L (ref 101–111)
CO2: 28 mmol/L (ref 22–32)
Calcium: 9.4 mg/dL (ref 8.9–10.3)
Creatinine, Ser: 0.61 mg/dL (ref 0.44–1.00)
Glucose, Bld: 101 mg/dL — ABNORMAL HIGH (ref 65–99)
POTASSIUM: 3.9 mmol/L (ref 3.5–5.1)
Sodium: 135 mmol/L (ref 135–145)
Total Bilirubin: 0.7 mg/dL (ref 0.3–1.2)
Total Protein: 6.5 g/dL (ref 6.5–8.1)

## 2015-03-05 LAB — CBC WITH DIFFERENTIAL/PLATELET
BASOS ABS: 0 10*3/uL (ref 0.0–0.1)
Basophils Relative: 0 %
EOS PCT: 2 %
Eosinophils Absolute: 0.1 10*3/uL (ref 0.0–0.7)
HCT: 37.6 % (ref 36.0–46.0)
Hemoglobin: 12.3 g/dL (ref 12.0–15.0)
LYMPHS PCT: 32 %
Lymphs Abs: 1.5 10*3/uL (ref 0.7–4.0)
MCH: 29.1 pg (ref 26.0–34.0)
MCHC: 32.7 g/dL (ref 30.0–36.0)
MCV: 88.9 fL (ref 78.0–100.0)
MONO ABS: 0.6 10*3/uL (ref 0.1–1.0)
Monocytes Relative: 12 %
Neutro Abs: 2.6 10*3/uL (ref 1.7–7.7)
Neutrophils Relative %: 54 %
PLATELETS: 226 10*3/uL (ref 150–400)
RBC: 4.23 MIL/uL (ref 3.87–5.11)
RDW: 12.9 % (ref 11.5–15.5)
WBC: 4.8 10*3/uL (ref 4.0–10.5)

## 2015-03-05 LAB — I-STAT TROPONIN, ED: TROPONIN I, POC: 0 ng/mL (ref 0.00–0.08)

## 2015-03-05 MED ORDER — DIAZEPAM 5 MG/ML IJ SOLN
2.5000 mg | Freq: Once | INTRAMUSCULAR | Status: AC
  Administered 2015-03-05: 2.5 mg via INTRAVENOUS
  Filled 2015-03-05: qty 2

## 2015-03-05 MED ORDER — MORPHINE SULFATE (PF) 4 MG/ML IV SOLN
4.0000 mg | Freq: Once | INTRAVENOUS | Status: AC
  Administered 2015-03-06: 4 mg via INTRAVENOUS
  Filled 2015-03-05: qty 1

## 2015-03-05 MED ORDER — MORPHINE SULFATE (PF) 2 MG/ML IV SOLN
2.0000 mg | Freq: Once | INTRAVENOUS | Status: AC
  Administered 2015-03-05: 2 mg via INTRAVENOUS
  Filled 2015-03-05: qty 1

## 2015-03-05 NOTE — ED Provider Notes (Signed)
CSN: [id number]     Arrival date & time 03/05/15  1745 History   First MD Initiated Contact with Patient 03/05/15 2141     Chief Complaint  Patient presents with  . Back Pain     (Consider location/radiation/quality/duration/timing/severity/associated sxs/prior Treatment) HPI Comments: 79 year old female with history of nods, osteoporosis, esophageal reflux presents for back pain. The patient reports that over the last 6 or more days she has had pain in her left upper back that goes down her left arm. She reports that the pain is reproducible. She denies chest pain or shortness of breath. The patient has been helping her husband who underwent recent surgery with getting up and lifting. She has been needing to assist him out of chairs. The patient denies chest pain, shortness of breath, fever, chills, cough. The patient tried going to a chiropractor without any relief. She said she has been trying Tylenol at home for this. The pain has been slowly progressive. She said that the pain is reproducible when you palpate posterior left shoulder. Denies any focal weakness although the pain does radiate down her left arm.   Past Medical History  Diagnosis Date  . Raynaud's syndrome   . Osteoporosis, unspecified   . Diverticulosis of colon (without mention of hemorrhage)   . Female stress incontinence   . Internal hemorrhoids without mention of complication   . Unspecified constipation   . Esophageal reflux   . Other and unspecified hyperlipidemia   . Allergic rhinitis, cause unspecified   . Rheumatic fever   . Breast cyst    Past Surgical History  Procedure Laterality Date  . Bladder repair    . Abdominal hysterectomy    . Tonsillectomy    . Breast biopsy  2000    fibrocystic disease  . Shoulder surgery       In 2006, bilaterally shoulder surgery for rotator cuff.  . Shoulder arthroscopy with rotator cuff repair and subacromial decompression  08/2001    R, Alphonzo Severance  . Tubal ligation      . Knee arthroscopy Right ~5/16   Family History  Problem Relation Age of Onset  . Coronary artery disease Mother   . Heart disease Mother   . Diabetes Mother   . COPD Father   . Breast cancer Sister     2 (age 46?, age 39?)  . Diabetes Brother   . Breast cancer Maternal Aunt     2  . Stroke Paternal Grandmother   . Diabetes Brother   . Breast cancer Daughter     ? at age 9   Social History  Substance Use Topics  . Smoking status: Never Smoker   . Smokeless tobacco: Never Used  . Alcohol Use: No   OB History    Gravida Para Term Preterm AB TAB SAB Ectopic Multiple Living   4 4        4       Obstetric Comments   1st Menstrual Cycle: 14 1st Pregnancy: 23     Review of Systems  Constitutional: Negative for fever, chills, activity change, appetite change and fatigue.  HENT: Negative for congestion, postnasal drip, rhinorrhea and sinus pressure.   Respiratory: Negative for cough, chest tightness and shortness of breath.   Cardiovascular: Negative for chest pain and palpitations.  Gastrointestinal: Negative for nausea, vomiting, abdominal pain and diarrhea.  Genitourinary: Negative for dysuria, urgency and hematuria.  Musculoskeletal: Positive for back pain. Negative for myalgias and neck pain.  Skin: Negative for  rash.  Neurological: Negative for dizziness, weakness, numbness and headaches.  Hematological: Does not bruise/bleed easily.      Allergies  Review of patient's allergies indicates no known allergies.  Home Medications   Prior to Admission medications   Medication Sig Start Date End Date Taking? Authorizing Provider  COMBIGAN 0.2-0.5 % ophthalmic solution Place 1 drop into both eyes every 12 (twelve) hours. Reported on 03/05/2015 10/29/12  Yes Historical Provider, MD  Multiple Vitamins-Minerals (CENTRUM SILVER ULTRA WOMENS) TABS Take 1 tablet by mouth daily.    Yes Historical Provider, MD  simvastatin (ZOCOR) 40 MG tablet TAKE 1 TABLET AT BEDTIME 04/03/14   Yes Venia Carbon, MD  traZODone (DESYREL) 150 MG tablet Take 150 mg by mouth daily.  12/13/14  Yes Historical Provider, MD  diazepam (VALIUM) 5 MG tablet Take 1 tablet (5 mg total) by mouth every 6 (six) hours as needed for muscle spasms. 03/06/15   Harvel Quale, MD  HYDROcodone-acetaminophen (NORCO) 5-325 MG tablet Take 1 tablet by mouth every 6 (six) hours as needed for moderate pain or severe pain. 03/06/15   Harvel Quale, MD  naproxen (NAPROSYN) 500 MG tablet Take 1 tablet (500 mg total) by mouth 2 (two) times daily. 03/06/15   Harvel Quale, MD   BP 145/79 mmHg  Pulse 97  Temp(Src) 97.7 F (36.5 C) (Oral)  Resp 15  Ht 5\' 3"  (1.6 m)  Wt 150 lb (68.04 kg)  BMI 26.58 kg/m2  SpO2 100% Physical Exam  Constitutional: She is oriented to person, place, and time. She appears well-developed and well-nourished. No distress.  HENT:  Head: Normocephalic and atraumatic.  Right Ear: External ear normal.  Left Ear: External ear normal.  Nose: Nose normal.  Mouth/Throat: Oropharynx is clear and moist. No oropharyngeal exudate.  Eyes: EOM are normal. Pupils are equal, round, and reactive to light.  Neck: Normal range of motion. Neck supple.  Cardiovascular: Normal rate, regular rhythm, normal heart sounds and intact distal pulses.   No murmur heard. Pulses:      Radial pulses are 2+ on the right side, and 2+ on the left side.  Pulmonary/Chest: Effort normal. No respiratory distress. She has no wheezes. She has no rales.  Abdominal: Soft. She exhibits no distension. There is no tenderness.  Musculoskeletal: She exhibits no edema.       Right shoulder: Normal.       Left shoulder: She exhibits decreased range of motion (mildly decreased global range of motion secondary to pain but joint is not irritable), tenderness and pain. She exhibits normal pulse and normal strength.       Arms: Neurological: She is alert and oriented to person, place, and time. She has normal strength. No  sensory deficit.  Skin: Skin is warm and dry. No rash noted. She is not diaphoretic.  Vitals reviewed.   ED Course  Procedures (including critical care time) Labs Review Labs Reviewed  COMPREHENSIVE METABOLIC PANEL - Abnormal; Notable for the following:    Glucose, Bld 101 (*)    All other components within normal limits  CBC WITH DIFFERENTIAL/PLATELET  Randolm Idol, ED    Imaging Review Dg Chest 2 View  03/05/2015  CLINICAL DATA:  80 year old female with left upper back and left arm pain. EXAM: CHEST  2 VIEW COMPARISON:  Radiograph dated 06/23/2012 FINDINGS: The heart size and mediastinal contours are within normal limits. Both lungs are clear. The visualized skeletal structures are unremarkable. IMPRESSION: No active cardiopulmonary disease. Electronically  Signed   By: Anner Crete M.D.   On: 03/05/2015 22:36   Ct Cervical Spine Wo Contrast  03/05/2015  CLINICAL DATA:  Acute onset of left arm pain for 6 days, and pain between the scapulae. Initial encounter. EXAM: CT CERVICAL SPINE WITHOUT CONTRAST CT THORACIC SPINE WITHOUT CONTRAST TECHNIQUE: Multidetector CT imaging of the cervical and thoracic spine was performed without contrast. Multiplanar CT image reconstructions were also generated. COMPARISON:  Chest radiograph performed earlier today at 10:19 p.m. FINDINGS: CT CERVICAL SPINE FINDINGS There is no evidence of acute fracture or subluxation along the cervical spine. Vertebral bodies demonstrate normal height. Multilevel disc space narrowing is noted along the mid to lower cervical spine, with scattered anterior and posterior disc osteophyte complexes. There is minimal grade 1 anterolisthesis of C7 on T1, reflecting underlying facet disease. Prevertebral soft tissues are within normal limits. Scattered partially calcified nodules are seen within the thyroid gland, with hypodensities measuring up to 1.4 cm in size. Mild scarring is noted at the lung apices. No additional soft  tissue abnormalities are seen. The visualized portions of the brain are grossly unremarkable. CT THORACIC SPINE FINDINGS There is no evidence of acute fracture or subluxation along the thoracic spine. There is chronic loss of height involving the superior endplate of vertebral body T3. Vertebral bodies demonstrate normal alignment. Intervertebral disc spaces are grossly preserved. The bony foramina are grossly unremarkable in appearance. Relatively diffuse coronary artery calcifications are seen. Scattered calcification is noted along the thoracic aorta. A mosaic pattern of parenchymal attenuation is noted within the visualized lung fields. The lungs are otherwise grossly clear. No definite mediastinal lymphadenopathy is seen. The visualized portions of the abdomen are grossly unremarkable. IMPRESSION: 1. No evidence of acute fracture or subluxation along the cervical or thoracic spine. 2. Chronic loss of height involving the superior endplate of vertebral body T3. 3. Mild degenerative change along the mid to lower cervical spine. 4. Mild scarring at the lung apices. 5. Relatively diffuse coronary artery calcifications seen. 6. Mosaic pattern of parenchymal attenuation within the visualized lung fields; this is nonspecific and may reflect a variety of etiologies. 7. Scattered partially calcified nodes within the thyroid gland, measuring up to 1.4 cm in size. Consider further evaluation with thyroid ultrasound. If patient is clinically hyperthyroid, consider nuclear medicine thyroid uptake and scan. Electronically Signed   By: Garald Balding M.D.   On: 03/05/2015 23:26   Ct Thoracic Spine Wo Contrast  03/05/2015  CLINICAL DATA:  Acute onset of left arm pain for 6 days, and pain between the scapulae. Initial encounter. EXAM: CT CERVICAL SPINE WITHOUT CONTRAST CT THORACIC SPINE WITHOUT CONTRAST TECHNIQUE: Multidetector CT imaging of the cervical and thoracic spine was performed without contrast. Multiplanar CT  image reconstructions were also generated. COMPARISON:  Chest radiograph performed earlier today at 10:19 p.m. FINDINGS: CT CERVICAL SPINE FINDINGS There is no evidence of acute fracture or subluxation along the cervical spine. Vertebral bodies demonstrate normal height. Multilevel disc space narrowing is noted along the mid to lower cervical spine, with scattered anterior and posterior disc osteophyte complexes. There is minimal grade 1 anterolisthesis of C7 on T1, reflecting underlying facet disease. Prevertebral soft tissues are within normal limits. Scattered partially calcified nodules are seen within the thyroid gland, with hypodensities measuring up to 1.4 cm in size. Mild scarring is noted at the lung apices. No additional soft tissue abnormalities are seen. The visualized portions of the brain are grossly unremarkable. CT THORACIC SPINE FINDINGS There is  no evidence of acute fracture or subluxation along the thoracic spine. There is chronic loss of height involving the superior endplate of vertebral body T3. Vertebral bodies demonstrate normal alignment. Intervertebral disc spaces are grossly preserved. The bony foramina are grossly unremarkable in appearance. Relatively diffuse coronary artery calcifications are seen. Scattered calcification is noted along the thoracic aorta. A mosaic pattern of parenchymal attenuation is noted within the visualized lung fields. The lungs are otherwise grossly clear. No definite mediastinal lymphadenopathy is seen. The visualized portions of the abdomen are grossly unremarkable. IMPRESSION: 1. No evidence of acute fracture or subluxation along the cervical or thoracic spine. 2. Chronic loss of height involving the superior endplate of vertebral body T3. 3. Mild degenerative change along the mid to lower cervical spine. 4. Mild scarring at the lung apices. 5. Relatively diffuse coronary artery calcifications seen. 6. Mosaic pattern of parenchymal attenuation within the  visualized lung fields; this is nonspecific and may reflect a variety of etiologies. 7. Scattered partially calcified nodes within the thyroid gland, measuring up to 1.4 cm in size. Consider further evaluation with thyroid ultrasound. If patient is clinically hyperthyroid, consider nuclear medicine thyroid uptake and scan. Electronically Signed   By: Garald Balding M.D.   On: 03/05/2015 23:26   I have personally reviewed and evaluated these images and lab results as part of my medical decision-making.   EKG Interpretation   Date/Time:  Monday March 05 2015 23:52:11 EST Ventricular Rate:  68 PR Interval:  205 QRS Duration: 123 QT Interval:  387 QTC Calculation: 411 R Axis:   70 Text Interpretation:  Sinus rhythm Nonspecific intraventricular conduction  delay Previous EKG apparently not patient's per tech notes, EKG  unremarkable from prior Confirmed by Yacine Garriga ([id number]) on 03/06/2015  12:06:00 AM      MDM  Patient was seen and evaluated in stable condition. Patient with reproducible pain in her left shoulder. EKG and troponin unremarkable. Chest x-ray unremarkable. CT of the cervical spine as well as a the thoracic spine without acute process. Aorta appeared normal caliber. At this time presentation did not seem consistent with vascular, cardiac, neurologic injury. Patient with normal strength and sensation. Discussed all results and clinical impression at length with patient and her son at bedside. Patient was discharged home in stable condition with instruction to follow up with her primary care physician and with prescriptions for naproxen, Valium, Norco. She was told to not take all medications at the same time and to not drive or operate heavy machinery after taking the Norco or the Valium. She was given strict return precautions. Final diagnoses:  Muscle spasm    1. Back pain, likely muscle spasm/strain    Harvel Quale, MD 03/06/15 [phone]

## 2015-03-05 NOTE — ED Notes (Signed)
EKG done at 1915 was not performed on this pt.

## 2015-03-05 NOTE — ED Notes (Signed)
Pt reports left upper back pain and left arm pain that started 5 days ago. Pt state that she was [facility]. Pt states that the pain is constant.

## 2015-03-06 ENCOUNTER — Telehealth: Payer: Self-pay | Admitting: *Deleted

## 2015-03-06 DIAGNOSIS — E785 Hyperlipidemia, unspecified: Secondary | ICD-10-CM | POA: Diagnosis not present

## 2015-03-06 DIAGNOSIS — M62838 Other muscle spasm: Secondary | ICD-10-CM | POA: Diagnosis not present

## 2015-03-06 DIAGNOSIS — Z8679 Personal history of other diseases of the circulatory system: Secondary | ICD-10-CM | POA: Diagnosis not present

## 2015-03-06 DIAGNOSIS — Z8719 Personal history of other diseases of the digestive system: Secondary | ICD-10-CM | POA: Diagnosis not present

## 2015-03-06 DIAGNOSIS — Z87448 Personal history of other diseases of urinary system: Secondary | ICD-10-CM | POA: Diagnosis not present

## 2015-03-06 DIAGNOSIS — Z8739 Personal history of other diseases of the musculoskeletal system and connective tissue: Secondary | ICD-10-CM | POA: Diagnosis not present

## 2015-03-06 DIAGNOSIS — Z79899 Other long term (current) drug therapy: Secondary | ICD-10-CM | POA: Diagnosis not present

## 2015-03-06 MED ORDER — KETOROLAC TROMETHAMINE 30 MG/ML IJ SOLN
30.0000 mg | Freq: Once | INTRAMUSCULAR | Status: AC
  Administered 2015-03-06: 30 mg via INTRAVENOUS
  Filled 2015-03-06: qty 1

## 2015-03-06 MED ORDER — DIAZEPAM 5 MG PO TABS: 5.0000 mg | ORAL_TABLET | Freq: Four times a day (QID) | ORAL | Status: DC | PRN

## 2015-03-06 MED ORDER — NAPROXEN 500 MG PO TABS: 500.0000 mg | ORAL_TABLET | Freq: Two times a day (BID) | ORAL | Status: DC

## 2015-03-06 MED ORDER — DEXAMETHASONE SODIUM PHOSPHATE 10 MG/ML IJ SOLN
10.0000 mg | Freq: Once | INTRAMUSCULAR | Status: AC
  Administered 2015-03-06: 10 mg via INTRAVENOUS
  Filled 2015-03-06: qty 1

## 2015-03-06 MED ORDER — HYDROCODONE-ACETAMINOPHEN 5-325 MG PO TABS: 1.0000 | ORAL_TABLET | Freq: Four times a day (QID) | ORAL | Status: DC | PRN

## 2015-03-06 NOTE — Telephone Encounter (Signed)
Spoke with patient and she's waiting on her husband to come back with the medication, she's still in pain, I advised that she call back with a follow-up to let us know how she's doing. Pt will call back for appt.

## 2015-03-06 NOTE — Discharge Instructions (Signed)
He was seen today for the pain in her back and down her left arm. At this time this does not appear to be anything life threatening. This does need follow-up with her primary care physician. Take the medications prescribed to help with symptom control and also use ice or heat whichever feels better for continued symptom control. Try to avoid heavy lifting with the arm. Return with chest pain, shortness of breath, weakness in your arm.  Muscle Cramps and Spasms Muscle cramps and spasms occur when a muscle or muscles tighten and you have no control over this tightening (involuntary muscle contraction). They are a common problem and can develop in any muscle. The most common place is in the calf muscles of the leg. Both muscle cramps and muscle spasms are involuntary muscle contractions, but they also have differences:   Muscle cramps are sporadic and painful. They may last a few seconds to a quarter of an hour. Muscle cramps are often more forceful and last longer than muscle spasms.  Muscle spasms may or may not be painful. They may also last just a few seconds or much longer. CAUSES  It is uncommon for cramps or spasms to be due to a serious underlying problem. In many cases, the cause of cramps or spasms is unknown. Some common causes are:   Overexertion.   Overuse from repetitive motions (doing the same thing over and over).   Remaining in a certain position for a long period of time.   Improper preparation, form, or technique while performing a sport or activity.   Dehydration.   Injury.   Side effects of some medicines.   Abnormally low levels of the salts and ions in your blood (electrolytes), especially potassium and calcium. This could happen if you are taking water pills (diuretics) or you are pregnant.  Some underlying medical problems can make it more likely to develop cramps or spasms. These include, but are not limited to:   Diabetes.   Parkinson disease.    Hormone disorders, such as thyroid problems.   Alcohol abuse.   Diseases specific to muscles, joints, and bones.   Blood vessel disease where not enough blood is getting to the muscles.  HOME CARE INSTRUCTIONS   Stay well hydrated. Drink enough water and fluids to keep your urine clear or pale yellow.  It may be helpful to massage, stretch, and relax the affected muscle.  For tight or tense muscles, use a warm towel, heating pad, or hot shower water directed to the affected area.  If you are sore or have pain after a cramp or spasm, applying ice to the affected area may relieve discomfort.  Put ice in a plastic bag.  Place a towel between your skin and the bag.  Leave the ice on for 15-20 minutes, 03-04 times a day.  Medicines used to treat a known cause of cramps or spasms may help reduce their frequency or severity. Only take over-the-counter or prescription medicines as directed by your caregiver. SEEK MEDICAL CARE IF:  Your cramps or spasms get more severe, more frequent, or do not improve over time.  MAKE SURE YOU:   Understand these instructions.  Will watch your condition.  Will get help right away if you are not doing well or get worse.   This information is not intended to replace advice given to you by your health care provider. Make sure you discuss any questions you have with your health care provider.   Document Released: 08/16/2001  Document Revised: 06/21/2012 Document Reviewed: 02/11/2012 Elsevier Interactive Patient Education [employer].

## 2015-03-06 NOTE — ED Notes (Signed)
Pt st's no relief from previous pain meds.  Continues to rate pain a 9 on pain scale 0/10

## 2015-03-06 NOTE — Telephone Encounter (Signed)
Okay 

## 2015-03-08 ENCOUNTER — Ambulatory Visit (INDEPENDENT_AMBULATORY_CARE_PROVIDER_SITE_OTHER): Payer: Commercial Managed Care - HMO | Admitting: Internal Medicine

## 2015-03-08 ENCOUNTER — Encounter: Payer: Self-pay | Admitting: Internal Medicine

## 2015-03-08 VITALS — BP 110/60 | HR 67 | Temp 98.1°F | Wt 159.0 lb

## 2015-03-08 DIAGNOSIS — M79602 Pain in left arm: Secondary | ICD-10-CM

## 2015-03-08 NOTE — Progress Notes (Signed)
Pre visit review using our clinic review tool, if applicable. No additional management support is needed unless otherwise documented below in the visit note. 

## 2015-03-08 NOTE — Assessment & Plan Note (Addendum)
Severe with minimal help from the meds Not burning but could be cervical radiculopathy--but doesn't explain pain back in scapula Doesn't really seem to be muscular Shoulder seems fine Will set up with ortho for another opinion---?brachial plexus injury??

## 2015-03-08 NOTE — Progress Notes (Signed)
Subjective:    Patient ID: Sheryl Miller, female    DOB: [birth year], 79 y.o.   MRN: [id number]  HPI Here for ED follow up Reviewed MD notes and results of testing  Not sure exactly when her pain started--- might have been before her husband's surgery 2 weeks ago Pain is constant along posterior left arm past elbow to wrist Pain also along low thoracic back on left Suddenly worsened prompting ER visit  meds not helping much--and wear off quick (naproxen, hydrocodone, diazepam) Pain is throbbing--not burning Heat did help some  Slight arm weakness No falls or injury No neck pain Did go to chiropractor--- she could feel this pain when he adjusted her neck  Current Outpatient Prescriptions on File Prior to Visit  Medication Sig Dispense Refill  . COMBIGAN 0.2-0.5 % ophthalmic solution Place 1 drop into both eyes every 12 (twelve) hours. Reported on 03/05/2015    . diazepam (VALIUM) 5 MG tablet Take 1 tablet (5 mg total) by mouth every 6 (six) hours as needed for muscle spasms. 10 tablet 0  . HYDROcodone-acetaminophen (NORCO) 5-325 MG tablet Take 1 tablet by mouth every 6 (six) hours as needed for moderate pain or severe pain. 10 tablet 0  . Multiple Vitamins-Minerals (CENTRUM SILVER ULTRA WOMENS) TABS Take 1 tablet by mouth daily.     . naproxen (NAPROSYN) 500 MG tablet Take 1 tablet (500 mg total) by mouth 2 (two) times daily. 10 tablet 0  . simvastatin (ZOCOR) 40 MG tablet TAKE 1 TABLET AT BEDTIME 90 tablet 3  . traZODone (DESYREL) 150 MG tablet Take 150 mg by mouth daily.      No current facility-administered medications on file prior to visit.    No Known Allergies  Past Medical History  Diagnosis Date  . Raynaud's syndrome   . Osteoporosis, unspecified   . Diverticulosis of colon (without mention of hemorrhage)   . Female stress incontinence   . Internal hemorrhoids without mention of complication   . Unspecified constipation   . Esophageal reflux   . Other and  unspecified hyperlipidemia   . Allergic rhinitis, cause unspecified   . Rheumatic fever   . Breast cyst     Past Surgical History  Procedure Laterality Date  . Bladder repair    . Abdominal hysterectomy    . Tonsillectomy    . Breast biopsy  2000    fibrocystic disease  . Shoulder surgery       In 2006, bilaterally shoulder surgery for rotator cuff.  . Shoulder arthroscopy with rotator cuff repair and subacromial decompression  08/2001    R, Alphonzo Severance  . Tubal ligation    . Knee arthroscopy Right ~5/16    Family History  Problem Relation Age of Onset  . Coronary artery disease Mother   . Heart disease Mother   . Diabetes Mother   . COPD Father   . Breast cancer Sister     2 (age 105?, age 14?)  . Diabetes Brother   . Breast cancer Maternal Aunt     2  . Stroke Paternal Grandmother   . Diabetes Brother   . Breast cancer Daughter     ? at age 81    Social History   Social History  . Marital Status: Married    Spouse Name: N/A  . Number of Children: 4  . Years of Education: N/A   Occupational History  . retired Press photographer    Social History Main Topics  .  Smoking status: Never Smoker   . Smokeless tobacco: Never Used  . Alcohol Use: No  . Drug Use: No  . Sexual Activity: Not on file   Other Topics Concern  . Not on file   Social History Narrative   Regular exercise-yes, walking or rides stationery bike daily      Has living will.    Husband should make health care decisions as needed, then son Herbie [place].    Would accept resuscitation but no prolonged ventilation.    Would not want feeding tube if not cognitively aware               Review of Systems  No fever No cough No SOB Just feels weak     Objective:   Physical Exam  Constitutional: She appears well-developed. No distress.  Neck: Normal range of motion. Neck supple.  Pulmonary/Chest: Effort normal and breath sounds normal. No respiratory distress. She has no wheezes. She has no rales.  Pain  area over left scapula--but no tenderness  Musculoskeletal:  Pain area is along triceps on left--but no tenderness Fairly normal ROM at left shoulder--doesn't cause pain  Lymphadenopathy:    She has no cervical adenopathy.  Neurological:  Normal strength in arms          Assessment & Plan:

## 2015-03-09 ENCOUNTER — Ambulatory Visit: Admission: RE | Admit: 2015-03-09 | Payer: Commercial Managed Care - HMO | Source: Ambulatory Visit

## 2015-03-09 ENCOUNTER — Ambulatory Visit: Payer: Self-pay | Admitting: Internal Medicine

## 2015-03-09 ENCOUNTER — Other Ambulatory Visit: Payer: Self-pay

## 2015-03-09 DIAGNOSIS — M5412 Radiculopathy, cervical region: Secondary | ICD-10-CM | POA: Diagnosis not present

## 2015-03-15 DIAGNOSIS — M542 Cervicalgia: Secondary | ICD-10-CM | POA: Diagnosis not present

## 2015-03-20 DIAGNOSIS — M542 Cervicalgia: Secondary | ICD-10-CM | POA: Diagnosis not present

## 2015-04-03 ENCOUNTER — Ambulatory Visit
Admission: RE | Admit: 2015-04-03 | Discharge: 2015-04-03 | Disposition: A | Discharge status: Home / Self Care | Payer: Commercial Managed Care - HMO | Source: Ambulatory Visit | Attending: Internal Medicine | Admitting: Internal Medicine

## 2015-04-03 DIAGNOSIS — N6002 Solitary cyst of left breast: Secondary | ICD-10-CM

## 2015-04-03 DIAGNOSIS — N6009 Solitary cyst of unspecified breast: Secondary | ICD-10-CM

## 2015-04-03 DIAGNOSIS — R928 Other abnormal and inconclusive findings on diagnostic imaging of breast: Secondary | ICD-10-CM | POA: Diagnosis not present

## 2015-04-14 ENCOUNTER — Other Ambulatory Visit: Payer: Self-pay | Admitting: Internal Medicine

## 2015-05-01 ENCOUNTER — Encounter: Payer: Self-pay | Admitting: Internal Medicine

## 2015-05-11 DIAGNOSIS — M542 Cervicalgia: Secondary | ICD-10-CM | POA: Diagnosis not present

## 2015-05-14 DIAGNOSIS — M542 Cervicalgia: Secondary | ICD-10-CM | POA: Diagnosis not present

## 2015-05-18 DIAGNOSIS — M542 Cervicalgia: Secondary | ICD-10-CM | POA: Diagnosis not present

## 2015-05-23 DIAGNOSIS — M542 Cervicalgia: Secondary | ICD-10-CM | POA: Diagnosis not present

## 2015-05-25 DIAGNOSIS — M542 Cervicalgia: Secondary | ICD-10-CM | POA: Diagnosis not present

## 2015-05-29 DIAGNOSIS — M542 Cervicalgia: Secondary | ICD-10-CM | POA: Diagnosis not present

## 2015-05-30 ENCOUNTER — Other Ambulatory Visit: Payer: Self-pay | Admitting: Internal Medicine

## 2015-05-30 NOTE — Telephone Encounter (Signed)
Rx sent electronically.  

## 2015-05-30 NOTE — Telephone Encounter (Signed)
Last refill 12-13-14 #90/0 Last OV 03-08-15 Next OV 02-13-16

## 2015-05-30 NOTE — Telephone Encounter (Signed)
Approved: okay for a year 

## 2015-06-01 DIAGNOSIS — M542 Cervicalgia: Secondary | ICD-10-CM | POA: Diagnosis not present

## 2015-06-05 DIAGNOSIS — M542 Cervicalgia: Secondary | ICD-10-CM | POA: Diagnosis not present

## 2015-06-07 DIAGNOSIS — M542 Cervicalgia: Secondary | ICD-10-CM | POA: Diagnosis not present

## 2015-07-09 ENCOUNTER — Encounter: Payer: Self-pay | Admitting: Podiatry

## 2015-07-09 ENCOUNTER — Ambulatory Visit (INDEPENDENT_AMBULATORY_CARE_PROVIDER_SITE_OTHER): Payer: Commercial Managed Care - HMO

## 2015-07-09 ENCOUNTER — Ambulatory Visit (INDEPENDENT_AMBULATORY_CARE_PROVIDER_SITE_OTHER): Payer: Commercial Managed Care - HMO | Admitting: Podiatry

## 2015-07-09 VITALS — BP 123/68 | HR 69 | Resp 12

## 2015-07-09 DIAGNOSIS — M722 Plantar fascial fibromatosis: Secondary | ICD-10-CM

## 2015-07-09 DIAGNOSIS — Q828 Other specified congenital malformations of skin: Secondary | ICD-10-CM | POA: Diagnosis not present

## 2015-07-09 DIAGNOSIS — L600 Ingrowing nail: Secondary | ICD-10-CM | POA: Diagnosis not present

## 2015-07-09 NOTE — Progress Notes (Signed)
   Subjective:    Patient ID: Sheryl Miller, female    DOB: Jul 04, 1935, 80 y.o.   MRN: [id number]  HPI: She states the bottom of the right heels been hurting for the last couple weeks she denies any trauma and states that nothing seems to be helping it.  Review of Systems  Eyes: Positive for redness.  Musculoskeletal: Positive for myalgias.       Objective:   Physical Exam: Vital signs are stable she is alert and oriented 3 pulses are palpable. Neurologic sensorium is intact per Semmes-Weinstein monofilament deep tendon reflexes are intact. Muscle strength +5 over 5 dorsiflexion plantar flexors and inverters everters on physical musculature is intact. Orthopedic evaluation demonstrates all joints distal to the ankle range of motion without crepitation. She has pain on palpation mucogingival the right foot. Radiographs do demonstrate soft tissue increasing dysphagia and plantar fascia calcaneal insertion site. A small portion of the spur also may be fracture. Calcaneus does not appear to be fractured. Cutaneous evaluation Demonstrates no open regions or wounds. Sharp incurvated nail margins tibial and fibular border of the hallux right. No signs of infection. Solitary porokeratotic lesion sub-fifth metatarsal head of the right foot. Does not appear to be verrucoid in nature.        Assessment & Plan:  Assessment: Pain in limb secondary to plantar fasciitis right foot.Ingrown toenail hallux right. Porokeratosis of fifth metatarsal right foot.  Plan: I injected the right heel today with Kenalog and local anesthetic placed her In a plantar fascia brace and a night splint. Discussed appropriate shoe gear stretching exercises ice therapy issue modifications. We may need to put her on oral medication .

## 2015-07-19 ENCOUNTER — Encounter: Payer: Self-pay | Admitting: Internal Medicine

## 2015-07-19 NOTE — Telephone Encounter (Signed)
Please call her to offer an appt today or tomorrow

## 2015-07-20 ENCOUNTER — Encounter: Payer: Self-pay | Admitting: Family Medicine

## 2015-07-20 ENCOUNTER — Ambulatory Visit (INDEPENDENT_AMBULATORY_CARE_PROVIDER_SITE_OTHER): Payer: Commercial Managed Care - HMO | Admitting: Family Medicine

## 2015-07-20 VITALS — BP 118/60 | HR 62 | Temp 97.6°F | Ht 63.0 in | Wt 152.5 lb

## 2015-07-20 DIAGNOSIS — G4762 Sleep related leg cramps: Secondary | ICD-10-CM | POA: Diagnosis not present

## 2015-07-20 DIAGNOSIS — R319 Hematuria, unspecified: Secondary | ICD-10-CM | POA: Diagnosis not present

## 2015-07-20 DIAGNOSIS — Z803 Family history of malignant neoplasm of breast: Secondary | ICD-10-CM | POA: Diagnosis not present

## 2015-07-20 LAB — CBC WITH DIFFERENTIAL/PLATELET
BASOS ABS: 0 10*3/uL (ref 0.0–0.1)
Basophils Relative: 0.4 % (ref 0.0–3.0)
Eosinophils Absolute: 0.1 10*3/uL (ref 0.0–0.7)
Eosinophils Relative: 1.5 % (ref 0.0–5.0)
HEMATOCRIT: 40 % (ref 36.0–46.0)
HEMOGLOBIN: 13.4 g/dL (ref 12.0–15.0)
LYMPHS PCT: 29.7 % (ref 12.0–46.0)
Lymphs Abs: 2.1 10*3/uL (ref 0.7–4.0)
MCHC: 33.6 g/dL (ref 30.0–36.0)
MCV: 89.4 fl (ref 78.0–100.0)
MONOS PCT: 9.2 % (ref 3.0–12.0)
Monocytes Absolute: 0.6 10*3/uL (ref 0.1–1.0)
NEUTROS ABS: 4.1 10*3/uL (ref 1.4–7.7)
Neutrophils Relative %: 59.2 % (ref 43.0–77.0)
Platelets: 254 10*3/uL (ref 150.0–400.0)
RBC: 4.48 Mil/uL (ref 3.87–5.11)
RDW: 13.6 % (ref 11.5–15.5)
WBC: 6.9 10*3/uL (ref 4.0–10.5)

## 2015-07-20 LAB — POC URINALSYSI DIPSTICK (AUTOMATED)
Bilirubin, UA: NEGATIVE
Blood, UA: NEGATIVE
GLUCOSE UA: NEGATIVE
Ketones, UA: NEGATIVE
LEUKOCYTES UA: NEGATIVE
NITRITE UA: NEGATIVE
Protein, UA: NEGATIVE
Spec Grav, UA: 1.015
UROBILINOGEN UA: 0.2
pH, UA: 7

## 2015-07-20 LAB — BASIC METABOLIC PANEL
BUN: 17 mg/dL (ref 6–23)
CHLORIDE: 99 meq/L (ref 96–112)
CO2: 28 meq/L (ref 19–32)
CREATININE: 0.64 mg/dL (ref 0.40–1.20)
Calcium: 9.6 mg/dL (ref 8.4–10.5)
GFR: 95.4 mL/min (ref >60.00)
Glucose, Bld: 101 mg/dL — ABNORMAL HIGH (ref 70–99)
POTASSIUM: 4.2 meq/L (ref 3.5–5.1)
Sodium: 134 mEq/L — ABNORMAL LOW (ref 135–145)

## 2015-07-20 LAB — TSH: TSH: 0.67 u[IU]/mL (ref 0.35–4.50)

## 2015-07-20 LAB — T3, FREE: T3 FREE: 3 pg/mL (ref 2.3–4.2)

## 2015-07-20 LAB — T4, FREE: FREE T4: 1.04 ng/dL (ref 0.60–1.60)

## 2015-07-20 NOTE — Assessment & Plan Note (Signed)
No sign of vaginal laceration, irritation, rectal source of blood. No sign of UTI.  ? If self limited hematuria due to small kidney stone.  If recurs consider further evaluation.  Discussed with pt routine recommendations for pap/ DVE after hysterectomy...ie none needed given structures no longer present.

## 2015-07-20 NOTE — Assessment & Plan Note (Signed)
Encouraged pt to increase water intake, start simple stretching, will check labs for thyroid, electrolyte or anemia issues.

## 2015-07-20 NOTE — Assessment & Plan Note (Signed)
Discussed with pt breast exam not indicated after age 80 unless mass palpated.  Given pt concern breast exam was performed and was normal.

## 2015-07-20 NOTE — Patient Instructions (Signed)
Increase water to 8 x 8 oz daily.  Stop at lab on way out.

## 2015-07-20 NOTE — Progress Notes (Signed)
Pre visit review using our clinic review tool, if applicable. No additional management support is needed unless otherwise documented below in the visit note. 

## 2015-07-20 NOTE — Progress Notes (Signed)
   Subjective:    Patient ID: Sheryl Miller, female    DOB: April 02, 1935, 80 y.o.   MRN: [id number]  HPI   80 year old female pt OV with multiple symptoms.  1.Hematuria and decreased urine output x 2 days. Saw small amount of blood on toilet tissue x 1 time, 1 day ago, none since No burning with urination. Increase frequency and urgency. No kindery stone. No abd pain. No recent urinary procedure. No history of frequent UTI. No recent UTI.  2. Breast concerns, no specific issue or mass. No nipple discharge.  She just wants breast exam done today to be sure. Has family history for breast cancer, in daughter and  2 sisters. Last mammogramt exam 03/12/15  3. Nightly leg cramps, severe and frequent ongoing x 6 days, off and on in last month. Bilateral.  Better with stretching, standing.  Does not get enough water. Last potassium 3.9 in 03/05/2015  On simvastatin for years.   Social History /Family History/Past Medical History reviewed and updated if needed. Hx of hysterectomy  Review of Systems  Constitutional: Negative for fever and fatigue.  HENT: Negative for ear pain.   Eyes: Negative for pain.  Respiratory: Negative for chest tightness and shortness of breath.   Cardiovascular: Negative for chest pain, palpitations and leg swelling.  Gastrointestinal: Negative for abdominal pain.  Genitourinary: Negative for dysuria.       Objective:   Physical Exam  Constitutional: Vital signs are normal. She appears well-developed and well-nourished. She is cooperative.  Non-toxic appearance. She does not appear ill. No distress.  HENT:  Head: Normocephalic.  Right Ear: Hearing, tympanic membrane, external ear and ear canal normal. Tympanic membrane is not erythematous, not retracted and not bulging.  Left Ear: Hearing, tympanic membrane, external ear and ear canal normal. Tympanic membrane is not erythematous, not retracted and not bulging.  Nose: No mucosal edema or rhinorrhea. Right  sinus exhibits no maxillary sinus tenderness and no frontal sinus tenderness. Left sinus exhibits no maxillary sinus tenderness and no frontal sinus tenderness.  Mouth/Throat: Uvula is midline, oropharynx is clear and moist and mucous membranes are normal.  Eyes: Conjunctivae, EOM and lids are normal. Pupils are equal, round, and reactive to light. Lids are everted and swept, no foreign bodies found.  Neck: Trachea normal and normal range of motion. Neck supple. Carotid bruit is not present. No thyroid mass and no thyromegaly present.  Cardiovascular: Normal rate, regular rhythm, S1 normal, S2 normal, normal heart sounds, intact distal pulses and normal pulses.  Exam reveals no gallop and no friction rub.   No murmur heard. Pulmonary/Chest: Effort normal and breath sounds normal. No tachypnea. No respiratory distress. She has no decreased breath sounds. She has no wheezes. She has no rhonchi. She has no rales.  Abdominal: Soft. Normal appearance and bowel sounds are normal. There is no tenderness.  Genitourinary: Rectum normal. Rectal exam shows no external hemorrhoid and no fissure. No breast swelling, tenderness, discharge or bleeding. No erythema, tenderness or bleeding in the vagina. No foreign body around the vagina. No signs of injury around the vagina. No vaginal discharge found.  Neurological: She is alert.  Skin: Skin is warm, dry and intact. No rash noted.  Psychiatric: Her speech is normal and behavior is normal. Judgment and thought content normal. Her mood appears not anxious. Cognition and memory are normal. She does not exhibit a depressed mood.          Assessment & Plan:

## 2015-08-08 ENCOUNTER — Ambulatory Visit: Payer: Self-pay | Admitting: Podiatry

## 2015-08-13 ENCOUNTER — Ambulatory Visit (INDEPENDENT_AMBULATORY_CARE_PROVIDER_SITE_OTHER): Payer: Commercial Managed Care - HMO | Admitting: Podiatry

## 2015-08-13 ENCOUNTER — Encounter: Payer: Self-pay | Admitting: Podiatry

## 2015-08-13 DIAGNOSIS — M722 Plantar fascial fibromatosis: Secondary | ICD-10-CM | POA: Diagnosis not present

## 2015-08-13 NOTE — Progress Notes (Signed)
She presents today for follow-up of plantar fasciitis to the right heel. She states it seems to be improving. But I am unable to wear the night splint.  Objective: Vital signs are stable she is alert and oriented 3. Pulses are palpable. She has pain on palpation medial calcaneal tubercle of the right heel.  Assessment: Plantar fasciitis right.  Plan: I reinjected the right heel today with Kenalog and local anesthetic. Follow-up with me in 1 month if necessary.

## 2015-08-20 DIAGNOSIS — H401132 Primary open-angle glaucoma, bilateral, moderate stage: Secondary | ICD-10-CM | POA: Diagnosis not present

## 2015-09-19 ENCOUNTER — Ambulatory Visit (INDEPENDENT_AMBULATORY_CARE_PROVIDER_SITE_OTHER): Payer: Commercial Managed Care - HMO | Admitting: Internal Medicine

## 2015-09-19 ENCOUNTER — Encounter: Payer: Self-pay | Admitting: Internal Medicine

## 2015-09-19 VITALS — BP 96/60 | HR 73 | Temp 98.1°F | Resp 16 | Wt 152.5 lb

## 2015-09-19 DIAGNOSIS — J029 Acute pharyngitis, unspecified: Secondary | ICD-10-CM | POA: Insufficient documentation

## 2015-09-19 MED ORDER — PENICILLIN V POTASSIUM 500 MG PO TABS: 1000.0000 mg | ORAL_TABLET | Freq: Two times a day (BID) | ORAL | Status: DC

## 2015-09-19 NOTE — Progress Notes (Signed)
Pre visit review using our clinic review tool, if applicable. No additional management support is needed unless otherwise documented below in the visit note. 

## 2015-09-19 NOTE — Assessment & Plan Note (Signed)
Symptoms not consistent with strep--laryngitis, cough, etc But palatal petechiae and the rash History of rheumatic fever Rapid strep negative Will treat presumptively with PCN though Supportive Rx

## 2015-09-19 NOTE — Progress Notes (Signed)
Subjective:    Patient ID: Sheryl Miller, female    DOB: April 22, 1935, 80 y.o.   MRN: [id number]  HPI Here due to respiratory illness Just came back from [place]--- while there, brother died  Now have granddaughter for 2 months  Developed laryngitis since coming home last week Sore throat and some cough Developed rash on abdomen this morning No fever No apparent swollen glands Trouble swallowing but able to eat No SOB No ear pain  Tried tylenol--no clear help Honey didn't really help  Current Outpatient Prescriptions on File Prior to Visit  Medication Sig Dispense Refill  . COMBIGAN 0.2-0.5 % ophthalmic solution Place 1 drop into both eyes every 12 (twelve) hours. Reported on 03/05/2015    . Multiple Vitamins-Minerals (CENTRUM SILVER ULTRA WOMENS) TABS Take 1 tablet by mouth daily.     . simvastatin (ZOCOR) 40 MG tablet TAKE 1 TABLET AT BEDTIME 90 tablet 3  . traZODone (DESYREL) 150 MG tablet TAKE 1 TABLET AT BEDTIME AS NEEDED  FOR  SLEEP 90 tablet 3   No current facility-administered medications on file prior to visit.    No Known Allergies  Past Medical History  Diagnosis Date  . Raynaud's syndrome   . Osteoporosis, unspecified   . Diverticulosis of colon (without mention of hemorrhage)   . Female stress incontinence   . Internal hemorrhoids without mention of complication   . Unspecified constipation   . Esophageal reflux   . Other and unspecified hyperlipidemia   . Allergic rhinitis, cause unspecified   . Rheumatic fever   . Breast cyst     Past Surgical History  Procedure Laterality Date  . Bladder repair    . Tonsillectomy    . Shoulder surgery       In 2006, bilaterally shoulder surgery for rotator cuff.  . Shoulder arthroscopy with rotator cuff repair and subacromial decompression  08/2001    R, Alphonzo Severance  . Tubal ligation    . Knee arthroscopy Right ~5/16  . Breast biopsy Right 2000    fibrocystic disease  . Abdominal hysterectomy       Family History  Problem Relation Age of Onset  . Coronary artery disease Mother   . Heart disease Mother   . Diabetes Mother   . COPD Father   . Breast cancer Sister     2 (age 66?, age 31?)  . Diabetes Brother   . Breast cancer Maternal Aunt     2  . Stroke Paternal Grandmother   . Diabetes Brother   . Breast cancer Daughter     ? at age 93  . Breast cancer Sister     Social History   Social History  . Marital Status: Married    Spouse Name: N/A  . Number of Children: 4  . Years of Education: N/A   Occupational History  . retired Press photographer    Social History Main Topics  . Smoking status: Never Smoker   . Smokeless tobacco: Never Used  . Alcohol Use: No  . Drug Use: No  . Sexual Activity: Not on file   Other Topics Concern  . Not on file   Social History Narrative   Regular exercise-yes, walking or rides stationery bike daily      Has living will.    Husband should make health care decisions as needed, then son Herbie [place].    Would accept resuscitation but no prolonged ventilation.    Would not want feeding tube if not cognitively  aware               Review of Systems No ill exposures No vomiting or diarrhea Had scarlet fever as child    Objective:   Physical Exam  Constitutional: She appears well-developed and well-nourished. No distress.  HENT:  No sinus tenderness TMs normal Mild pharyngeal injection but some palatal petechiae Mild nasal inflammation  Neck: Normal range of motion. Neck supple. No thyromegaly present.  Pulmonary/Chest: Effort normal and breath sounds normal. No respiratory distress. She has no wheezes. She has no rales.  Lymphadenopathy:    She has no cervical adenopathy.  Skin:  Diffuse confluent red macular rash on abdomen          Assessment & Plan:

## 2015-09-25 ENCOUNTER — Encounter: Payer: Self-pay | Admitting: Internal Medicine

## 2015-09-27 ENCOUNTER — Encounter: Payer: Self-pay | Admitting: Internal Medicine

## 2015-09-27 NOTE — Telephone Encounter (Signed)
Okay to call in lorazepam 0.5mg   #2 x 0 Take 1 an hour before the test and 1 perhaps 15 minutes before Should make it easier

## 2015-10-03 ENCOUNTER — Ambulatory Visit (INDEPENDENT_AMBULATORY_CARE_PROVIDER_SITE_OTHER): Payer: Commercial Managed Care - HMO | Admitting: Podiatry

## 2015-10-03 ENCOUNTER — Encounter: Payer: Self-pay | Admitting: Podiatry

## 2015-10-03 ENCOUNTER — Ambulatory Visit: Payer: Self-pay

## 2015-10-03 VITALS — BP 122/69 | HR 72 | Resp 12

## 2015-10-03 DIAGNOSIS — M629 Disorder of muscle, unspecified: Secondary | ICD-10-CM | POA: Diagnosis not present

## 2015-10-03 NOTE — Progress Notes (Signed)
She presents today walking with an antalgic gait to the right side. States that as she was stretching Saturday morning she felt and heard something pop in her right foot. She states that she had excruciating pain to the right foot with minimal swelling after that point.  Objective: Vital signs are stable she is alert and oriented 3 she has pain on palpation with some ecchymosis on her aspect of the medial longitudinal arch extending to the medial calcaneal tubercle. Radiographs do not demonstrate any type of fracture.  Assessment: Rupture of the plantar fascia right heel.  Plan: Offered her a cam walker but she declined based on the synthetic inside of the shoe and boot. I expressed to her that this was going take many months for it to resolve and that it would be sore for quite some time showed extensive that is amenable to it.

## 2015-10-15 ENCOUNTER — Ambulatory Visit: Payer: Commercial Managed Care - HMO | Admitting: Podiatry

## 2015-10-22 DIAGNOSIS — L820 Inflamed seborrheic keratosis: Secondary | ICD-10-CM | POA: Diagnosis not present

## 2015-10-22 DIAGNOSIS — L309 Dermatitis, unspecified: Secondary | ICD-10-CM | POA: Diagnosis not present

## 2015-10-22 DIAGNOSIS — Z1283 Encounter for screening for malignant neoplasm of skin: Secondary | ICD-10-CM | POA: Diagnosis not present

## 2015-10-22 DIAGNOSIS — L821 Other seborrheic keratosis: Secondary | ICD-10-CM | POA: Diagnosis not present

## 2015-10-22 DIAGNOSIS — L603 Nail dystrophy: Secondary | ICD-10-CM | POA: Diagnosis not present

## 2015-11-07 ENCOUNTER — Telehealth: Payer: Self-pay | Admitting: *Deleted

## 2015-11-07 ENCOUNTER — Encounter: Payer: Self-pay | Admitting: Podiatry

## 2015-11-07 ENCOUNTER — Ambulatory Visit (INDEPENDENT_AMBULATORY_CARE_PROVIDER_SITE_OTHER): Payer: Commercial Managed Care - HMO

## 2015-11-07 ENCOUNTER — Ambulatory Visit (INDEPENDENT_AMBULATORY_CARE_PROVIDER_SITE_OTHER): Payer: Commercial Managed Care - HMO | Admitting: Podiatry

## 2015-11-07 DIAGNOSIS — M19071 Primary osteoarthritis, right ankle and foot: Secondary | ICD-10-CM | POA: Diagnosis not present

## 2015-11-07 DIAGNOSIS — R609 Edema, unspecified: Secondary | ICD-10-CM

## 2015-11-07 DIAGNOSIS — R52 Pain, unspecified: Secondary | ICD-10-CM

## 2015-11-07 DIAGNOSIS — M629 Disorder of muscle, unspecified: Secondary | ICD-10-CM | POA: Diagnosis not present

## 2015-11-07 DIAGNOSIS — O2230 Deep phlebothrombosis in pregnancy, unspecified trimester: Secondary | ICD-10-CM

## 2015-11-07 MED ORDER — DICLOFENAC SODIUM 1 % TD GEL: 4.0000 g | Freq: Four times a day (QID) | TRANSDERMAL | 4 refills | Status: DC

## 2015-11-07 NOTE — Telephone Encounter (Addendum)
-----   Message from Rip Harbour, [facility] sent at 11/07/2015 10:46 AM EDT ----- Regarding: Vascular Patient needs to be set up for a venous doppler as soon as possible. Leg swelling and pain, R/O DVT right. Thanks! Faxed orders to [facility] as STAT order.

## 2015-11-07 NOTE — Progress Notes (Signed)
She presents today for follow-up of her torn plantar fascia. She states that the top and outside of the foot is starting her. She's also complaining of swelling in her legs right greater than left pain. This states she cannot walk for long period of time. She does not wear compression hose.  Objective: Pitting edema right lower extremity over the left with some hyperpigmentation along the medial aspect of the ankle which is painful palpation. She also has pain on palpation of the plantar fascia. She has tenderness on palpation lateral aspect of the right foot. Radiographs taken today do not demonstrate any type of fractures or osseous abnormalities. She still has considerable amount of tissue edema near the plantar fascia rupture.  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 Venous insufficiency cannot rule out a DVT because of the swelling and the heat in the leg. Chronic pain due to the plantar fascia rupture.  Plan: Requested venous studies today to rule out DVT and reflux. I also wrote a prescription for diclofenac gel to be applied to the right foot.

## 2015-11-08 DIAGNOSIS — M7989 Other specified soft tissue disorders: Secondary | ICD-10-CM | POA: Diagnosis not present

## 2015-11-08 DIAGNOSIS — M79609 Pain in unspecified limb: Secondary | ICD-10-CM | POA: Diagnosis not present

## 2015-11-26 DIAGNOSIS — L300 Nummular dermatitis: Secondary | ICD-10-CM | POA: Diagnosis not present

## 2015-11-26 DIAGNOSIS — L820 Inflamed seborrheic keratosis: Secondary | ICD-10-CM | POA: Diagnosis not present

## 2015-11-26 DIAGNOSIS — L821 Other seborrheic keratosis: Secondary | ICD-10-CM | POA: Diagnosis not present

## 2015-12-13 ENCOUNTER — Ambulatory Visit (INDEPENDENT_AMBULATORY_CARE_PROVIDER_SITE_OTHER): Payer: Commercial Managed Care - HMO

## 2015-12-13 DIAGNOSIS — Z23 Encounter for immunization: Secondary | ICD-10-CM | POA: Diagnosis not present

## 2015-12-19 ENCOUNTER — Ambulatory Visit (INDEPENDENT_AMBULATORY_CARE_PROVIDER_SITE_OTHER): Payer: Commercial Managed Care - HMO | Admitting: Podiatry

## 2015-12-19 ENCOUNTER — Encounter: Payer: Self-pay | Admitting: Podiatry

## 2015-12-19 DIAGNOSIS — M629 Disorder of muscle, unspecified: Secondary | ICD-10-CM | POA: Diagnosis not present

## 2015-12-19 NOTE — Progress Notes (Signed)
She presents today for follow-up of pain to the posterior aspect of the rightleg and calf swelling.She states that the plantar aspect of the right heel hurts as well.  Objective: Vital signs are stable she is alert and 3. Vascular evaluation and ultrasound was negative for DVT and phlebitis. She has a palpable nodule to the aponeurotic area of the gastrocsoleus complex and the Achilles tendon she also has a tear in the plantar fascia of the right heel.  Tear of the plantar fascia and probable tear of the right gastroc distally.  Plan: Refer to physical therapy.

## 2015-12-27 DIAGNOSIS — M25571 Pain in right ankle and joints of right foot: Secondary | ICD-10-CM | POA: Diagnosis not present

## 2016-01-01 DIAGNOSIS — M25571 Pain in right ankle and joints of right foot: Secondary | ICD-10-CM | POA: Diagnosis not present

## 2016-01-03 DIAGNOSIS — M25571 Pain in right ankle and joints of right foot: Secondary | ICD-10-CM | POA: Diagnosis not present

## 2016-01-07 DIAGNOSIS — M25571 Pain in right ankle and joints of right foot: Secondary | ICD-10-CM | POA: Diagnosis not present

## 2016-01-10 DIAGNOSIS — M25571 Pain in right ankle and joints of right foot: Secondary | ICD-10-CM | POA: Diagnosis not present

## 2016-01-16 DIAGNOSIS — M25571 Pain in right ankle and joints of right foot: Secondary | ICD-10-CM | POA: Diagnosis not present

## 2016-01-18 DIAGNOSIS — M25571 Pain in right ankle and joints of right foot: Secondary | ICD-10-CM | POA: Diagnosis not present

## 2016-01-23 DIAGNOSIS — M25571 Pain in right ankle and joints of right foot: Secondary | ICD-10-CM | POA: Diagnosis not present

## 2016-01-25 DIAGNOSIS — M25571 Pain in right ankle and joints of right foot: Secondary | ICD-10-CM | POA: Diagnosis not present

## 2016-02-04 DIAGNOSIS — M25571 Pain in right ankle and joints of right foot: Secondary | ICD-10-CM | POA: Diagnosis not present

## 2016-02-06 DIAGNOSIS — M25571 Pain in right ankle and joints of right foot: Secondary | ICD-10-CM | POA: Diagnosis not present

## 2016-02-08 ENCOUNTER — Encounter: Payer: Self-pay | Admitting: Podiatry

## 2016-02-08 ENCOUNTER — Encounter: Payer: Self-pay | Admitting: Internal Medicine

## 2016-02-13 ENCOUNTER — Encounter: Payer: Self-pay | Admitting: Internal Medicine

## 2016-02-13 ENCOUNTER — Ambulatory Visit (INDEPENDENT_AMBULATORY_CARE_PROVIDER_SITE_OTHER): Payer: Commercial Managed Care - HMO | Admitting: Internal Medicine

## 2016-02-13 VITALS — BP 100/60 | HR 59 | Temp 97.3°F | Ht 62.0 in | Wt 154.0 lb

## 2016-02-13 DIAGNOSIS — G479 Sleep disorder, unspecified: Secondary | ICD-10-CM

## 2016-02-13 DIAGNOSIS — Z0000 Encounter for general adult medical examination without abnormal findings: Secondary | ICD-10-CM | POA: Diagnosis not present

## 2016-02-13 DIAGNOSIS — E785 Hyperlipidemia, unspecified: Secondary | ICD-10-CM

## 2016-02-13 DIAGNOSIS — Z7189 Other specified counseling: Secondary | ICD-10-CM

## 2016-02-13 DIAGNOSIS — M542 Cervicalgia: Secondary | ICD-10-CM | POA: Insufficient documentation

## 2016-02-13 NOTE — Assessment & Plan Note (Signed)
Neck now a problem Will continue the trazodone

## 2016-02-13 NOTE — Progress Notes (Signed)
Subjective:    Patient ID: Sheryl Miller, female    DOB: [birth year], 79 y.o.   MRN: [id number]  HPI  Here for Medicare wellness and follow up of chronic health conditions Reviewed form and advanced directives Reviewed other doctors Weekly drink of alcohol No tobacco Exercises sporadically Vision and hearing are okay No falls No depression or anhedonia Independent with instrumental ADLs No apparent memory problems  "My neck is killing me" Has tried heating pad--minimally helpful--and burned from too hot Tylenol some help Left neck is the worst--but now going down back on both sides (points to traps) and now to both upper arms No known injury Worst when getting up at first-- then will improve over time (with tylenol)  Ongoing issues with foot Seeing PT due to this  Still on statin No myalgias or GI problems  Continues on trazodone for sleep Now taking full 150mg   Still has night awakening at times or early awakening  Current Outpatient Prescriptions on File Prior to Visit  Medication Sig Dispense Refill  . Ascorbic Acid (VITAMIN C) 100 MG tablet Take 100 mg by mouth daily.    . COMBIGAN 0.2-0.5 % ophthalmic solution Place 1 drop into both eyes every 12 (twelve) hours. Reported on 03/05/2015    . Multiple Vitamins-Minerals (CENTRUM SILVER ULTRA WOMENS) TABS Take 1 tablet by mouth daily.     . simvastatin (ZOCOR) 40 MG tablet TAKE 1 TABLET AT BEDTIME 90 tablet 3  . traZODone (DESYREL) 150 MG tablet TAKE 1 TABLET AT BEDTIME AS NEEDED  FOR  SLEEP 90 tablet 3   No current facility-administered medications on file prior to visit.     No Known Allergies  Past Medical History:  Diagnosis Date  . Allergic rhinitis, cause unspecified   . Breast cyst   . Diverticulosis of colon (without mention of hemorrhage)   . Esophageal reflux   . Female stress incontinence   . Internal hemorrhoids without mention of complication   . Osteoporosis, unspecified   . Other and  unspecified hyperlipidemia   . Raynaud's syndrome   . Rheumatic fever   . Unspecified constipation     Past Surgical History:  Procedure Laterality Date  . ABDOMINAL HYSTERECTOMY    . BLADDER REPAIR    . BREAST BIOPSY Right 2000   fibrocystic disease  . KNEE ARTHROSCOPY Right ~5/16  . SHOULDER ARTHROSCOPY WITH ROTATOR CUFF REPAIR AND SUBACROMIAL DECOMPRESSION  08/2001   R, Alphonzo Severance  . SHOULDER SURGERY      In 2006, bilaterally shoulder surgery for rotator cuff.  . TONSILLECTOMY    . TUBAL LIGATION      Family History  Problem Relation Age of Onset  . Coronary artery disease Mother   . Heart disease Mother   . Diabetes Mother   . COPD Father   . Breast cancer Sister     2 (age 48?, age 109?)  . Diabetes Brother   . Breast cancer Maternal Aunt     2  . Stroke Paternal Grandmother   . Diabetes Brother   . Breast cancer Sister   . Breast cancer Daughter     ? at age 15    Social History   Social History  . Marital status: Married    Spouse name: N/A  . Number of children: 4  . Years of education: N/A   Occupational History  . retired Press photographer Retired   Social History Main Topics  . Smoking status: Never Smoker  .  Smokeless tobacco: Never Used  . Alcohol use No  . Drug use: No  . Sexual activity: Not on file   Other Topics Concern  . Not on file   Social History Narrative   Regular exercise-yes, walking or rides stationery bike daily      Has living will.    Husband should make health care decisions as needed, then son Herbie [place].    Would accept resuscitation but no prolonged ventilation.    Would not want feeding tube for extended time if not cognitively aware               Review of Systems No regular exercise due to foot and knee Appetite is fine Weight stable Wears seat belt Teeth are okay Has some itchy ears-- has tried different things which helps some No chest pain or SOB No dizziness or syncope No edema Bowels are slow--has to push at  times. No meds for this-- has miralax Voids okay--may have occasional scant incontinence (wears thin pad going out sometimes) No suspicious lesions--derm once a year     Objective:   Physical Exam  Constitutional: She is oriented to person, place, and time.  HENT:  Mouth/Throat: Oropharynx is clear and moist. No oropharyngeal exudate.  Full upper, partial lower  Neck:  Tenderness over traps Very limited flexion and tilt. Somewhat decreased extension  Cardiovascular: Normal rate, regular rhythm, normal heart sounds and intact distal pulses.  Exam reveals no gallop.   No murmur heard. Pulmonary/Chest: Effort normal and breath sounds normal. No respiratory distress. She has no wheezes. She has no rales.  Abdominal: Soft. There is no tenderness.  Musculoskeletal: She exhibits no edema or tenderness.  Neurological: She is alert and oriented to person, place, and time.  President-- "Trump, Obama, ?" 100-95?? [id number] Recall 3/3  Skin: No rash noted. No erythema.  Psychiatric: She has a normal mood and affect. Her behavior is normal.          Assessment & Plan:

## 2016-02-13 NOTE — Assessment & Plan Note (Signed)
She still wants to continue primary prevention

## 2016-02-13 NOTE — Assessment & Plan Note (Signed)
Seems mostly muscular CT recently only shows mild arthritis Will set up with PT--it is causing her sleep loss

## 2016-02-13 NOTE — Assessment & Plan Note (Signed)
See social history Blank forms given--not sure she has official POA

## 2016-02-13 NOTE — Progress Notes (Signed)
Pre visit review using our clinic review tool, if applicable. No additional management support is needed unless otherwise documented below in the visit note. 

## 2016-02-13 NOTE — Assessment & Plan Note (Signed)
I have personally reviewed the Medicare Annual Wellness questionnaire and have noted 1. The patient's medical and social history 2. Their use of alcohol, tobacco or illicit drugs 3. Their current medications and supplements 4. The patient's functional ability including ADL's, fall risks, home safety risks and hearing or visual             impairment. 5. Diet and physical activities 6. Evidence for depression or mood disorders  The patients weight, height, BMI and visual acuity have been recorded in the chart I have made referrals, counseling and provided education to the patient based review of the above and I have provided the pt with a written personalized care plan for preventive services.  I have provided you with a copy of your personalized plan for preventive services. Please take the time to review along with your updated medication list.  UTD on imms Colon 5 years ago--only diverticuli One more mammogram Discussed trying to be more consistent with exercise

## 2016-02-14 DIAGNOSIS — M542 Cervicalgia: Secondary | ICD-10-CM | POA: Diagnosis not present

## 2016-02-15 DIAGNOSIS — M542 Cervicalgia: Secondary | ICD-10-CM | POA: Diagnosis not present

## 2016-02-19 DIAGNOSIS — M542 Cervicalgia: Secondary | ICD-10-CM | POA: Diagnosis not present

## 2016-02-21 DIAGNOSIS — H401132 Primary open-angle glaucoma, bilateral, moderate stage: Secondary | ICD-10-CM | POA: Diagnosis not present

## 2016-02-22 DIAGNOSIS — M542 Cervicalgia: Secondary | ICD-10-CM | POA: Diagnosis not present

## 2016-02-27 ENCOUNTER — Other Ambulatory Visit: Payer: Self-pay | Admitting: Internal Medicine

## 2016-02-28 DIAGNOSIS — M542 Cervicalgia: Secondary | ICD-10-CM | POA: Diagnosis not present

## 2016-03-04 DIAGNOSIS — M542 Cervicalgia: Secondary | ICD-10-CM | POA: Diagnosis not present

## 2016-03-07 DIAGNOSIS — M542 Cervicalgia: Secondary | ICD-10-CM | POA: Diagnosis not present

## 2016-03-14 DIAGNOSIS — H401132 Primary open-angle glaucoma, bilateral, moderate stage: Secondary | ICD-10-CM | POA: Diagnosis not present

## 2016-03-29 ENCOUNTER — Other Ambulatory Visit: Payer: Self-pay | Admitting: Internal Medicine

## 2016-06-05 ENCOUNTER — Encounter: Payer: Self-pay | Admitting: Internal Medicine

## 2016-06-17 ENCOUNTER — Other Ambulatory Visit: Payer: Self-pay | Admitting: Internal Medicine

## 2016-06-17 DIAGNOSIS — Z1231 Encounter for screening mammogram for malignant neoplasm of breast: Secondary | ICD-10-CM

## 2016-07-08 ENCOUNTER — Ambulatory Visit
Admission: RE | Admit: 2016-07-08 | Discharge: 2016-07-08 | Disposition: A | Discharge status: Home / Self Care | Payer: Commercial Managed Care - HMO | Source: Ambulatory Visit | Attending: Internal Medicine | Admitting: Internal Medicine

## 2016-07-08 DIAGNOSIS — Z1231 Encounter for screening mammogram for malignant neoplasm of breast: Secondary | ICD-10-CM | POA: Diagnosis not present

## 2016-07-09 ENCOUNTER — Other Ambulatory Visit: Payer: Self-pay | Admitting: Internal Medicine

## 2016-07-09 DIAGNOSIS — N6310 Unspecified lump in the right breast, unspecified quadrant: Secondary | ICD-10-CM

## 2016-07-09 DIAGNOSIS — R928 Other abnormal and inconclusive findings on diagnostic imaging of breast: Secondary | ICD-10-CM

## 2016-07-14 ENCOUNTER — Telehealth: Payer: Self-pay | Admitting: Internal Medicine

## 2016-07-14 NOTE — Telephone Encounter (Signed)
Patient said she hasn't heard from [facility] about follow up from mammogram,  Please call patient.

## 2016-07-14 NOTE — Telephone Encounter (Signed)
Thank you. I will give them a call here shortly.

## 2016-07-14 NOTE — Telephone Encounter (Signed)
See imaging note

## 2016-07-19 DIAGNOSIS — J209 Acute bronchitis, unspecified: Secondary | ICD-10-CM | POA: Diagnosis not present

## 2016-07-19 DIAGNOSIS — R05 Cough: Secondary | ICD-10-CM | POA: Diagnosis not present

## 2016-07-21 ENCOUNTER — Ambulatory Visit
Admission: RE | Admit: 2016-07-21 | Discharge: 2016-07-21 | Disposition: A | Discharge status: Home / Self Care | Payer: Commercial Managed Care - HMO | Source: Ambulatory Visit | Attending: Internal Medicine | Admitting: Internal Medicine

## 2016-07-21 DIAGNOSIS — N6310 Unspecified lump in the right breast, unspecified quadrant: Secondary | ICD-10-CM

## 2016-07-21 DIAGNOSIS — N6001 Solitary cyst of right breast: Secondary | ICD-10-CM | POA: Insufficient documentation

## 2016-07-21 DIAGNOSIS — R928 Other abnormal and inconclusive findings on diagnostic imaging of breast: Secondary | ICD-10-CM

## 2016-07-22 DIAGNOSIS — R05 Cough: Secondary | ICD-10-CM | POA: Diagnosis not present

## 2016-07-22 DIAGNOSIS — J209 Acute bronchitis, unspecified: Secondary | ICD-10-CM | POA: Diagnosis not present

## 2016-07-22 DIAGNOSIS — J180 Bronchopneumonia, unspecified organism: Secondary | ICD-10-CM | POA: Diagnosis not present

## 2016-07-24 ENCOUNTER — Ambulatory Visit: Payer: Commercial Managed Care - HMO | Admitting: Family Medicine

## 2016-07-27 ENCOUNTER — Emergency Department: Payer: Commercial Managed Care - HMO

## 2016-07-27 ENCOUNTER — Encounter: Payer: Self-pay | Admitting: Emergency Medicine

## 2016-07-27 ENCOUNTER — Emergency Department
Admission: EM | Admit: 2016-07-27 | Discharge: 2016-07-27 | Disposition: A | Discharge status: Home / Self Care | Payer: Commercial Managed Care - HMO | Attending: Emergency Medicine | Admitting: Emergency Medicine

## 2016-07-27 DIAGNOSIS — R062 Wheezing: Secondary | ICD-10-CM | POA: Diagnosis not present

## 2016-07-27 DIAGNOSIS — Z79899 Other long term (current) drug therapy: Secondary | ICD-10-CM | POA: Diagnosis not present

## 2016-07-27 DIAGNOSIS — T7849XA Other allergy, initial encounter: Secondary | ICD-10-CM | POA: Diagnosis not present

## 2016-07-27 DIAGNOSIS — R05 Cough: Secondary | ICD-10-CM | POA: Diagnosis not present

## 2016-07-27 DIAGNOSIS — Z9109 Other allergy status, other than to drugs and biological substances: Secondary | ICD-10-CM

## 2016-07-27 DIAGNOSIS — J302 Other seasonal allergic rhinitis: Secondary | ICD-10-CM | POA: Diagnosis not present

## 2016-07-27 MED ORDER — ALBUTEROL SULFATE (2.5 MG/3ML) 0.083% IN NEBU: 2.5000 mg | INHALATION_SOLUTION | Freq: Four times a day (QID) | RESPIRATORY_TRACT | 12 refills | Status: DC | PRN

## 2016-07-27 NOTE — ED Notes (Signed)
See triage note  States she developed cough and congestion about 2-3 weeks ago states her cough is non prod  No fever   Was placed on z-pak and inhalers  And now currently taking doxy  But states sx's are not any better  resp even and non labored at present

## 2016-07-27 NOTE — ED Notes (Signed)
Returned from XR 

## 2016-07-27 NOTE — ED Provider Notes (Signed)
[facility] Provider Note   CSN: [id number] Arrival date & time: 07/27/16  1156     History   Chief Complaint Chief Complaint  Patient presents with  . Cough    HPI Sheryl Miller is a 81 y.o. female presents to the emergency department with her husband with similar symptoms of wheezing at night, cough, chest tightness. Symptoms been present for 3 weeks. Cough is dry and nonproductive. She is been treated with azithromycin, doxycycline as well as 2 rounds of prednisone with minimal improvement. She is currently doing albuterol nebulizers missing some improvement every 6 hours. She denies any history of COPD or asthma. Her husband has similar symptoms. She believes that symptoms are present from new carpet that was installed 3 weeks ago around the same time of symptom onset. Patient's symptoms are slightly improved after leaving home. She denies any fevers, sneezing, watery eyes, chest pain, shortness of breath, abdominal pain, nausea or vomiting.  HPI  Past Medical History:  Diagnosis Date  . Allergic rhinitis, cause unspecified   . Breast cyst   . Diverticulosis of colon (without mention of hemorrhage)   . Esophageal reflux   . Female stress incontinence   . Internal hemorrhoids without mention of complication   . Osteoporosis, unspecified   . Other and unspecified hyperlipidemia   . Raynaud's syndrome   . Rheumatic fever   . Unspecified constipation     Patient Active Problem List   Diagnosis Date Noted  . Neck pain 02/13/2016  . Advance directive discussed with patient 02/13/2016  . Nocturnal leg cramps 07/20/2015  . Family history of breast cancer in first degree relative 07/20/2015  . Hyperlipemia 02/12/2015  . Sleep disturbance 04/20/2014  . Routine general medical examination at a health care facility 08/16/2010  . PES PLANUS 02/11/2010  . UNEQUAL LEG LENGTH 02/11/2010  . VARICOSE VEINS, LOWER EXTREMITIES 12/01/2007  . DIVERTICULOSIS, COLON  06/24/2007  . Osteoporosis 06/24/2007  . URINARY INCONTINENCE, STRESS, FEMALE 06/17/2007  . HEMORRHOIDS, INTERNAL 06/07/2007  . Chronic constipation 06/07/2007  . GERD 02/11/2007  . Allergic rhinitis due to pollen 07/09/2006    Past Surgical History:  Procedure Laterality Date  . ABDOMINAL HYSTERECTOMY    . BLADDER REPAIR    . BREAST BIOPSY Right 2000   fibrocystic disease  . KNEE ARTHROSCOPY Right ~5/16  . SHOULDER ARTHROSCOPY WITH ROTATOR CUFF REPAIR AND SUBACROMIAL DECOMPRESSION  08/2001   R, Alphonzo Severance  . SHOULDER SURGERY      In 2006, bilaterally shoulder surgery for rotator cuff.  . TONSILLECTOMY    . TUBAL LIGATION      OB History    Gravida Para Term Preterm AB Living   4 4       4    SAB TAB Ectopic Multiple Live Births                  Obstetric Comments   1st Menstrual Cycle: 14 1st Pregnancy: 23       Home Medications    Prior to Admission medications   Medication Sig Start Date End Date Taking? Authorizing Provider  doxycycline (VIBRAMYCIN) 100 MG capsule Take 100 mg by mouth 2 (two) times daily.   Yes [provider]  albuterol (PROVENTIL) (2.5 MG/3ML) 0.083% nebulizer solution Take 3 mLs (2.5 mg total) by nebulization every 6 (six) hours as needed for wheezing or shortness of breath. 07/27/16   Duanne Guess, PA-C  Ascorbic Acid (VITAMIN C) 100 MG tablet Take 100 mg  by mouth daily.    [provider]  COMBIGAN 0.2-0.5 % ophthalmic solution Place 1 drop into both eyes every 12 (twelve) hours. Reported on 03/05/2015 10/29/12   [provider]  Multiple Vitamins-Minerals (CENTRUM SILVER ULTRA WOMENS) TABS Take 1 tablet by mouth daily.     [provider]  simvastatin (ZOCOR) 40 MG tablet TAKE 1 TABLET AT BEDTIME 02/27/16   Viviana Simpler I, MD  traZODone (DESYREL) 150 MG tablet TAKE 1 TABLET AT BEDTIME AS NEEDED  FOR  SLEEP 03/31/16   Venia Carbon, MD    Family History Family History  Problem Relation Age of Onset   . Coronary artery disease Mother   . Heart disease Mother   . Diabetes Mother   . COPD Father   . Breast cancer Sister        2 (age 30?, age 78?)  . Diabetes Brother   . Breast cancer Maternal Aunt        2  . Stroke Paternal Grandmother   . Diabetes Brother   . Breast cancer Sister   . Breast cancer Daughter        ? at age 21    Social History Social History  Substance Use Topics  . Smoking status: Never Smoker  . Smokeless tobacco: Never Used  . Alcohol use No     Allergies   Patient has no known allergies.   Review of Systems Review of Systems  Constitutional: Negative for activity change, chills, fatigue and fever.  HENT: Negative for congestion, rhinorrhea, sinus pressure and sore throat.   Eyes: Negative for visual disturbance.  Respiratory: Positive for cough, chest tightness and wheezing. Negative for shortness of breath.   Cardiovascular: Negative for chest pain and leg swelling.  Gastrointestinal: Negative for abdominal pain, diarrhea, nausea and vomiting.  Genitourinary: Negative for dysuria.  Musculoskeletal: Negative for arthralgias and gait problem.  Skin: Negative for rash.  Neurological: Negative for weakness, numbness and headaches.  Hematological: Negative for adenopathy.  Psychiatric/Behavioral: Negative for agitation, behavioral problems and confusion.     Physical Exam Updated Vital Signs BP 134/65   Pulse (!) 56   Temp 98 F (36.7 C) (Oral)   Resp 18   SpO2 98%   Physical Exam  Constitutional: She is oriented to person, place, and time. She appears well-developed and well-nourished. No distress.  HENT:  Head: Normocephalic and atraumatic.  Mouth/Throat: Oropharynx is clear and moist. No oropharyngeal exudate.  Eyes: EOM are normal. Pupils are equal, round, and reactive to light. Right eye exhibits no discharge. Left eye exhibits no discharge.  Neck: Normal range of motion. Neck supple.  Cardiovascular: Normal rate, regular rhythm  and intact distal pulses.   Pulmonary/Chest: Effort normal and breath sounds normal. No respiratory distress. She has no wheezes. She has no rales. She exhibits no tenderness.  Abdominal: Soft. She exhibits no distension. There is no tenderness.  Musculoskeletal: Normal range of motion. She exhibits no edema.  Lymphadenopathy:    She has no cervical adenopathy.  Neurological: She is alert and oriented to person, place, and time. She has normal reflexes.  Skin: Skin is warm and dry.  Psychiatric: She has a normal mood and affect. Her behavior is normal. Judgment and thought content normal.     ED Treatments / Results  Labs (all labs ordered are listed, but only abnormal results are displayed) Labs Reviewed - No data to display  EKG  EKG Interpretation None  Radiology Dg Chest 2 View  Result Date: 07/27/2016 CLINICAL DATA:  Cough and congestion. EXAM: CHEST  2 VIEW COMPARISON:  March 05, 2015 FINDINGS: No pneumothorax. The heart, hila, and mediastinum are normal. No pulmonary nodules or masses. No focal infiltrates. Minimal scarring in the left base. No cause for acute symptoms is identified. IMPRESSION: No active cardiopulmonary disease. Electronically Signed   By: Dorise Bullion III M.D   On: 07/27/2016 13:39    Procedures Procedures (including critical care time)  Medications Ordered in ED Medications - No data to display   Initial Impression / Assessment and Plan / ED Course  I have reviewed the triage vital signs and the nursing notes.  Pertinent labs & imaging results that were available during my care of the patient were reviewed by me and considered in my medical decision making (see chart for details).     81 year old female with nighttime wheezing, nonproductive cough and intermittent chest tightness that is improved with albuterol. She's been treated with 2 antibiotics and prednisone with no resolution of her symptoms. Her husband has similar symptoms and  both patient's symptoms began after insulation of new carpet. They're concerned they have a carpet allergy. Symptoms seem to be improved from the removed themselves from the new carpet. Recommend patient continue with day time and histamines, albuterol inhaler, increased ventilation of the home, discuss removal of carpet with installer. Patient will follow-up with PCP, she does Symptoms Return to the ED for.  Final Clinical Impressions(s) / ED Diagnoses   Final diagnoses:  Environmental allergies    New Prescriptions Discharge Medication List as of 07/27/2016  3:02 PM    START taking these medications   Details  albuterol (PROVENTIL) (2.5 MG/3ML) 0.083% nebulizer solution Take 3 mLs (2.5 mg total) by nebulization every 6 (six) hours as needed for wheezing or shortness of breath., Starting Sun 07/27/2016, Print         Duanne Guess, PA-C 07/27/16 1533    Nena Polio, MD 07/29/16 1728

## 2016-07-27 NOTE — Discharge Instructions (Signed)
Please take over-the-counter antihistamines and continue with albuterol as needed. Follow-up with PCP in 2-3 days. Return to the ER for any worsening symptoms or urgent changes in her health.

## 2016-07-27 NOTE — ED Triage Notes (Signed)
Pt c/o cough and congestion x 3 weeks. Pt states that she has been seen at urgent care twice and given medication without relief. Thinks it may be related to new rugs that were put in their house.

## 2016-08-11 ENCOUNTER — Ambulatory Visit (INDEPENDENT_AMBULATORY_CARE_PROVIDER_SITE_OTHER): Payer: Commercial Managed Care - HMO | Admitting: Internal Medicine

## 2016-08-11 ENCOUNTER — Encounter: Payer: Self-pay | Admitting: Internal Medicine

## 2016-08-11 VITALS — BP 122/70 | HR 61 | Temp 97.7°F | Wt 155.0 lb

## 2016-08-11 DIAGNOSIS — R05 Cough: Secondary | ICD-10-CM

## 2016-08-11 DIAGNOSIS — R059 Cough, unspecified: Secondary | ICD-10-CM

## 2016-08-11 DIAGNOSIS — G479 Sleep disorder, unspecified: Secondary | ICD-10-CM

## 2016-08-11 DIAGNOSIS — R5383 Other fatigue: Secondary | ICD-10-CM | POA: Diagnosis not present

## 2016-08-11 LAB — COMPREHENSIVE METABOLIC PANEL
ALK PHOS: 59 U/L (ref 39–117)
ALT: 12 U/L (ref 0–35)
AST: 18 U/L (ref 0–37)
Albumin: 3.9 g/dL (ref 3.5–5.2)
BILIRUBIN TOTAL: 0.4 mg/dL (ref 0.2–1.2)
BUN: 17 mg/dL (ref 6–23)
CO2: 31 mEq/L (ref 19–32)
CREATININE: 0.65 mg/dL (ref 0.40–1.20)
Calcium: 9.7 mg/dL (ref 8.4–10.5)
Chloride: 101 mEq/L (ref 96–112)
GFR: 93.45 mL/min (ref >60.00)
GLUCOSE: 87 mg/dL (ref 70–99)
POTASSIUM: 4.7 meq/L (ref 3.5–5.1)
SODIUM: 137 meq/L (ref 135–145)
TOTAL PROTEIN: 6.7 g/dL (ref 6.0–8.3)

## 2016-08-11 LAB — CBC WITH DIFFERENTIAL/PLATELET
Basophils Absolute: 0 10*3/uL (ref 0.0–0.1)
Basophils Relative: 0.8 % (ref 0.0–3.0)
EOS PCT: 3 % (ref 0.0–5.0)
Eosinophils Absolute: 0.1 10*3/uL (ref 0.0–0.7)
HCT: 36.8 % (ref 36.0–46.0)
Hemoglobin: 12.4 g/dL (ref 12.0–15.0)
LYMPHS ABS: 2 10*3/uL (ref 0.7–4.0)
Lymphocytes Relative: 43.6 % (ref 12.0–46.0)
MCHC: 33.7 g/dL (ref 30.0–36.0)
MCV: 88 fl (ref 78.0–100.0)
MONO ABS: 0.6 10*3/uL (ref 0.1–1.0)
Monocytes Relative: 12.2 % — ABNORMAL HIGH (ref 3.0–12.0)
NEUTROS PCT: 40.4 % — AB (ref 43.0–77.0)
Neutro Abs: 1.8 10*3/uL (ref 1.4–7.7)
Platelets: 213 10*3/uL (ref 150.0–400.0)
RBC: 4.18 Mil/uL (ref 3.87–5.11)
RDW: 14.5 % (ref 11.5–15.5)
WBC: 4.6 10*3/uL (ref 4.0–10.5)

## 2016-08-11 LAB — T4, FREE: FREE T4: 0.91 ng/dL (ref 0.60–1.60)

## 2016-08-11 MED ORDER — CLONAZEPAM 0.5 MG PO TABS: 0.2500 mg | ORAL_TABLET | Freq: Every evening | ORAL | 0 refills | Status: DC | PRN

## 2016-08-11 NOTE — Progress Notes (Signed)
Subjective:    Patient ID: Sheryl Miller, female    DOB: Jun 09, 1935, 81 y.o.   MRN: [id number]  HPI Here due to ongoing cough  She and husband have been feeling sick---phlegm, weakness, cough, headache--since new carpeting was put in Goes back about a month It was removed since then about a week ago--now awaiting vinyl to put in  Still not doing well Feels some better Still with phlegm Twice to urgent care--then to ER Rx with prednisone twice (last about 3 weeks ago) and antibiotic Also got nebulizer and cough syrup Has inhaler also That has all helped some but still feels like there is phlegm caught inthroat No fever Not SOB  Current Outpatient Prescriptions on File Prior to Visit  Medication Sig Dispense Refill  . Ascorbic Acid (VITAMIN C) 100 MG tablet Take 100 mg by mouth daily.    . COMBIGAN 0.2-0.5 % ophthalmic solution Place 1 drop into both eyes every 12 (twelve) hours. Reported on 03/05/2015    . Multiple Vitamins-Minerals (CENTRUM SILVER ULTRA WOMENS) TABS Take 1 tablet by mouth daily.     . simvastatin (ZOCOR) 40 MG tablet TAKE 1 TABLET AT BEDTIME 90 tablet 3  . traZODone (DESYREL) 150 MG tablet TAKE 1 TABLET AT BEDTIME AS NEEDED  FOR  SLEEP 90 tablet 3   No current facility-administered medications on file prior to visit.     No Known Allergies  Past Medical History:  Diagnosis Date  . Allergic rhinitis, cause unspecified   . Breast cyst   . Diverticulosis of colon (without mention of hemorrhage)   . Esophageal reflux   . Female stress incontinence   . Internal hemorrhoids without mention of complication   . Osteoporosis, unspecified   . Other and unspecified hyperlipidemia   . Raynaud's syndrome   . Rheumatic fever   . Unspecified constipation     Past Surgical History:  Procedure Laterality Date  . ABDOMINAL HYSTERECTOMY    . BLADDER REPAIR    . BREAST BIOPSY Right 2000   fibrocystic disease  . KNEE ARTHROSCOPY Right ~5/16  . SHOULDER  ARTHROSCOPY WITH ROTATOR CUFF REPAIR AND SUBACROMIAL DECOMPRESSION  08/2001   R, Alphonzo Severance  . SHOULDER SURGERY      In 2006, bilaterally shoulder surgery for rotator cuff.  . TONSILLECTOMY    . TUBAL LIGATION      Family History  Problem Relation Age of Onset  . Coronary artery disease Mother   . Heart disease Mother   . Diabetes Mother   . COPD Father   . Breast cancer Sister        2 (age 30?, age 32?)  . Diabetes Brother   . Breast cancer Maternal Aunt        2  . Stroke Paternal Grandmother   . Diabetes Brother   . Breast cancer Sister   . Breast cancer Daughter        ? at age 78    Social History   Social History  . Marital status: Married    Spouse name: N/A  . Number of children: 4  . Years of education: N/A   Occupational History  . retired Press photographer Retired   Social History Main Topics  . Smoking status: Never Smoker  . Smokeless tobacco: Never Used  . Alcohol use No  . Drug use: No  . Sexual activity: Not on file   Other Topics Concern  . Not on file   Social History Narrative  Regular exercise-yes, walking or rides stationery bike daily      Has living will.    Husband should make health care decisions as needed, then son Sheryl Miller.    Would accept resuscitation but no prolonged ventilation.    Would not want feeding tube for extended time if not cognitively aware               Review of Systems Not sleeping well---goes way back Gets 2-3 hours at most Predates current concerns with cough Trazodone not helping much---initiates but then up after 2-3 hours Crochets and watches TV before bed No extended time in bed before trying to get to sleep No other medications other than melatonin Tried sleepy tyme tea once---not clear if any help No recent leg cramps    Objective:   Physical Exam  Constitutional: She appears well-nourished. No distress.  HENT:  Mouth/Throat: Oropharynx is clear and moist. No oropharyngeal exudate.  Neck: No  thyromegaly present.  Pulmonary/Chest: Effort normal and breath sounds normal. No respiratory distress. She has no wheezes. She has no rales.  Lymphadenopathy:    She has no cervical adenopathy.  Psychiatric: She has a normal mood and affect. Her behavior is normal.          Assessment & Plan:

## 2016-08-11 NOTE — Assessment & Plan Note (Signed)
Temporally related to carpeting Slow improvement with it removed Normal exam I recommend no more meds (like prednisone) as she should continue to improve back to baseline

## 2016-08-11 NOTE — Assessment & Plan Note (Signed)
Worse now--likely related to cough, etc Will add prn clonazepam  Continue the trazodone for now

## 2016-08-11 NOTE — Assessment & Plan Note (Signed)
Probably from extended cough, sleep problems,etc Will check labs

## 2016-08-24 ENCOUNTER — Encounter: Payer: Self-pay | Admitting: Internal Medicine

## 2016-09-12 DIAGNOSIS — H401132 Primary open-angle glaucoma, bilateral, moderate stage: Secondary | ICD-10-CM | POA: Diagnosis not present

## 2016-09-27 ENCOUNTER — Emergency Department
Admission: EM | Admit: 2016-09-27 | Discharge: 2016-09-27 | Disposition: A | Discharge status: Home / Self Care | Payer: Medicare HMO | Attending: Emergency Medicine | Admitting: Emergency Medicine

## 2016-09-27 ENCOUNTER — Encounter: Payer: Self-pay | Admitting: Emergency Medicine

## 2016-09-27 ENCOUNTER — Emergency Department: Payer: Medicare HMO

## 2016-09-27 DIAGNOSIS — M542 Cervicalgia: Secondary | ICD-10-CM | POA: Diagnosis not present

## 2016-09-27 DIAGNOSIS — S8992XA Unspecified injury of left lower leg, initial encounter: Secondary | ICD-10-CM | POA: Diagnosis not present

## 2016-09-27 DIAGNOSIS — M79632 Pain in left forearm: Secondary | ICD-10-CM | POA: Diagnosis not present

## 2016-09-27 DIAGNOSIS — Y9301 Activity, walking, marching and hiking: Secondary | ICD-10-CM | POA: Insufficient documentation

## 2016-09-27 DIAGNOSIS — Z79899 Other long term (current) drug therapy: Secondary | ICD-10-CM | POA: Diagnosis not present

## 2016-09-27 DIAGNOSIS — S00511A Abrasion of lip, initial encounter: Secondary | ICD-10-CM | POA: Insufficient documentation

## 2016-09-27 DIAGNOSIS — M79622 Pain in left upper arm: Secondary | ICD-10-CM | POA: Insufficient documentation

## 2016-09-27 DIAGNOSIS — Y999 Unspecified external cause status: Secondary | ICD-10-CM | POA: Insufficient documentation

## 2016-09-27 DIAGNOSIS — S4992XA Unspecified injury of left shoulder and upper arm, initial encounter: Secondary | ICD-10-CM | POA: Diagnosis not present

## 2016-09-27 DIAGNOSIS — W19XXXA Unspecified fall, initial encounter: Secondary | ICD-10-CM | POA: Diagnosis not present

## 2016-09-27 DIAGNOSIS — Y929 Unspecified place or not applicable: Secondary | ICD-10-CM | POA: Diagnosis not present

## 2016-09-27 DIAGNOSIS — S8991XA Unspecified injury of right lower leg, initial encounter: Secondary | ICD-10-CM | POA: Diagnosis not present

## 2016-09-27 DIAGNOSIS — S199XXA Unspecified injury of neck, initial encounter: Secondary | ICD-10-CM | POA: Diagnosis not present

## 2016-09-27 DIAGNOSIS — S0993XA Unspecified injury of face, initial encounter: Secondary | ICD-10-CM | POA: Diagnosis present

## 2016-09-27 DIAGNOSIS — M25562 Pain in left knee: Secondary | ICD-10-CM | POA: Diagnosis not present

## 2016-09-27 DIAGNOSIS — S0031XA Abrasion of nose, initial encounter: Secondary | ICD-10-CM | POA: Insufficient documentation

## 2016-09-27 DIAGNOSIS — M25561 Pain in right knee: Secondary | ICD-10-CM | POA: Diagnosis not present

## 2016-09-27 MED ORDER — TRAMADOL HCL 50 MG PO TABS: 50.0000 mg | ORAL_TABLET | Freq: Four times a day (QID) | ORAL | 0 refills | Status: AC | PRN

## 2016-09-27 MED ORDER — ORPHENADRINE CITRATE 30 MG/ML IJ SOLN
60.0000 mg | Freq: Two times a day (BID) | INTRAMUSCULAR | Status: DC
  Administered 2016-09-27: 60 mg via INTRAMUSCULAR
  Filled 2016-09-27: qty 2

## 2016-09-27 MED ORDER — ACETAMINOPHEN 325 MG PO TABS
650.0000 mg | ORAL_TABLET | Freq: Once | ORAL | Status: AC
Start: 2016-09-27 — End: 2016-09-27
  Administered 2016-09-27: 650 mg via ORAL
  Filled 2016-09-27: qty 2

## 2016-09-27 NOTE — ED Provider Notes (Signed)
[facility] Emergency Department Provider Note  ____________________________________________  Time seen: Approximately 8:17 PM  I have reviewed the triage vital signs and the nursing notes.   HISTORY  Chief Complaint Facial Injury    HPI Sheryl Miller is a 81 y.o. female presenting to the emergency department after a fall. Patient states that she fell and hit her face, sustaining abrasions to the nose and inner upper lip. Patient denies taking blood thinners. She reports 5 out of 10 bilateral knee pain, neck pain, left upper arm pain and mild headache. She denies new blurry vision, disorientation, nausea or vomiting. Patient is accompanied by a friend who reports no changes in behavior. Patient lives independently and sustained injury while canning. She denies weakness, radiculopathy or changes in sensation. No alleviating measures have been attempted.   Past Medical History:  Diagnosis Date  . Allergic rhinitis, cause unspecified   . Breast cyst   . Diverticulosis of colon (without mention of hemorrhage)   . Esophageal reflux   . Female stress incontinence   . Internal hemorrhoids without mention of complication   . Osteoporosis, unspecified   . Other and unspecified hyperlipidemia   . Raynaud's syndrome   . Rheumatic fever   . Unspecified constipation     Patient Active Problem List   Diagnosis Date Noted  . Neck pain 02/13/2016  . Advance directive discussed with patient 02/13/2016  . Nocturnal leg cramps 07/20/2015  . Family history of breast cancer in first degree relative 07/20/2015  . Hyperlipemia 02/12/2015  . Sleep disturbance 04/20/2014  . Fatigue 08/16/2013  . Cough 07/08/2012  . Routine general medical examination at a health care facility 08/16/2010  . PES PLANUS 02/11/2010  . UNEQUAL LEG LENGTH 02/11/2010  . VARICOSE VEINS, LOWER EXTREMITIES 12/01/2007  . DIVERTICULOSIS, COLON 06/24/2007  . Osteoporosis 06/24/2007  . URINARY  INCONTINENCE, STRESS, FEMALE 06/17/2007  . HEMORRHOIDS, INTERNAL 06/07/2007  . Chronic constipation 06/07/2007  . GERD 02/11/2007  . Allergic rhinitis due to pollen 07/09/2006    Past Surgical History:  Procedure Laterality Date  . ABDOMINAL HYSTERECTOMY    . BLADDER REPAIR    . BREAST BIOPSY Right 2000   fibrocystic disease  . KNEE ARTHROSCOPY Right ~5/16  . SHOULDER ARTHROSCOPY WITH ROTATOR CUFF REPAIR AND SUBACROMIAL DECOMPRESSION  08/2001   R, Alphonzo Severance  . SHOULDER SURGERY      In 2006, bilaterally shoulder surgery for rotator cuff.  . TONSILLECTOMY    . TUBAL LIGATION      Prior to Admission medications   Medication Sig Start Date End Date Taking? Authorizing Provider  Ascorbic Acid (VITAMIN C) 100 MG tablet Take 100 mg by mouth daily.    [provider]  clonazePAM (KLONOPIN) 0.5 MG tablet Take 0.5-1 tablets (0.25-0.5 mg total) by mouth at bedtime as needed for anxiety. 08/11/16   Venia Carbon, MD  COMBIGAN 0.2-0.5 % ophthalmic solution Place 1 drop into both eyes every 12 (twelve) hours. Reported on 03/05/2015 10/29/12   [provider]  Multiple Vitamins-Minerals (CENTRUM SILVER ULTRA WOMENS) TABS Take 1 tablet by mouth daily.     [provider]  simvastatin (ZOCOR) 40 MG tablet TAKE 1 TABLET AT BEDTIME 02/27/16   Venia Carbon, MD  traMADol (ULTRAM) 50 MG tablet Take 1 tablet (50 mg total) by mouth every 6 (six) hours as needed. 09/27/16 09/30/16  Lannie Fields, PA-C  traZODone (DESYREL) 150 MG tablet TAKE 1 TABLET AT BEDTIME AS NEEDED  FOR  SLEEP 03/31/16   Venia Carbon, MD    Allergies Patient has no known allergies.  Family History  Problem Relation Age of Onset  . Coronary artery disease Mother   . Heart disease Mother   . Diabetes Mother   . COPD Father   . Breast cancer Sister        2 (age 50?, age 93?)  . Diabetes Brother   . Breast cancer Maternal Aunt        2  . Stroke Paternal Grandmother   . Diabetes Brother    . Breast cancer Sister   . Breast cancer Daughter        ? at age 60    Social History Social History  Substance Use Topics  . Smoking status: Never Smoker  . Smokeless tobacco: Never Used  . Alcohol use No     Review of Systems  Constitutional: No fever/chills Eyes: No visual changes. No discharge ENT: No upper respiratory complaints. Cardiovascular: no chest pain. Respiratory: no cough. No SOB. Gastrointestinal: No abdominal pain.  No nausea, no vomiting.  No diarrhea.  No constipation. Musculoskeletal: Patient has bilateral knee pain, left upper arm pain and neck pain. Skin: Has bruising of the left upper arm. Neurological: Patient has mild headache, no focal weakness or numbness.   ____________________________________________   PHYSICAL EXAM:  VITAL SIGNS: ED Triage Vitals  Enc Vitals Group     BP 09/27/16 1852 (!) 176/74     Pulse Rate 09/27/16 1852 62     Resp 09/27/16 1852 18     Temp 09/27/16 1852 97.7 F (36.5 C)     Temp Source 09/27/16 1852 Oral     SpO2 09/27/16 1852 99 %     Weight 09/27/16 1855 150 lb (68 kg)     Height 09/27/16 1855 5\' 3"  (1.6 m)     Head Circumference --      Peak Flow --      Pain Score 09/27/16 1852 5     Pain Loc --      Pain Edu? --      Excl. in [place]? --    Constitutional: Alert and oriented. Patient is talkative and engaged.  Eyes: Palpebral and bulbar conjunctiva are nonerythematous bilaterally. PERRL. EOMI.  Head: Atraumatic. ENT:      Ears: Tympanic membranes are pearly bilaterally without bloody effusion visualized.       Nose: Nasal septum is midline without evidence of blood or septal hematoma. Small abrasion of nose visualized.      Mouth/Throat: Mucous membranes are moist. Uvula is midline. Patient has abrasion of midline upper lip. Neck: Full range of motion. No pain with neck flexion. Pain elicited with palpation along the cervical spine. Cardiovascular: No pain with palpation over the anterior and posterior  chest wall. Normal rate, regular rhythm. Normal S1 and S2. No murmurs, gallops or rubs auscultated.  Respiratory: Trachea is midline. Resonant and symmetric percussion tones bilaterally. On auscultation, adventitious sounds are absent.  Gastrointestinal:Abdomen is symmetric. Bowel sounds positive in all 4 quadrants. Musculature soft and relaxed to light palpation. No masses or areas of tenderness to deep palpation. No costovertebral angle tenderness bilaterally.  Musculoskeletal: Patient has 5/5 strength in the upper and lower extremities bilaterally. Full range of motion at the shoulder, elbow and wrist bilaterally. Full range of motion at the hip, knee and ankle bilaterally. Bilateral knees: Negative anterior and posterior drawer test. Negative ballottement. No laxity of the MCL or LCL  testing. Negative apprehension. No changes in gait. Palpable radial, ulnar and dorsalis pedis pulses bilaterally and symmetrically. Neurologic: Normal speech and language. No gross focal neurologic deficits are appreciated. Cranial nerves: 2-10 normal as tested. Cerebellar: Finger-nose-finger WNL, heel to shin WNL. Sensorimotor: No sensory loss or abnormal reflexes. Vision: No visual field deficts noted to confrontation.  Speech: No dysarthria or expressive aphasia.  Skin:  Skin is warm, dry and intact. No rash or bruising noted.  Psychiatric: Mood and affect are normal for age. Speech and behavior are normal.     ____________________________________________   LABS (all labs ordered are listed, but only abnormal results are displayed)  Labs Reviewed - No data to display ____________________________________________  EKG   ____________________________________________  RADIOLOGY Unk Pinto, personally viewed and evaluated these images (plain radiographs) as part of my medical decision making, as well as reviewing the written report by the radiologist.    Dg Cervical Spine 2-3 Views  Result Date:  09/27/2016 CLINICAL DATA:  Left-sided neck pain after trip and fall this evening. EXAM: CERVICAL SPINE - 2-3 VIEW COMPARISON:  None. FINDINGS: Cervical spondylosis with disc space narrowing from C4 through C7 and associated facet arthropathy. Anterior osteophytes are noted from C4 through C7. No jumped or perched appearing facets. The craniocervical relationship and atlantodental interval are within normal limits. No vertebral body fracture. C4-5, C5-6 and C6-7 uncovertebral joint hypertrophy, joint space narrowing and spurring consistent with osteoarthritis. IMPRESSION: 1. Degenerative disc disease C4 through C7 with associated facet and uncovertebral joint osteoarthritis bilaterally. 2. No acute cervical spine fracture. Electronically Signed   By: Ashley Royalty M.D.   On: 09/27/2016 21:06   Dg Knee Complete 4 Views Left  Result Date: 09/27/2016 CLINICAL DATA:  Left knee pain after trip and fall this evening EXAM: LEFT KNEE - COMPLETE 4+ VIEW COMPARISON:  None. FINDINGS: No evidence of fracture, dislocation, or joint effusion. Chondrocalcinosis hyaline cartilage noted within the femorotibial compartment. Varicose veins are noted along the anterolateral aspect of the included leg. IMPRESSION: No acute fracture or dislocation of the left knee. No joint effusion. Electronically Signed   By: Ashley Royalty M.D.   On: 09/27/2016 21:09   Dg Knee Complete 4 Views Right  Result Date: 09/27/2016 CLINICAL DATA:  Pain after a fall EXAM: RIGHT KNEE - COMPLETE 4+ VIEW COMPARISON:  06/22/2014, 06/26/2014 FINDINGS: No acute displaced fracture or malalignment is seen. No large effusion. Mild patellofemoral degenerative changes and degenerative changes of the medial compartment. Joint space calcifications. Articular surface irregularity and subchondral lucency of the medial femoral condyle. IMPRESSION: 1. No acute displaced fracture or malalignment 2. Articular surface irregularity and subchondral lucency medial femoral condyle  a raises possibility of an osteochondral lesion, nonemergent MRI suggested for further evaluation. Electronically Signed   By: Donavan Foil M.D.   On: 09/27/2016 21:08   Dg Humerus Left  Result Date: 09/27/2016 CLINICAL DATA:  Arm pain after a fall EXAM: LEFT HUMERUS - 2+ VIEW COMPARISON:  None. FINDINGS: Postsurgical changes of the left humeral head. No acute displaced fracture or malalignment is seen. Soft tissues are unremarkable. IMPRESSION: No acute osseous abnormality Electronically Signed   By: Donavan Foil M.D.   On: 09/27/2016 21:10    ____________________________________________    PROCEDURES  Procedure(s) performed:    Procedures    Medications  orphenadrine (NORFLEX) injection 60 mg (60 mg Intramuscular Given 09/27/16 2049)  acetaminophen (TYLENOL) tablet 650 mg (650 mg Oral Given 09/27/16 2048)     ____________________________________________  INITIAL IMPRESSION / ASSESSMENT AND PLAN / ED COURSE  Pertinent labs & imaging results that were available during my care of the patient were reviewed by me and considered in my medical decision making (see chart for details).  Review of the [place] CSRS was performed in accordance of the [place] prior to dispensing any controlled drugs.     Assessment and plan Fall: Patient presents to the emergency department after falling in her home. Patient did not lose consciousness. She has experienced no changes in vision, orientation, nausea or vomiting. CT head is not warranted at this time. Patient education has been provided regarding observation and patient was advised to return to the emergency department if aforementioned symptoms occur. X-ray examination conducted in the emergency department was unremarkable for acute fractures. DG right knee revealed a concerning lucency and patient was advised to schedule elective MRI for further evaluation. Patient was discharged with a short course of tramadol. She was advised to follow up with  orthopedics as needed, Dr. Mack Guise. All patient questions were answered.     ____________________________________________  FINAL CLINICAL IMPRESSION(S) / ED DIAGNOSES  Final diagnoses:  Fall, initial encounter      NEW MEDICATIONS STARTED DURING THIS VISIT:  Discharge Medication List as of 09/27/2016 10:04 PM    START taking these medications   Details  traMADol (ULTRAM) 50 MG tablet Take 1 tablet (50 mg total) by mouth every 6 (six) hours as needed., Starting Sat 09/27/2016, Until Tue 09/30/2016, Print            This chart was dictated using voice recognition software/Dragon. Despite best efforts to proofread, errors can occur which can change the meaning. Any change was purely unintentional.    Lannie Fields, PA-C 09/27/16 2313    Schuyler Amor, MD 09/27/16 [phone]

## 2016-09-27 NOTE — ED Triage Notes (Signed)
States tripped and fell 1 hour ago. Small abrasion nose and injury upper lip. States also pain L upper arm. Noted tender with palpation. Denies LOC. Denies taking blood thinners.

## 2016-09-27 NOTE — ED Notes (Signed)
Abrasion to nose and cut to inside upper lip after trip and fall

## 2016-09-30 ENCOUNTER — Telehealth: Payer: Self-pay

## 2016-09-30 NOTE — Telephone Encounter (Signed)
Spoke to pt about recent ER visit. She said she is doing okay. Pretty bruised up. She is not planning to do any referrals. Dr Silvio Pate is aware.

## 2016-10-06 ENCOUNTER — Other Ambulatory Visit: Payer: Self-pay | Admitting: Internal Medicine

## 2016-10-06 MED ORDER — CLONAZEPAM 0.5 MG PO TABS: 0.2500 mg | ORAL_TABLET | Freq: Every evening | ORAL | 0 refills | Status: DC | PRN

## 2016-10-06 NOTE — Telephone Encounter (Signed)
Left refill on voice mail at pharmacy  

## 2016-10-06 NOTE — Telephone Encounter (Signed)
Last filled 08-11-16 #30 Last CPE 02-13-16 Acute OV 08-11-16 No Future OV

## 2016-10-06 NOTE — Telephone Encounter (Signed)
Approved: 30 x 0 

## 2016-12-01 DIAGNOSIS — D229 Melanocytic nevi, unspecified: Secondary | ICD-10-CM | POA: Diagnosis not present

## 2016-12-01 DIAGNOSIS — L821 Other seborrheic keratosis: Secondary | ICD-10-CM | POA: Diagnosis not present

## 2016-12-01 DIAGNOSIS — I8393 Asymptomatic varicose veins of bilateral lower extremities: Secondary | ICD-10-CM | POA: Diagnosis not present

## 2016-12-01 DIAGNOSIS — L820 Inflamed seborrheic keratosis: Secondary | ICD-10-CM | POA: Diagnosis not present

## 2016-12-01 DIAGNOSIS — D1800 Hemangioma unspecified site: Secondary | ICD-10-CM | POA: Diagnosis not present

## 2016-12-01 DIAGNOSIS — L858 Other specified epidermal thickening: Secondary | ICD-10-CM | POA: Diagnosis not present

## 2016-12-01 DIAGNOSIS — I781 Nevus, non-neoplastic: Secondary | ICD-10-CM | POA: Diagnosis not present

## 2016-12-01 DIAGNOSIS — Z1283 Encounter for screening for malignant neoplasm of skin: Secondary | ICD-10-CM | POA: Diagnosis not present

## 2016-12-01 DIAGNOSIS — D692 Other nonthrombocytopenic purpura: Secondary | ICD-10-CM | POA: Diagnosis not present

## 2016-12-08 ENCOUNTER — Ambulatory Visit (INDEPENDENT_AMBULATORY_CARE_PROVIDER_SITE_OTHER): Payer: Commercial Managed Care - HMO | Admitting: Podiatry

## 2016-12-08 ENCOUNTER — Encounter: Payer: Self-pay | Admitting: Podiatry

## 2016-12-08 DIAGNOSIS — L600 Ingrowing nail: Secondary | ICD-10-CM | POA: Diagnosis not present

## 2016-12-08 DIAGNOSIS — M722 Plantar fascial fibromatosis: Secondary | ICD-10-CM | POA: Diagnosis not present

## 2016-12-08 MED ORDER — NEOMYCIN-POLYMYXIN-HC 1 % OT SOLN: OTIC | 1 refills | Status: DC

## 2016-12-08 NOTE — Progress Notes (Signed)
She presents today for chief complaint of ingrown toenail to the hallux right. Symptoms been present for quite some time.  Objective: Vital signs are stable she is alert and oriented 3 pulses are palpable. Sharp incurvated nail margins with mild erythema and edema with pain on palpation hallux right.  Assessment: Ingrown nail paronychia hallux right.  Plan: Chemical matrixectomy tibial and fibular border hallux right after local anesthesia was achieved. Tolerated procedure well without complications. Provided with both oral and written home-going instructions for care and soaking of her toe as well as a prescription for Cortisporin otic to be applied twice daily after soaking. I will follow-up with her in 2 weeks.

## 2016-12-08 NOTE — Patient Instructions (Signed)

## 2016-12-08 NOTE — Addendum Note (Signed)
Addended by: Clovis Riley E on: 12/08/2016 04:50 PM   Modules accepted: Orders

## 2016-12-13 ENCOUNTER — Other Ambulatory Visit: Payer: Self-pay | Admitting: Internal Medicine

## 2016-12-22 ENCOUNTER — Encounter: Payer: Self-pay | Admitting: Podiatry

## 2016-12-22 ENCOUNTER — Ambulatory Visit (INDEPENDENT_AMBULATORY_CARE_PROVIDER_SITE_OTHER): Payer: Commercial Managed Care - HMO | Admitting: Podiatry

## 2016-12-22 DIAGNOSIS — L600 Ingrowing nail: Secondary | ICD-10-CM

## 2016-12-22 NOTE — Progress Notes (Signed)
She presents today for follow-up of matrixectomy hallux right. She states this seems to be doing just fine. She continued to soak until just a few days ago.  Objective: Scab is present along the medial lateral borders no purulence or malodor no pain on palpation no erythema cellulitis drainage or odor.  Assessment: Well-healing surgical toe hallux right.  Plan: Follow up with me as needed. Encouraged her to continue soak at least every other day he.

## 2016-12-22 NOTE — Patient Instructions (Signed)

## 2017-01-16 ENCOUNTER — Encounter: Payer: Self-pay | Admitting: Internal Medicine

## 2017-03-13 DIAGNOSIS — H401132 Primary open-angle glaucoma, bilateral, moderate stage: Secondary | ICD-10-CM | POA: Diagnosis not present

## 2017-03-20 DIAGNOSIS — H401122 Primary open-angle glaucoma, left eye, moderate stage: Secondary | ICD-10-CM | POA: Diagnosis not present

## 2017-03-30 ENCOUNTER — Encounter: Payer: Self-pay | Admitting: Internal Medicine

## 2017-05-25 DIAGNOSIS — M1712 Unilateral primary osteoarthritis, left knee: Secondary | ICD-10-CM | POA: Diagnosis not present

## 2017-05-25 DIAGNOSIS — S8392XA Sprain of unspecified site of left knee, initial encounter: Secondary | ICD-10-CM | POA: Diagnosis not present

## 2017-05-27 ENCOUNTER — Ambulatory Visit (INDEPENDENT_AMBULATORY_CARE_PROVIDER_SITE_OTHER): Payer: Commercial Managed Care - HMO | Admitting: Orthopedic Surgery

## 2017-06-04 DIAGNOSIS — M25562 Pain in left knee: Secondary | ICD-10-CM | POA: Diagnosis not present

## 2017-06-04 DIAGNOSIS — M25662 Stiffness of left knee, not elsewhere classified: Secondary | ICD-10-CM | POA: Diagnosis not present

## 2017-06-05 ENCOUNTER — Other Ambulatory Visit: Payer: Self-pay | Admitting: Internal Medicine

## 2017-06-09 DIAGNOSIS — M25562 Pain in left knee: Secondary | ICD-10-CM | POA: Diagnosis not present

## 2017-06-10 DIAGNOSIS — M25562 Pain in left knee: Secondary | ICD-10-CM | POA: Diagnosis not present

## 2017-06-10 DIAGNOSIS — R600 Localized edema: Secondary | ICD-10-CM | POA: Diagnosis not present

## 2017-06-10 DIAGNOSIS — M25662 Stiffness of left knee, not elsewhere classified: Secondary | ICD-10-CM | POA: Diagnosis not present

## 2017-06-12 DIAGNOSIS — M25562 Pain in left knee: Secondary | ICD-10-CM | POA: Diagnosis not present

## 2017-06-12 DIAGNOSIS — R609 Edema, unspecified: Secondary | ICD-10-CM | POA: Diagnosis not present

## 2017-06-12 DIAGNOSIS — M25662 Stiffness of left knee, not elsewhere classified: Secondary | ICD-10-CM | POA: Diagnosis not present

## 2017-06-16 DIAGNOSIS — R609 Edema, unspecified: Secondary | ICD-10-CM | POA: Diagnosis not present

## 2017-06-16 DIAGNOSIS — M25662 Stiffness of left knee, not elsewhere classified: Secondary | ICD-10-CM | POA: Diagnosis not present

## 2017-06-16 DIAGNOSIS — M25562 Pain in left knee: Secondary | ICD-10-CM | POA: Diagnosis not present

## 2017-06-19 DIAGNOSIS — R600 Localized edema: Secondary | ICD-10-CM | POA: Diagnosis not present

## 2017-06-19 DIAGNOSIS — M25562 Pain in left knee: Secondary | ICD-10-CM | POA: Diagnosis not present

## 2017-06-19 DIAGNOSIS — M25662 Stiffness of left knee, not elsewhere classified: Secondary | ICD-10-CM | POA: Diagnosis not present

## 2017-06-25 DIAGNOSIS — M1711 Unilateral primary osteoarthritis, right knee: Secondary | ICD-10-CM | POA: Diagnosis not present

## 2017-06-25 DIAGNOSIS — M25562 Pain in left knee: Secondary | ICD-10-CM | POA: Diagnosis not present

## 2017-07-06 DIAGNOSIS — M25562 Pain in left knee: Secondary | ICD-10-CM | POA: Diagnosis not present

## 2017-07-07 DIAGNOSIS — M25562 Pain in left knee: Secondary | ICD-10-CM | POA: Diagnosis not present

## 2017-07-10 ENCOUNTER — Encounter: Payer: Self-pay | Admitting: Internal Medicine

## 2017-07-10 ENCOUNTER — Ambulatory Visit (INDEPENDENT_AMBULATORY_CARE_PROVIDER_SITE_OTHER): Payer: Medicare HMO | Admitting: Internal Medicine

## 2017-07-10 VITALS — BP 114/72 | HR 70 | Temp 97.9°F | Ht 62.0 in | Wt 143.0 lb

## 2017-07-10 DIAGNOSIS — J301 Allergic rhinitis due to pollen: Secondary | ICD-10-CM | POA: Diagnosis not present

## 2017-07-10 DIAGNOSIS — M159 Polyosteoarthritis, unspecified: Secondary | ICD-10-CM

## 2017-07-10 DIAGNOSIS — Z0000 Encounter for general adult medical examination without abnormal findings: Secondary | ICD-10-CM

## 2017-07-10 DIAGNOSIS — Z7189 Other specified counseling: Secondary | ICD-10-CM

## 2017-07-10 DIAGNOSIS — G479 Sleep disorder, unspecified: Secondary | ICD-10-CM

## 2017-07-10 DIAGNOSIS — E785 Hyperlipidemia, unspecified: Secondary | ICD-10-CM

## 2017-07-10 DIAGNOSIS — K219 Gastro-esophageal reflux disease without esophagitis: Secondary | ICD-10-CM | POA: Diagnosis not present

## 2017-07-10 DIAGNOSIS — Z23 Encounter for immunization: Secondary | ICD-10-CM | POA: Diagnosis not present

## 2017-07-10 LAB — CBC
HEMATOCRIT: 38.7 % (ref 36.0–46.0)
Hemoglobin: 13.1 g/dL (ref 12.0–15.0)
MCHC: 33.8 g/dL (ref 30.0–36.0)
MCV: 90.3 fl (ref 78.0–100.0)
Platelets: 262 10*3/uL (ref 150.0–400.0)
RBC: 4.29 Mil/uL (ref 3.87–5.11)
RDW: 13.6 % (ref 11.5–15.5)
WBC: 5 10*3/uL (ref 4.0–10.5)

## 2017-07-10 LAB — COMPREHENSIVE METABOLIC PANEL
ALBUMIN: 4 g/dL (ref 3.5–5.2)
ALK PHOS: 59 U/L (ref 39–117)
ALT: 16 U/L (ref 0–35)
AST: 20 U/L (ref 0–37)
BUN: 10 mg/dL (ref 6–23)
CHLORIDE: 98 meq/L (ref 96–112)
CO2: 31 mEq/L (ref 19–32)
CREATININE: 0.56 mg/dL (ref 0.40–1.20)
Calcium: 9.3 mg/dL (ref 8.4–10.5)
GFR: 110.73 mL/min (ref >60.00)
Glucose, Bld: 104 mg/dL — ABNORMAL HIGH (ref 70–99)
Potassium: 4.3 mEq/L (ref 3.5–5.1)
SODIUM: 135 meq/L (ref 135–145)
TOTAL PROTEIN: 6.7 g/dL (ref 6.0–8.3)
Total Bilirubin: 0.6 mg/dL (ref 0.2–1.2)

## 2017-07-10 LAB — LIPID PANEL
CHOLESTEROL: 181 mg/dL (ref 0–200)
HDL: 79.6 mg/dL (ref >39.00)
LDL CALC: 92 mg/dL (ref 0–99)
NonHDL: 101.41
Total CHOL/HDL Ratio: 2
Triglycerides: 48 mg/dL (ref 0.0–149.0)
VLDL: 9.6 mg/dL (ref 0.0–40.0)

## 2017-07-10 MED ORDER — IPRATROPIUM BROMIDE 0.03 % NA SOLN: 2.0000 | Freq: Two times a day (BID) | NASAL | 12 refills | Status: DC

## 2017-07-10 NOTE — Assessment & Plan Note (Signed)
See social history 

## 2017-07-10 NOTE — Assessment & Plan Note (Signed)
Still okay with primary prevention No problems with statin

## 2017-07-10 NOTE — Assessment & Plan Note (Signed)
Not really having heartburn--but some dysphagia Asked her to try PPI for a while

## 2017-07-10 NOTE — Assessment & Plan Note (Signed)
Discussed sleep hygiene Still on trazodone

## 2017-07-10 NOTE — Progress Notes (Signed)
Subjective:    Patient ID: Sheryl Miller, female    DOB: 11/10/1935, 82 y.o.   MRN: [id number]  HPI Here for Medicare wellness visit and follow up of chronic health conditions Reviewed form and advanced directives Reviewed other doctors No depression or anhedonia Occasional glass of wine No tobacco Not much exercise--knee pain Fell 2-3 times--meniscus tear and facial laceration Independent with instrumental ADLs  Bothered by constant runny nose Seems to be more when eating Not really pollen related Discussed medication for this  Ongoing sleep problems --despite trazodone  Watches TV till bedtime--movies Tries to go to sleep at 11PM---usually up around 6:30AM Initiates okay--but trouble maintaining Intermittent nocturia Doesn't seem to have daytime somnolence Has tried sleepy tyme tea--helps  Has torn meniscus Seeing orthopedist  Not much pollen problems Discussed antihistamine prn  Still on the statin No myalgias or GI problems  Current Outpatient Medications on File Prior to Visit  Medication Sig Dispense Refill  . Ascorbic Acid (VITAMIN C) 100 MG tablet Take 100 mg by mouth daily.    Marland Kitchen aspirin EC 81 MG tablet Take 81 mg by mouth daily.    . COMBIGAN 0.2-0.5 % ophthalmic solution Place 1 drop into both eyes every 12 (twelve) hours. Reported on 03/05/2015    . Cyanocobalamin (B-12) 5000 MCG SUBL Place under the tongue.    . Multiple Vitamins-Minerals (CENTRUM SILVER ULTRA WOMENS) TABS Take 1 tablet by mouth daily.     . simvastatin (ZOCOR) 40 MG tablet TAKE 1 TABLET AT BEDTIME 90 tablet 0  . traZODone (DESYREL) 150 MG tablet TAKE 1 TABLET AT BEDTIME AS NEEDED  FOR  SLEEP 90 tablet 3  . TURMERIC PO Take by mouth.     No current facility-administered medications on file prior to visit.     No Known Allergies  Past Medical History:  Diagnosis Date  . Allergic rhinitis, cause unspecified   . Breast cyst   . Diverticulosis of colon (without mention of  hemorrhage)   . Esophageal reflux   . Female stress incontinence   . Internal hemorrhoids without mention of complication   . Osteoporosis, unspecified   . Other and unspecified hyperlipidemia   . Raynaud's syndrome   . Rheumatic fever   . Unspecified constipation     Past Surgical History:  Procedure Laterality Date  . ABDOMINAL HYSTERECTOMY    . BLADDER REPAIR    . BREAST BIOPSY Right 2000   fibrocystic disease  . KNEE ARTHROSCOPY Right ~5/16  . SHOULDER ARTHROSCOPY WITH ROTATOR CUFF REPAIR AND SUBACROMIAL DECOMPRESSION  08/2001   R, Alphonzo Severance  . SHOULDER SURGERY      In 2006, bilaterally shoulder surgery for rotator cuff.  . TONSILLECTOMY    . TUBAL LIGATION      Family History  Problem Relation Age of Onset  . Coronary artery disease Mother   . Heart disease Mother   . Diabetes Mother   . COPD Father   . Breast cancer Sister        2 (age 45?, age 38?)  . Diabetes Brother   . Breast cancer Maternal Aunt        2  . Stroke Paternal Grandmother   . Diabetes Brother   . Breast cancer Sister   . Breast cancer Daughter        ? at age 73    Social History   Socioeconomic History  . Marital status: Married    Spouse name: Not on file  .  Number of children: 4  . Years of education: Not on file  . Highest education level: Not on file  Occupational History  . Occupation: retired Scientist, clinical (histocompatibility and immunogenetics): RETIRED  Social Needs  . Financial resource strain: Not on file  . Food insecurity:    Worry: Not on file    Inability: Not on file  . Transportation needs:    Medical: Not on file    Non-medical: Not on file  Tobacco Use  . Smoking status: Never Smoker  . Smokeless tobacco: Never Used  Substance and Sexual Activity  . Alcohol use: No    Alcohol/week: 0.0 oz  . Drug use: No  . Sexual activity: Not on file  Lifestyle  . Physical activity:    Days per week: Not on file    Minutes per session: Not on file  . Stress: Not on file  Relationships  . Social  connections:    Talks on phone: Not on file    Gets together: Not on file    Attends religious service: Not on file    Active member of club or organization: Not on file    Attends meetings of clubs or organizations: Not on file    Relationship status: Not on file  . Intimate partner violence:    Fear of current or ex partner: Not on file    Emotionally abused: Not on file    Physically abused: Not on file    Forced sexual activity: Not on file  Other Topics Concern  . Not on file  Social History Narrative   Regular exercise-yes, walking or rides stationery bike daily      Has living will.    Husband should make health care decisions as needed, then son Herbie [place].    Would accept resuscitation but no prolonged ventilation.    Would not want feeding tube for extended time if not cognitively aware            Review of Systems Appetite is okay Has lost a few pounds---working on lifestyle Wears seat belt Bowels are slow---has to force it sometimes. Tried prunes---recommended miralax Urinary stream is slower--some dribbling (rarely uses thin pad) Ongoing arthritis problems No skin rash or lesions. Keeps up with derm Full upper, partial lower. Keeps up with dentist No heartburn but some swallowing problems (like pills) No chest pain or SOB No dizziness or syncope    Objective:   Physical Exam  Constitutional: She is oriented to person, place, and time. She appears well-developed. No distress.  HENT:  Head: Normocephalic.  Mouth/Throat: Oropharynx is clear and moist. No oropharyngeal exudate.  Moderate inflammation right nare  Neck: No thyromegaly present.  Cardiovascular: Normal rate, regular rhythm, normal heart sounds and intact distal pulses. Exam reveals no gallop.  No murmur heard. Pulmonary/Chest: Effort normal and breath sounds normal. No stridor. No respiratory distress. She has no wheezes. She has no rales.  Abdominal: Soft. There is no tenderness.  Musculoskeletal:  She exhibits no tenderness.  Mild left calf swelling relative to right--but no tenderness (discussed support hose for upcoming travel)  Neurological: She is alert and oriented to person, place, and time.  President-- "Trump, Obama, Clinton---Bush" 100-94-? [id number]-w Recall 2/3  Skin: Skin is warm. No rash noted.  Psychiatric: She has a normal mood and affect. Her behavior is normal.          Assessment & Plan:

## 2017-07-10 NOTE — Patient Instructions (Addendum)
You can try loratadine 10-20mg  or cetirizine 10mg  daily for allergies or runny nose. Please try over the counter omeprazole 61m nightly at bedtime for 2-4 weeks to see if that helps your swallowing.

## 2017-07-10 NOTE — Progress Notes (Signed)
Hearing Screening   Method: Audiometry   125Hz  250Hz  500Hz  1000Hz  2000Hz  3000Hz  4000Hz  6000Hz  8000Hz   Right ear:   20 40 20  40    Left ear:   20 20 20   40    Vision Screening Comments: January 2019

## 2017-07-10 NOTE — Assessment & Plan Note (Signed)
Mostly with rhinorrhea that may be vasomotor Can try antihistamine but Rx done to try ipratropium spray

## 2017-07-10 NOTE — Assessment & Plan Note (Signed)
Seeing orthopedist

## 2017-07-10 NOTE — Addendum Note (Signed)
Addended by: Pilar Grammes on: 07/10/2017 10:46 AM   Modules accepted: Orders

## 2017-07-10 NOTE — Assessment & Plan Note (Signed)
I have personally reviewed the Medicare Annual Wellness questionnaire and have noted 1. The patient's medical and social history 2. Their use of alcohol, tobacco or illicit drugs 3. Their current medications and supplements 4. The patient's functional ability including ADL's, fall risks, home safety risks and hearing or visual             impairment. 5. Diet and physical activities 6. Evidence for depression or mood disorders  The patients weight, height, BMI and visual acuity have been recorded in the chart I have made referrals, counseling and provided education to the patient based review of the above and I have provided the pt with a written personalized care plan for preventive services.  I have provided you with a copy of your personalized plan for preventive services. Please take the time to review along with your updated medication list.  Will update pneumovax Yearly flu vaccine Done with colon--normal 2012 Prefers to continue yearly mammmogram--will discuss over time Discussed exercise

## 2017-08-05 ENCOUNTER — Other Ambulatory Visit: Payer: Self-pay | Admitting: Internal Medicine

## 2017-08-12 ENCOUNTER — Ambulatory Visit: Payer: Medicare HMO | Admitting: Obstetrics and Gynecology

## 2017-08-12 ENCOUNTER — Encounter: Payer: Self-pay | Admitting: Obstetrics and Gynecology

## 2017-08-12 VITALS — BP 108/58 | HR 68 | Ht 62.0 in | Wt 151.0 lb

## 2017-08-12 DIAGNOSIS — Z803 Family history of malignant neoplasm of breast: Secondary | ICD-10-CM

## 2017-08-12 DIAGNOSIS — Z8481 Family history of carrier of genetic disease: Secondary | ICD-10-CM | POA: Insufficient documentation

## 2017-08-12 DIAGNOSIS — Z1231 Encounter for screening mammogram for malignant neoplasm of breast: Secondary | ICD-10-CM | POA: Diagnosis not present

## 2017-08-12 DIAGNOSIS — Z1239 Encounter for other screening for malignant neoplasm of breast: Secondary | ICD-10-CM

## 2017-08-12 NOTE — Patient Instructions (Signed)
I value your feedback and entrusting us with your care. If you get a [facility] patient survey, I would appreciate you taking the time to let us know about your experience today. Thank you! 

## 2017-08-12 NOTE — Progress Notes (Signed)
Venia Carbon, MD   Chief Complaint  Patient presents with  . Breast Problem    Lumps in left breast, dull pain in upper left side    HPI:      [name] is a 82 y.o. G4P4 who LMP was No LMP recorded. Patient has had a hysterectomy., presents today for LT breast lumps posterior breast, noticed on SBE last wk. Breasts can be tender but not in area of lumps. No erythema, nipple d/c, trauma. She rarely does SBE. 07/21/16 mammo was [facility] 2 with RT breast cyst. There is a STRONG FH of breast cancer in her sisters, mat aunts, and daughter. Pt states her sister is "gene pos" but pt doesn't know what gene. Pt has never had genetic testing done.  S/p TAHBSO years ago--pt doesn't remember why, but not for cancer.   Past Medical History:  Diagnosis Date  . Allergic rhinitis, cause unspecified   . Breast cyst   . Diverticulosis of colon (without mention of hemorrhage)   . Esophageal reflux   . Female stress incontinence   . Internal hemorrhoids without mention of complication   . Osteoporosis, unspecified   . Other and unspecified hyperlipidemia   . Raynaud's syndrome   . Rheumatic fever   . Unspecified constipation     Past Surgical History:  Procedure Laterality Date  . ABDOMINAL HYSTERECTOMY    . BLADDER REPAIR    . BREAST BIOPSY Right 2000   fibrocystic disease  . KNEE ARTHROSCOPY Right ~5/16  . SHOULDER ARTHROSCOPY WITH ROTATOR CUFF REPAIR AND SUBACROMIAL DECOMPRESSION  08/2001   R, Alphonzo Severance  . SHOULDER SURGERY      In 2006, bilaterally shoulder surgery for rotator cuff.  . TONSILLECTOMY    . TUBAL LIGATION      Family History  Problem Relation Age of Onset  . Coronary artery disease Mother   . Heart disease Mother   . Diabetes Mother   . COPD Father   . Breast cancer Sister        2 (age 57?, age 57?); "Gene pos"  . Diabetes Brother   . Breast cancer Maternal Aunt        2  . Stroke Paternal Grandmother   . Diabetes Brother   . Breast cancer  Sister   . Breast cancer Daughter        ? at age 7    Social History   Socioeconomic History  . Marital status: Married    Spouse name: Not on file  . Number of children: 4  . Years of education: Not on file  . Highest education level: Not on file  Occupational History  . Occupation: retired Scientist, clinical (histocompatibility and immunogenetics): RETIRED  Social Needs  . Financial resource strain: Not on file  . Food insecurity:    Worry: Not on file    Inability: Not on file  . Transportation needs:    Medical: Not on file    Non-medical: Not on file  Tobacco Use  . Smoking status: Never Smoker  . Smokeless tobacco: Never Used  Substance and Sexual Activity  . Alcohol use: No    Alcohol/week: 0.0 oz  . Drug use: No  . Sexual activity: Yes    Birth control/protection: Post-menopausal  Lifestyle  . Physical activity:    Days per week: Not on file    Minutes per session: Not on file  . Stress: Not on file  Relationships  .  Social connections:    Talks on phone: Not on file    Gets together: Not on file    Attends religious service: Not on file    Active member of club or organization: Not on file    Attends meetings of clubs or organizations: Not on file    Relationship status: Not on file  . Intimate partner violence:    Fear of current or ex partner: Not on file    Emotionally abused: Not on file    Physically abused: Not on file    Forced sexual activity: Not on file  Other Topics Concern  . Not on file  Social History Narrative   Regular exercise-yes, walking or rides stationery bike daily      Has living will.    Husband should make health care decisions as needed, then son Herbie [place].    Would accept resuscitation but no prolonged ventilation.    Would not want feeding tube for extended time if not cognitively aware             Outpatient Medications Prior to Visit  Medication Sig Dispense Refill  . COMBIGAN 0.2-0.5 % ophthalmic solution Place 1 drop into both eyes every 12 (twelve)  hours. Reported on 03/05/2015    . Cyanocobalamin (B-12) 5000 MCG SUBL Place under the tongue.    . Multiple Vitamins-Minerals (CENTRUM SILVER ULTRA WOMENS) TABS Take 1 tablet by mouth daily.     . simvastatin (ZOCOR) 40 MG tablet TAKE 1 TABLET AT BEDTIME 90 tablet 0  . traZODone (DESYREL) 150 MG tablet TAKE 1 TABLET AT BEDTIME AS NEEDED  FOR  SLEEP 90 tablet 3  . TURMERIC PO Take by mouth.    . Ascorbic Acid (VITAMIN C) 100 MG tablet Take 100 mg by mouth daily.    Marland Kitchen aspirin EC 81 MG tablet Take 81 mg by mouth daily.    . diclofenac sodium (VOLTAREN) 1 % GEL   3  . ipratropium (ATROVENT) 0.03 % nasal spray Place 2 sprays into both nostrils every 12 (twelve) hours. (Patient not taking: Reported on 08/12/2017) 30 mL 12  . meloxicam (MOBIC) 15 MG tablet     . traMADol (ULTRAM) 50 MG tablet      No facility-administered medications prior to visit.     ROS:  Review of Systems  Constitutional: Negative for fever.  Gastrointestinal: Negative for blood in stool, constipation, diarrhea, nausea and vomiting.  Genitourinary: Negative for dyspareunia, dysuria, flank pain, frequency, hematuria, urgency, vaginal bleeding, vaginal discharge and vaginal pain.  Musculoskeletal: Negative for back pain.  Skin: Negative for rash.   BREAST: mass, tenderness   OBJECTIVE:   Vitals:  BP (!) 108/58   Pulse 68   Ht 5\' 2"  (1.575 m)   Wt 151 lb (68.5 kg)   BMI 27.62 kg/m   Physical Exam  Constitutional: She is oriented to person, place, and time. She appears well-developed.  Neck: Normal range of motion.  Pulmonary/Chest: Effort normal. Right breast exhibits no inverted nipple, no mass, no nipple discharge, no skin change and no tenderness. Left breast exhibits no inverted nipple, no mass, no nipple discharge, no skin change and no tenderness. Breasts are symmetrical.  Lymphadenopathy:    She has no axillary adenopathy.  Neurological: She is alert and oriented to person, place, and time. No cranial  nerve deficit.  Psychiatric: She has a normal mood and affect. Her behavior is normal. Judgment and thought content normal.  Vitals reviewed.   Assessment/Plan: Screening  for breast cancer - Neg breast exam. No masses. Pt to sched scr 3D mammo. Cont SBE. - Plan: MM 3D SCREEN BREAST BILATERAL  Family history of breast cancer - Cancer genetic testing discussed. Pt to find out gene of sister, adn we can do testing. Since daughter had breast ca, pt is prob pos. TAHBSO protects breast.  - Plan: MM 3D SCREEN BREAST BILATERAL  Family history of gene mutation  MyRisk handout given.     Return if symptoms worsen or fail to improve.  Fareeda Downard B. Cay Kath, PA-C 08/12/2017 5:03 PM

## 2017-08-17 ENCOUNTER — Encounter: Payer: Self-pay | Admitting: Internal Medicine

## 2017-08-28 ENCOUNTER — Ambulatory Visit
Admission: RE | Admit: 2017-08-28 | Discharge: 2017-08-28 | Disposition: A | Discharge status: Home / Self Care | Payer: Medicare HMO | Source: Ambulatory Visit | Attending: Obstetrics and Gynecology | Admitting: Obstetrics and Gynecology

## 2017-08-28 DIAGNOSIS — Z1231 Encounter for screening mammogram for malignant neoplasm of breast: Secondary | ICD-10-CM | POA: Insufficient documentation

## 2017-08-28 DIAGNOSIS — Z803 Family history of malignant neoplasm of breast: Secondary | ICD-10-CM

## 2017-08-28 DIAGNOSIS — Z1239 Encounter for other screening for malignant neoplasm of breast: Secondary | ICD-10-CM

## 2017-08-31 ENCOUNTER — Encounter: Payer: Self-pay | Admitting: Obstetrics and Gynecology

## 2017-09-01 ENCOUNTER — Ambulatory Visit (INDEPENDENT_AMBULATORY_CARE_PROVIDER_SITE_OTHER): Payer: Medicare HMO | Admitting: Internal Medicine

## 2017-09-01 ENCOUNTER — Encounter: Payer: Self-pay | Admitting: Internal Medicine

## 2017-09-01 VITALS — BP 110/62 | HR 62 | Temp 97.9°F | Ht 62.0 in | Wt 152.0 lb

## 2017-09-01 DIAGNOSIS — M62838 Other muscle spasm: Secondary | ICD-10-CM | POA: Diagnosis not present

## 2017-09-01 NOTE — Progress Notes (Signed)
Subjective:    Patient ID: Sheryl Miller, female    DOB: 07-16-1935, 82 y.o.   MRN: [id number]  HPI Here due to trouble with her hands  This goes back a long time Happens at different times Fingers will spasm---not in bed, no clear relationship with activity Left 3rd is the worst---will pull to the side for 3-5 minutes (painful) Gets cramp in palm but no mass there Sometimes on right also---different fingers  Current Outpatient Medications on File Prior to Visit  Medication Sig Dispense Refill  . Ascorbic Acid (VITAMIN C) 100 MG tablet Take 100 mg by mouth daily.    Marland Kitchen aspirin EC 81 MG tablet Take 81 mg by mouth daily.    . COMBIGAN 0.2-0.5 % ophthalmic solution Place 1 drop into both eyes every 12 (twelve) hours. Reported on 03/05/2015    . Cyanocobalamin (B-12) 5000 MCG SUBL Place under the tongue.    Marland Kitchen ipratropium (ATROVENT) 0.03 % nasal spray Place 2 sprays into both nostrils every 12 (twelve) hours. 30 mL 12  . Multiple Vitamins-Minerals (CENTRUM SILVER ULTRA WOMENS) TABS Take 1 tablet by mouth daily.     . simvastatin (ZOCOR) 40 MG tablet TAKE 1 TABLET AT BEDTIME 90 tablet 0  . traZODone (DESYREL) 150 MG tablet TAKE 1 TABLET AT BEDTIME AS NEEDED  FOR  SLEEP 90 tablet 3  . TURMERIC PO Take by mouth.     No current facility-administered medications on file prior to visit.     No Known Allergies  Past Medical History:  Diagnosis Date  . Allergic rhinitis, cause unspecified   . Breast cyst   . Diverticulosis of colon (without mention of hemorrhage)   . Esophageal reflux   . Female stress incontinence   . Internal hemorrhoids without mention of complication   . Osteoporosis, unspecified   . Other and unspecified hyperlipidemia   . Raynaud's syndrome   . Rheumatic fever   . Unspecified constipation     Past Surgical History:  Procedure Laterality Date  . ABDOMINAL HYSTERECTOMY    . BLADDER REPAIR    . BREAST BIOPSY Right 2000   fibrocystic disease  . KNEE  ARTHROSCOPY Right ~5/16  . SHOULDER ARTHROSCOPY WITH ROTATOR CUFF REPAIR AND SUBACROMIAL DECOMPRESSION  08/2001   R, Alphonzo Severance  . SHOULDER SURGERY      In 2006, bilaterally shoulder surgery for rotator cuff.  . TONSILLECTOMY    . TUBAL LIGATION      Family History  Problem Relation Age of Onset  . Coronary artery disease Mother   . Heart disease Mother   . Diabetes Mother   . COPD Father   . Breast cancer Sister        2 (age 55?, age 52?); "Gene pos"  . Diabetes Brother   . Breast cancer Maternal Aunt        2  . Stroke Paternal Grandmother   . Diabetes Brother   . Breast cancer Sister   . Breast cancer Daughter        ? at age 38    Social History   Socioeconomic History  . Marital status: Married    Spouse name: Not on file  . Number of children: 4  . Years of education: Not on file  . Highest education level: Not on file  Occupational History  . Occupation: retired Scientist, clinical (histocompatibility and immunogenetics): RETIRED  Social Needs  . Financial resource strain: Not on file  . Food insecurity:  Worry: Not on file    Inability: Not on file  . Transportation needs:    Medical: Not on file    Non-medical: Not on file  Tobacco Use  . Smoking status: Never Smoker  . Smokeless tobacco: Never Used  Substance and Sexual Activity  . Alcohol use: No    Alcohol/week: 0.0 oz  . Drug use: No  . Sexual activity: Yes    Birth control/protection: Post-menopausal  Lifestyle  . Physical activity:    Days per week: Not on file    Minutes per session: Not on file  . Stress: Not on file  Relationships  . Social connections:    Talks on phone: Not on file    Gets together: Not on file    Attends religious service: Not on file    Active member of club or organization: Not on file    Attends meetings of clubs or organizations: Not on file    Relationship status: Not on file  . Intimate partner violence:    Fear of current or ex partner: Not on file    Emotionally abused: Not on file     Physically abused: Not on file    Forced sexual activity: Not on file  Other Topics Concern  . Not on file  Social History Narrative   Regular exercise-yes, walking or rides stationery bike daily      Has living will.    Husband should make health care decisions as needed, then son Herbie [place].    Would accept resuscitation but no prolonged ventilation.    Would not want feeding tube for extended time if not cognitively aware            Review of Systems Some leg cramps at night--seems to be different Tries to keep up with fluid intake    Objective:   Physical Exam  Musculoskeletal:  Normal flexion and extension of all fingers No cords or tendon abnormalities palpable Normal muscle bulk and thenar eminences  Neurological:  Normal grip strength and hand strength           Assessment & Plan:

## 2017-09-01 NOTE — Assessment & Plan Note (Signed)
Not sure if this could be related to interosseous muscles or related to tendons (does seem to pull to side more than flexion) Some leg cramps but not clearly related No clear electrolyte problems Not CTS or clear tendon abnormalities Will set up with neurologist to evaluate

## 2017-10-05 DIAGNOSIS — H401132 Primary open-angle glaucoma, bilateral, moderate stage: Secondary | ICD-10-CM | POA: Diagnosis not present

## 2017-10-06 DIAGNOSIS — M62838 Other muscle spasm: Secondary | ICD-10-CM | POA: Diagnosis not present

## 2017-10-09 ENCOUNTER — Other Ambulatory Visit: Payer: Self-pay | Admitting: Internal Medicine

## 2017-12-15 DIAGNOSIS — Z1283 Encounter for screening for malignant neoplasm of skin: Secondary | ICD-10-CM | POA: Diagnosis not present

## 2017-12-15 DIAGNOSIS — I8393 Asymptomatic varicose veins of bilateral lower extremities: Secondary | ICD-10-CM | POA: Diagnosis not present

## 2017-12-15 DIAGNOSIS — I8310 Varicose veins of unspecified lower extremity with inflammation: Secondary | ICD-10-CM | POA: Diagnosis not present

## 2017-12-15 DIAGNOSIS — L821 Other seborrheic keratosis: Secondary | ICD-10-CM | POA: Diagnosis not present

## 2017-12-15 DIAGNOSIS — L219 Seborrheic dermatitis, unspecified: Secondary | ICD-10-CM | POA: Diagnosis not present

## 2017-12-15 DIAGNOSIS — L820 Inflamed seborrheic keratosis: Secondary | ICD-10-CM | POA: Diagnosis not present

## 2017-12-15 DIAGNOSIS — D1800 Hemangioma unspecified site: Secondary | ICD-10-CM | POA: Diagnosis not present

## 2017-12-15 DIAGNOSIS — L812 Freckles: Secondary | ICD-10-CM | POA: Diagnosis not present

## 2017-12-15 DIAGNOSIS — I788 Other diseases of capillaries: Secondary | ICD-10-CM | POA: Diagnosis not present

## 2018-04-02 DIAGNOSIS — H401132 Primary open-angle glaucoma, bilateral, moderate stage: Secondary | ICD-10-CM | POA: Diagnosis not present

## 2018-04-26 ENCOUNTER — Other Ambulatory Visit: Payer: Self-pay | Admitting: Internal Medicine

## 2018-06-12 IMAGING — MG MM DIGITAL DIAGNOSTIC UNILAT*R* W/ TOMO W/ CAD
6 series · 6 of 14 positions shown · non-contrast
Comparison: Previous exam(s).

CLINICAL DATA: Patient recalled from screening for right breast
mass.

EXAM:
DIGITAL DIAGNOSTIC RIGHT MAMMOGRAM WITH CAD
ULTRASOUND RIGHT BREAST

[R CC synth-2D]
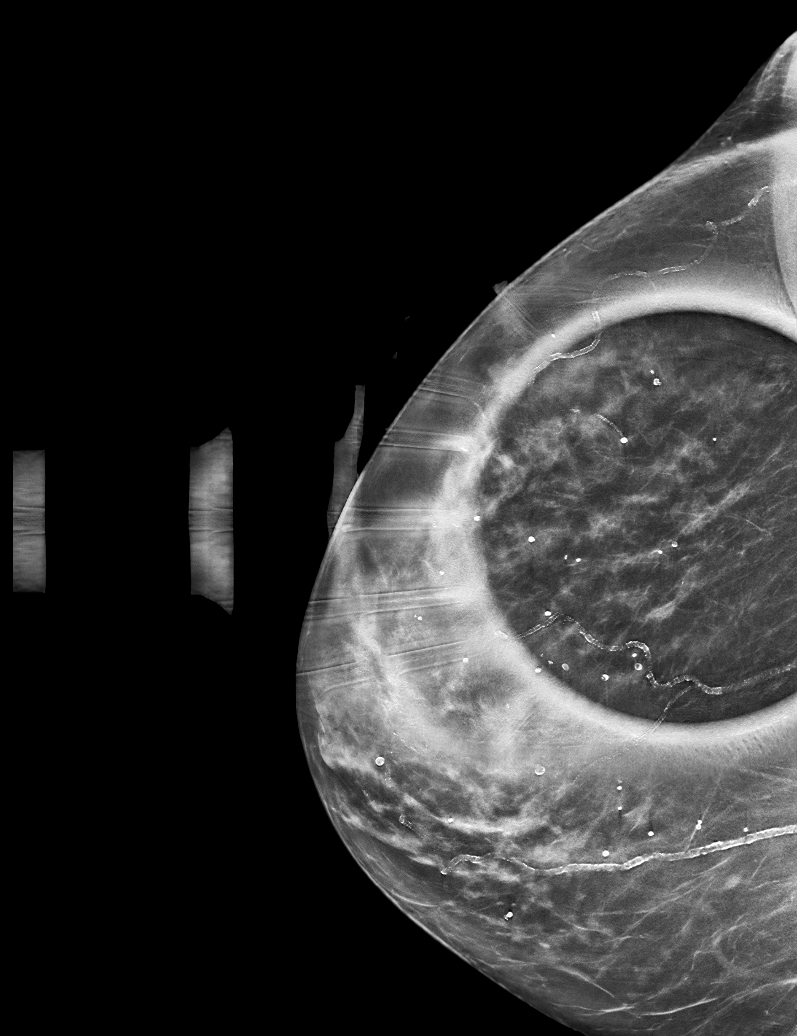

[R MLO]
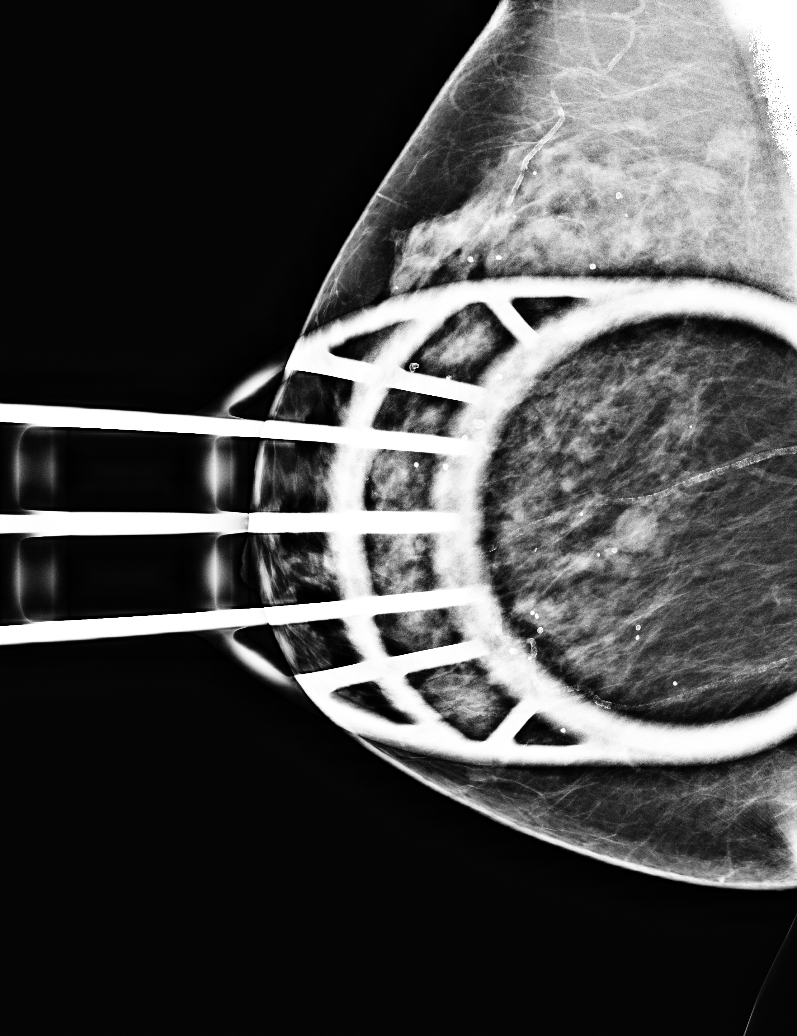

[R CC]
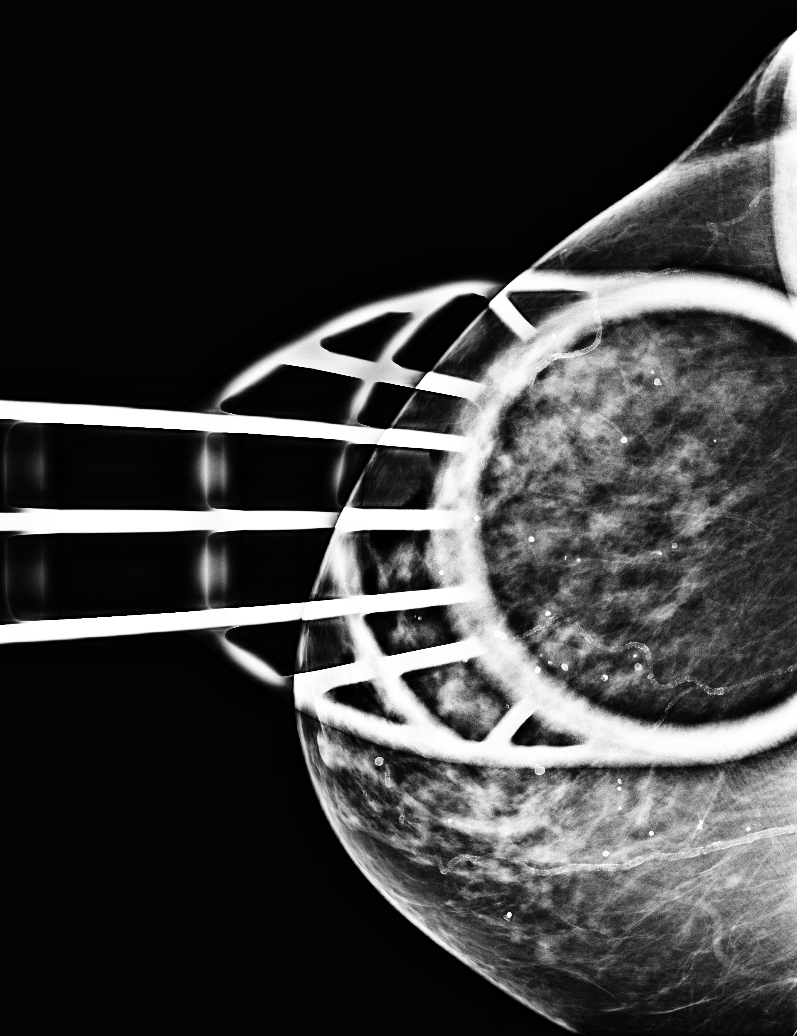

[R MLO synth-2D]
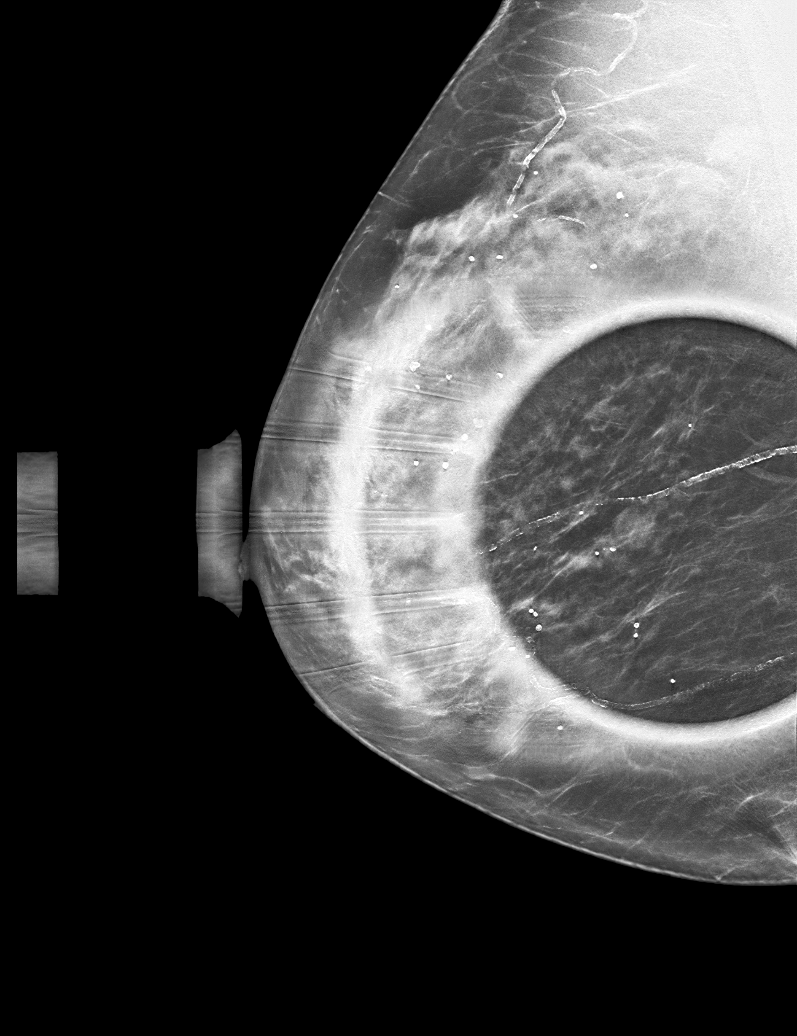

[R MLO tomo · tomo slice 29/58.0]
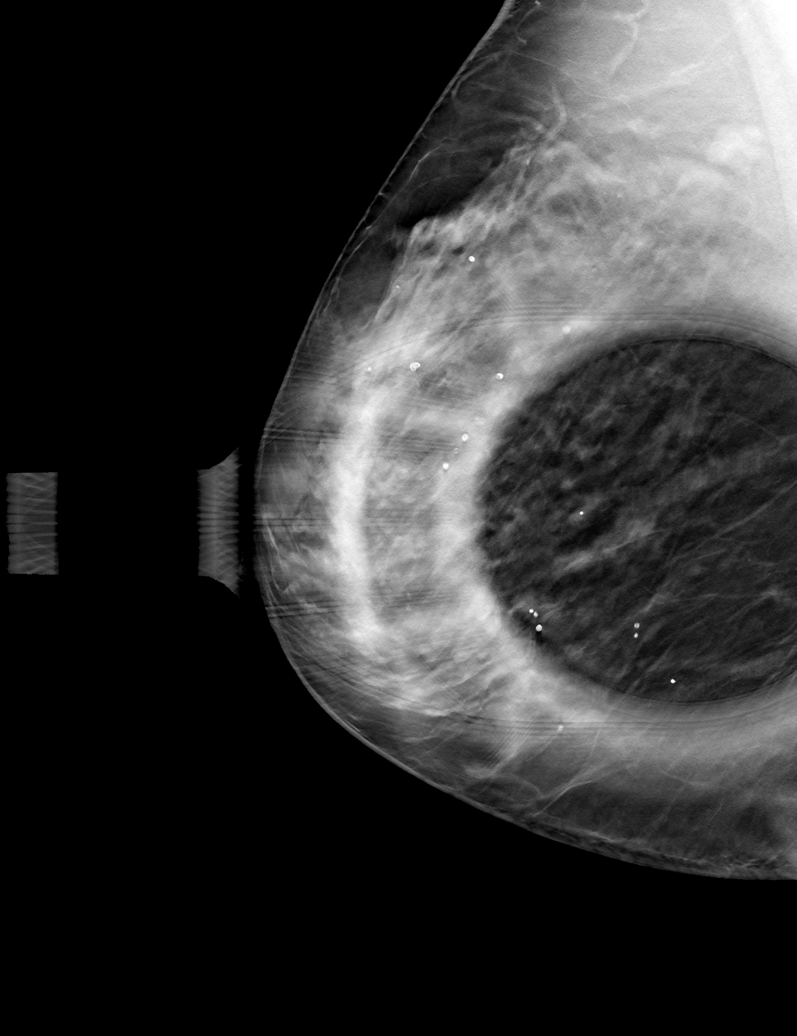

[R CC tomo · tomo slice 34/67.0]
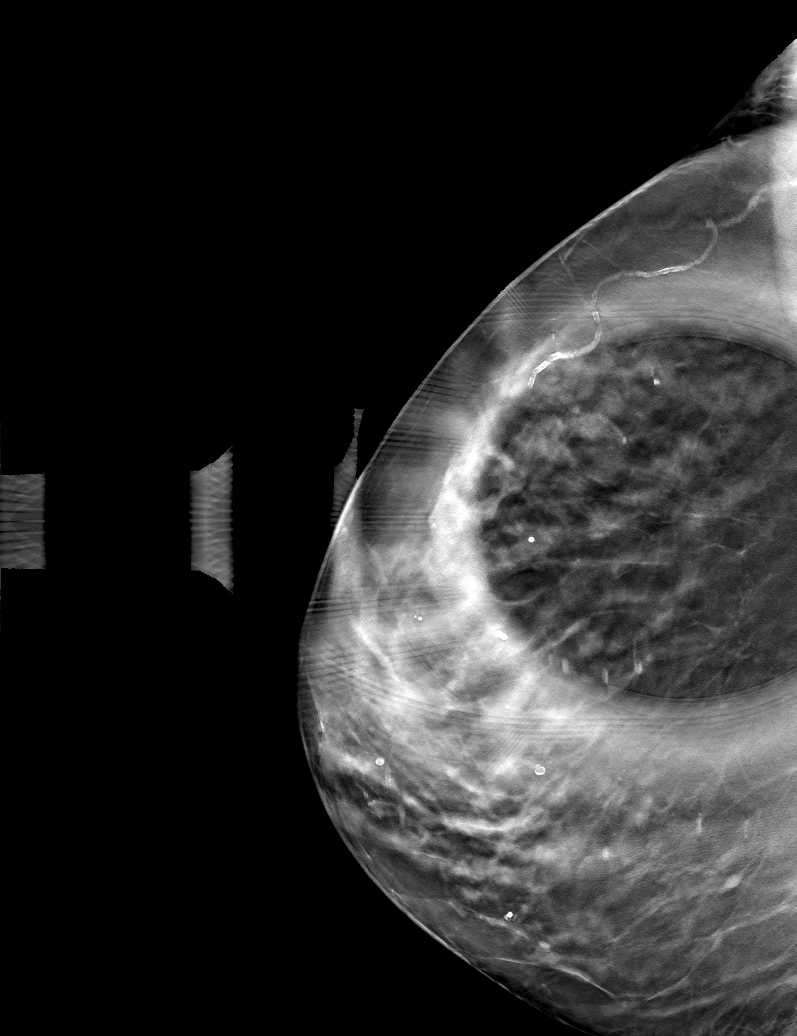

[6 of 14 positions shown; findings below may reference images not displayed]

ACR Breast Density Category c: The breast tissue is heterogeneously
dense, which may obscure small masses.
FINDINGS: There is an oval circumscribed mass within the lower outer right
breast middle to posterior depth.

Mammographic images were processed with CAD.

On physical exam, I palpate no discrete mass in the lower outer
right breast.

Targeted ultrasound is performed, showing a 7 x 8 x 8 mm simple cyst
right breast 8 o'clock position 6 cm from nipple.
IMPRESSION: Right breast simple cyst.

No mammographic evidence for malignancy.

RECOMMENDATION:
Screening mammogram in one year.(Code:[id number]-596)

I have discussed the findings and recommendations with the patient.
Results were also provided in writing at the conclusion of the
visit. If applicable, a reminder letter will be sent to the patient
regarding the next appointment.

BI-RADS CATEGORY  2: Benign.

## 2018-07-13 ENCOUNTER — Encounter: Payer: Self-pay | Admitting: Internal Medicine

## 2018-07-13 ENCOUNTER — Ambulatory Visit (INDEPENDENT_AMBULATORY_CARE_PROVIDER_SITE_OTHER): Payer: Medicare HMO | Admitting: Internal Medicine

## 2018-07-13 DIAGNOSIS — Z0000 Encounter for general adult medical examination without abnormal findings: Secondary | ICD-10-CM | POA: Diagnosis not present

## 2018-07-13 DIAGNOSIS — K219 Gastro-esophageal reflux disease without esophagitis: Secondary | ICD-10-CM

## 2018-07-13 DIAGNOSIS — Z7189 Other specified counseling: Secondary | ICD-10-CM | POA: Diagnosis not present

## 2018-07-13 DIAGNOSIS — E785 Hyperlipidemia, unspecified: Secondary | ICD-10-CM | POA: Diagnosis not present

## 2018-07-13 DIAGNOSIS — G479 Sleep disorder, unspecified: Secondary | ICD-10-CM | POA: Diagnosis not present

## 2018-07-13 NOTE — Assessment & Plan Note (Signed)
Symptoms are basically gone now

## 2018-07-13 NOTE — Assessment & Plan Note (Signed)
See social history 

## 2018-07-13 NOTE — Patient Instructions (Signed)
Please stop the simvastatin for the next month to see if it helps your leg soreness.

## 2018-07-13 NOTE — Progress Notes (Signed)
Subjective:    Patient ID: Sheryl Miller, female    DOB: 05/12/1935, 83 y.o.   MRN: [id number]  HPI Virtual visit for Medicare wellness and follow up of chronic health conditions Identification done Reviewed billing and she gave consent She is in her home and I am in my office  Her only problem is having chapped lips Uses lipstick and sometimes vaseline Long standing No true chelitis Does take multivitamin  Still on the statin Does note some sensitive muscles---legs mostly No GI problems  Tends to lose her balance Did fall recently--no injury (while cutting shrubs) Has had 3 falls before this---all with no injury. Once going down back steps---hit part of last step Has normal sensation in feet No regular exercise---just occasional walking No weakness---but legs are sore to touch  Still not sleeping well--despite the trazodone Has also tried sleepy time tea Initiates fine----but has middle of the night awakening. No daytime somnolence  No hospitalizations or surgery in the past year No regular exercise--- discussed Other doctors--- Dr Despina Pole, Dr Anthonette Legato, Dr Thalia Party (not in a long time), Dr Louann Sjogren No tobacco Still with occasional glass of wine Vision and hearing are fine Some falls No depression or anhedonia Independent with instrumental ADLs No memory problems  Current Outpatient Medications on File Prior to Visit  Medication Sig Dispense Refill  . gabapentin (NEURONTIN) 100 MG capsule Take 100 mg by mouth 2 (two) times daily as needed.    Marland Kitchen ipratropium (ATROVENT) 0.03 % nasal spray Place 2 sprays into both nostrils every 12 (twelve) hours. 30 mL 12  . mometasone (ELOCON) 0.1 % lotion Apply 1 application topically daily.    . Multiple Vitamins-Minerals (CENTRUM SILVER ULTRA WOMENS) TABS Take 1 tablet by mouth daily.     . simvastatin (ZOCOR) 40 MG tablet TAKE 1 TABLET AT BEDTIME 90 tablet 3  . traZODone (DESYREL) 150 MG tablet  TAKE 1 TABLET AT BEDTIME AS NEEDED  FOR  SLEEP 90 tablet 3  . TURMERIC PO Take by mouth.     No current facility-administered medications on file prior to visit.     No Known Allergies  Past Medical History:  Diagnosis Date  . Allergic rhinitis, cause unspecified   . Breast cyst   . Diverticulosis of colon (without mention of hemorrhage)   . Esophageal reflux   . Female stress incontinence   . Internal hemorrhoids without mention of complication   . Osteoporosis, unspecified   . Other and unspecified hyperlipidemia   . Raynaud's syndrome   . Rheumatic fever   . Unspecified constipation     Past Surgical History:  Procedure Laterality Date  . ABDOMINAL HYSTERECTOMY    . BLADDER REPAIR    . BREAST BIOPSY Right 2000   fibrocystic disease  . KNEE ARTHROSCOPY Right ~5/16  . SHOULDER ARTHROSCOPY WITH ROTATOR CUFF REPAIR AND SUBACROMIAL DECOMPRESSION  08/2001   R, Alphonzo Severance  . SHOULDER SURGERY      In 2006, bilaterally shoulder surgery for rotator cuff.  . TONSILLECTOMY    . TUBAL LIGATION      Family History  Problem Relation Age of Onset  . Coronary artery disease Mother   . Heart disease Mother   . Diabetes Mother   . COPD Father   . Breast cancer Sister        2 (age 55?, age 12?); "Gene pos"  . Diabetes Brother   . Breast cancer Maternal Aunt        2  .  Stroke Paternal Grandmother   . Diabetes Brother   . Breast cancer Sister   . Breast cancer Daughter        ? at age 12    Social History   Socioeconomic History  . Marital status: Married    Spouse name: Not on file  . Number of children: 4  . Years of education: Not on file  . Highest education level: Not on file  Occupational History  . Occupation: retired Scientist, clinical (histocompatibility and immunogenetics): RETIRED  Social Needs  . Financial resource strain: Not on file  . Food insecurity:    Worry: Not on file    Inability: Not on file  . Transportation needs:    Medical: Not on file    Non-medical: Not on file  Tobacco  Use  . Smoking status: Never Smoker  . Smokeless tobacco: Never Used  Substance and Sexual Activity  . Alcohol use: No    Alcohol/week: 0.0 standard drinks  . Drug use: No  . Sexual activity: Yes    Birth control/protection: Post-menopausal  Lifestyle  . Physical activity:    Days per week: Not on file    Minutes per session: Not on file  . Stress: Not on file  Relationships  . Social connections:    Talks on phone: Not on file    Gets together: Not on file    Attends religious service: Not on file    Active member of club or organization: Not on file    Attends meetings of clubs or organizations: Not on file    Relationship status: Not on file  . Intimate partner violence:    Fear of current or ex partner: Not on file    Emotionally abused: Not on file    Physically abused: Not on file    Forced sexual activity: Not on file  Other Topics Concern  . Not on file  Social History Narrative   Regular exercise-yes, walking or rides stationery bike daily      Has living will.    Husband should make health care decisions as needed, then son Herbie [place].    Would accept resuscitation but no prolonged ventilation.    Would not want feeding tube for extended time if not cognitively aware            Review of Systems  No significant joint pains---other than her hands. Gets some cramps and is better if she increases her water intake. Gabapentin helps (got this from Dr Melrose Nakayama). Just in right hand, not left Appetite is fine Weight is stable Bowels are generally okay--no blood Voids okay. Some nocturia. No incontinence No heartburn or dysphagia No chest pain or SOB Does get some palpitations if she pushes it while walking No dizziness or syncope No edema     Objective:   Physical Exam  Constitutional: She is oriented to person, place, and time. She appears well-developed. No distress.  Respiratory: Effort normal. No respiratory distress.  Musculoskeletal:        General: No  edema.  Neurological: She is alert and oriented to person, place, and time.  President--- "Trump, the black one, ?" [id number]? [id number] Recall 3/3  Psychiatric: She has a normal mood and affect. Her behavior is normal.           Assessment & Plan:

## 2018-07-13 NOTE — Progress Notes (Signed)
Hearing Screening Comments: No testing in last 12 months Vision Screening Comments: February 2020

## 2018-07-13 NOTE — Assessment & Plan Note (Signed)
Chronic problems despite trazodone Discussed sleep hygiene Not tired during the day

## 2018-07-13 NOTE — Assessment & Plan Note (Signed)
I have personally reviewed the Medicare Annual Wellness questionnaire and have noted 1. The patient's medical and social history 2. Their use of alcohol, tobacco or illicit drugs 3. Their current medications and supplements 4. The patient's functional ability including ADL's, fall risks, home safety risks and hearing or visual             impairment. 5. Diet and physical activities 6. Evidence for depression or mood disorders  The patients weight, height, BMI and visual acuity have been recorded in the chart I have made referrals, counseling and provided education to the patient based review of the above and I have provided the pt with a written personalized care plan for preventive services.  I have provided you with a copy of your personalized plan for preventive services. Please take the time to review along with your updated medication list.  Yearly flu vaccine Discussed mammograms---not clearly indicated at her age but she has chosen to continue Done with colon cancer screening--testing only for symptoms Multiple falls---discussed leg strengthening, picking up feet more, etc Needs to exercise more

## 2018-07-13 NOTE — Assessment & Plan Note (Signed)
Still using statin for primary prevention Notes he legs are sore----asked her to hold the statin for the next month to see if that helps

## 2018-08-12 DIAGNOSIS — E785 Hyperlipidemia, unspecified: Secondary | ICD-10-CM

## 2018-08-12 MED ORDER — ESZOPICLONE 1 MG PO TABS: 1.0000 mg | ORAL_TABLET | Freq: Every evening | ORAL | 0 refills | Status: DC | PRN

## 2018-08-12 NOTE — Telephone Encounter (Signed)
Please set her up for fasting blood work---in the next week or so

## 2018-08-17 ENCOUNTER — Other Ambulatory Visit (INDEPENDENT_AMBULATORY_CARE_PROVIDER_SITE_OTHER): Payer: Medicare HMO

## 2018-08-17 DIAGNOSIS — E785 Hyperlipidemia, unspecified: Secondary | ICD-10-CM

## 2018-08-17 LAB — CBC
HCT: 37.6 % (ref 36.0–46.0)
Hemoglobin: 12.7 g/dL (ref 12.0–15.0)
MCHC: 33.7 g/dL (ref 30.0–36.0)
MCV: 89.5 fl (ref 78.0–100.0)
Platelets: 228 10*3/uL (ref 150.0–400.0)
RBC: 4.2 Mil/uL (ref 3.87–5.11)
RDW: 13.5 % (ref 11.5–15.5)
WBC: 4.1 10*3/uL (ref 4.0–10.5)

## 2018-08-17 LAB — LIPID PANEL
Cholesterol: 237 mg/dL — ABNORMAL HIGH (ref 0–200)
HDL: 66 mg/dL (ref >39.00)
LDL Cholesterol: 158 mg/dL — ABNORMAL HIGH (ref 0–99)
NonHDL: 171.38
Total CHOL/HDL Ratio: 4
Triglycerides: 65 mg/dL (ref 0.0–149.0)
VLDL: 13 mg/dL (ref 0.0–40.0)

## 2018-08-17 LAB — COMPREHENSIVE METABOLIC PANEL
ALT: 12 U/L (ref 0–35)
AST: 19 U/L (ref 0–37)
Albumin: 3.9 g/dL (ref 3.5–5.2)
Alkaline Phosphatase: 61 U/L (ref 39–117)
BUN: 9 mg/dL (ref 6–23)
CO2: 29 mEq/L (ref 19–32)
Calcium: 9 mg/dL (ref 8.4–10.5)
Chloride: 99 mEq/L (ref 96–112)
Creatinine, Ser: 0.65 mg/dL (ref 0.40–1.20)
GFR: 87.47 mL/min (ref >60.00)
Glucose, Bld: 99 mg/dL (ref 70–99)
Potassium: 4.9 mEq/L (ref 3.5–5.1)
Sodium: 133 mEq/L — ABNORMAL LOW (ref 135–145)
Total Bilirubin: 0.7 mg/dL (ref 0.2–1.2)
Total Protein: 6.1 g/dL (ref 6.0–8.3)

## 2018-09-20 ENCOUNTER — Telehealth: Payer: Self-pay | Admitting: Internal Medicine

## 2018-09-20 MED ORDER — TRAZODONE HCL 150 MG PO TABS: 150.0000 mg | ORAL_TABLET | Freq: Every evening | ORAL | 0 refills | Status: DC | PRN

## 2018-09-20 NOTE — Telephone Encounter (Signed)
Spoke to pt to see how many she needed. Advised her that she would have to pay out of pocket for them. [facility] has it for $4. She decided to have me send it to the [facility]. I have done that.

## 2018-09-20 NOTE — Telephone Encounter (Signed)
Best number [phone] Pt called stating she is on vacation for 3 weeks and forgot  her trazodone  Can you see rx to  [address]

## 2018-10-15 DIAGNOSIS — H43813 Vitreous degeneration, bilateral: Secondary | ICD-10-CM | POA: Diagnosis not present

## 2018-10-29 ENCOUNTER — Ambulatory Visit (INDEPENDENT_AMBULATORY_CARE_PROVIDER_SITE_OTHER)
Admission: RE | Admit: 2018-10-29 | Discharge: 2018-10-29 | Disposition: A | Discharge status: Home / Self Care | Payer: Medicare HMO | Source: Ambulatory Visit | Attending: Internal Medicine | Admitting: Internal Medicine

## 2018-10-29 ENCOUNTER — Other Ambulatory Visit: Payer: Self-pay

## 2018-10-29 ENCOUNTER — Ambulatory Visit (INDEPENDENT_AMBULATORY_CARE_PROVIDER_SITE_OTHER): Payer: Medicare HMO | Admitting: Internal Medicine

## 2018-10-29 ENCOUNTER — Encounter: Payer: Self-pay | Admitting: Internal Medicine

## 2018-10-29 DIAGNOSIS — M25531 Pain in right wrist: Secondary | ICD-10-CM | POA: Diagnosis not present

## 2018-10-29 DIAGNOSIS — G629 Polyneuropathy, unspecified: Secondary | ICD-10-CM | POA: Insufficient documentation

## 2018-10-29 NOTE — Progress Notes (Signed)
Subjective:    Patient ID: Sheryl Miller, female    DOB: [birth year], 83 y.o.   MRN: [id number]  HPI Here due to right wrist pain Also with pain in thumb  [place] in July Foot caught on chair (stuck with Biomedical scientist) Doesn't remember how she fell----but likely on that hand Stayed on floor for a while-- everyone suggested that Able to get up on her own--doesn't remember any pain then  Started hurting a few days later Over wrist Can have severe pain in dorsum at base of thumb if she moves it wrong Tried brace---didn't really help No medications No sig swelling  Current Outpatient Medications on File Prior to Visit  Medication Sig Dispense Refill  . gabapentin (NEURONTIN) 100 MG capsule Take 100 mg by mouth 2 (two) times daily as needed.    . Multiple Vitamins-Minerals (CENTRUM SILVER ULTRA WOMENS) TABS Take 1 tablet by mouth daily.     . simvastatin (ZOCOR) 40 MG tablet TAKE 1 TABLET AT BEDTIME 90 tablet 3  . traZODone (DESYREL) 150 MG tablet Take 1 tablet (150 mg total) by mouth at bedtime as needed. for sleep 30 tablet 0  . TURMERIC PO Take by mouth.     No current facility-administered medications on file prior to visit.     No Known Allergies  Past Medical History:  Diagnosis Date  . Allergic rhinitis, cause unspecified   . Breast cyst   . Diverticulosis of colon (without mention of hemorrhage)   . Esophageal reflux   . Female stress incontinence   . Internal hemorrhoids without mention of complication   . Osteoporosis, unspecified   . Other and unspecified hyperlipidemia   . Raynaud's syndrome   . Rheumatic fever   . Unspecified constipation     Past Surgical History:  Procedure Laterality Date  . ABDOMINAL HYSTERECTOMY    . BLADDER REPAIR    . BREAST BIOPSY Right 2000   fibrocystic disease  . KNEE ARTHROSCOPY Right ~5/16  . SHOULDER ARTHROSCOPY WITH ROTATOR CUFF REPAIR AND SUBACROMIAL DECOMPRESSION  08/2001   R, Alphonzo Severance  . SHOULDER SURGERY      In  2006, bilaterally shoulder surgery for rotator cuff.  . TONSILLECTOMY    . TUBAL LIGATION      Family History  Problem Relation Age of Onset  . Coronary artery disease Mother   . Heart disease Mother   . Diabetes Mother   . COPD Father   . Breast cancer Sister        2 (age 40?, age 44?); "Gene pos"  . Diabetes Brother   . Breast cancer Maternal Aunt        2  . Stroke Paternal Grandmother   . Diabetes Brother   . Breast cancer Sister   . Breast cancer Daughter        ? at age 89    Social History   Socioeconomic History  . Marital status: Married    Spouse name: Not on file  . Number of children: 4  . Years of education: Not on file  . Highest education level: Not on file  Occupational History  . Occupation: retired Scientist, clinical (histocompatibility and immunogenetics): RETIRED  Social Needs  . Financial resource strain: Not on file  . Food insecurity    Worry: Not on file    Inability: Not on file  . Transportation needs    Medical: Not on file    Non-medical: Not on file  Tobacco Use  .  Smoking status: Never Smoker  . Smokeless tobacco: Never Used  Substance and Sexual Activity  . Alcohol use: No    Alcohol/week: 0.0 standard drinks  . Drug use: No  . Sexual activity: Yes    Birth control/protection: Post-menopausal  Lifestyle  . Physical activity    Days per week: Not on file    Minutes per session: Not on file  . Stress: Not on file  Relationships  . Social Herbalist on phone: Not on file    Gets together: Not on file    Attends religious service: Not on file    Active member of club or organization: Not on file    Attends meetings of clubs or organizations: Not on file    Relationship status: Not on file  . Intimate partner violence    Fear of current or ex partner: Not on file    Emotionally abused: Not on file    Physically abused: Not on file    Forced sexual activity: Not on file  Other Topics Concern  . Not on file  Social History Narrative   Regular  exercise-yes, walking or rides stationery bike daily      Has living will.    Husband should make health care decisions as needed, then son Herbie [place].    Would accept resuscitation but no prolonged ventilation.    Would not want feeding tube for extended time if not cognitively aware            Review of Systems  Is right handed--continues to use it for all activities No fever No other joint swelling     Objective:   Physical Exam  Constitutional: She appears well-developed. No distress.  Musculoskeletal:     Comments: No wrist or hand swelling Tenderness over radial right wrist and CMC Normal ROM  Neurological:  Normal grip strength           Assessment & Plan:

## 2018-10-29 NOTE — Assessment & Plan Note (Addendum)
I suspect tendonitis Will check x-ray to be sure X-ray is reassuring Discussed ice, etc

## 2018-11-29 ENCOUNTER — Telehealth: Payer: Self-pay | Admitting: Internal Medicine

## 2018-11-29 DIAGNOSIS — M79643 Pain in unspecified hand: Secondary | ICD-10-CM

## 2018-11-29 NOTE — Telephone Encounter (Signed)
Left message on VM asking pt to call back. There was no DPR on file that I could find.

## 2018-11-29 NOTE — Telephone Encounter (Signed)
Patient called asking for a referral to Dr.Thompson for thumb  and wrist.  Dr.Thompson's phone number is [phone].  Patient said she can go anytime.

## 2018-11-29 NOTE — Telephone Encounter (Signed)
Let her know I put in the referral but it may be a few days before she hears about the appointment (and I am not sure how long it will take to get in)

## 2018-11-30 ENCOUNTER — Ambulatory Visit (INDEPENDENT_AMBULATORY_CARE_PROVIDER_SITE_OTHER): Payer: Medicare HMO

## 2018-11-30 DIAGNOSIS — Z23 Encounter for immunization: Secondary | ICD-10-CM | POA: Diagnosis not present

## 2018-11-30 NOTE — Telephone Encounter (Signed)
[facility] - Client Nonclinical Telephone Record [facility] Client [facility] - Client Client [facility] Physician Viviana Simpler - MD Contact Type Call Who Is Calling Patient / Member / Family / Caregiver Caller Name Sheryl Miller Phone Number [phone] Call Type Message Only Information Provided Reason for Call Returning a Call from the Office Initial Comment The caller is returning a call from the office. Additional Comment Call Closed By: Carlis Stable Transaction Date/Time: 11/29/2018 5:20:27 PM (ET)

## 2018-11-30 NOTE — Telephone Encounter (Signed)
Spoke to pt. Advised her the referral was sent to be scheduled.

## 2018-12-14 DIAGNOSIS — M654 Radial styloid tenosynovitis [de Quervain]: Secondary | ICD-10-CM | POA: Diagnosis not present

## 2018-12-22 ENCOUNTER — Other Ambulatory Visit: Payer: Self-pay | Admitting: Internal Medicine

## 2018-12-22 NOTE — Telephone Encounter (Signed)
Patient would like this medication sent to Sheryl Miller but she is also requesting at least 25 tablets sent to a local pharmacy so she will have enough until her medication arrives     [facility]

## 2018-12-27 DIAGNOSIS — L820 Inflamed seborrheic keratosis: Secondary | ICD-10-CM | POA: Diagnosis not present

## 2018-12-27 DIAGNOSIS — D3617 Benign neoplasm of peripheral nerves and autonomic nervous system of trunk, unspecified: Secondary | ICD-10-CM | POA: Diagnosis not present

## 2018-12-27 DIAGNOSIS — Z1283 Encounter for screening for malignant neoplasm of skin: Secondary | ICD-10-CM | POA: Diagnosis not present

## 2018-12-27 DIAGNOSIS — L570 Actinic keratosis: Secondary | ICD-10-CM | POA: Diagnosis not present

## 2018-12-27 DIAGNOSIS — L821 Other seborrheic keratosis: Secondary | ICD-10-CM | POA: Diagnosis not present

## 2018-12-27 DIAGNOSIS — D485 Neoplasm of uncertain behavior of skin: Secondary | ICD-10-CM | POA: Diagnosis not present

## 2018-12-27 DIAGNOSIS — D1800 Hemangioma unspecified site: Secondary | ICD-10-CM | POA: Diagnosis not present

## 2018-12-27 MED ORDER — SIMVASTATIN 40 MG PO TABS: 40.0000 mg | ORAL_TABLET | Freq: Every day | ORAL | 0 refills | Status: DC

## 2018-12-27 NOTE — Telephone Encounter (Signed)
Chris from [facility] called to ck on status of med for simvastatin 40 mg sent to [facility] while pt is waiting on mail order delivery of simvastatin. In 12/22/18 note pt had requested # 25 simvastatin 40 mg sent to [facility]; done.

## 2018-12-27 NOTE — Addendum Note (Signed)
Addended by: Helene Shoe on: 12/27/2018 03:20 PM   Modules accepted: Orders

## 2019-01-03 ENCOUNTER — Other Ambulatory Visit: Payer: Self-pay | Admitting: Internal Medicine

## 2019-01-03 DIAGNOSIS — Z1231 Encounter for screening mammogram for malignant neoplasm of breast: Secondary | ICD-10-CM

## 2019-01-10 ENCOUNTER — Ambulatory Visit
Admission: RE | Admit: 2019-01-10 | Discharge: 2019-01-10 | Disposition: A | Discharge status: Home / Self Care | Payer: Medicare HMO | Source: Ambulatory Visit | Attending: Internal Medicine | Admitting: Internal Medicine

## 2019-01-10 DIAGNOSIS — Z1231 Encounter for screening mammogram for malignant neoplasm of breast: Secondary | ICD-10-CM | POA: Insufficient documentation

## 2019-01-19 NOTE — Progress Notes (Deleted)
PCP: Venia Carbon, MD   No chief complaint on file.   HPI:      [name] is a 83 y.o. G4P4 who LMP was No LMP recorded. Patient has had a hysterectomy., presents today for her annual examination.  Her menses are {norm/abn:715}, lasting {number:22536} days.  Dysmenorrhea {dysmen:716}. She {does:18564} have intermenstrual bleeding. She {does:18564} have vasomotor sx.   Sex activity: {sex active:315163}. She {does:18564} have vaginal dryness.  Last Pap: [id number]  Results were: {norm/abn:16707::"no abnormalities"} /neg HPV DNA.  Hx of STDs: {STD hx:14358}  Last mammogram: {date:304500300}  Results were: {norm/abn:13465} There is no FH of breast cancer. There is no FH of ovarian cancer. The patient {does:18564} do self-breast exams.  Colonoscopy: {hx:15363}  Repeat due after 10*** years.   Tobacco use: {tob:20664} Alcohol use: {Alcohol:11675} Exercise: {exercise:31265}  She {does:18564} get adequate calcium and Vitamin D in her diet.  Labs with PCP.   Patient Active Problem List   Diagnosis Date Noted  . Right wrist pain 10/29/2018  . Muscle spasm 09/01/2017  . Family history of gene mutation 08/12/2017  . Generalized osteoarthritis 07/10/2017  . Advance directive discussed with patient 02/13/2016  . Family history of breast cancer in first degree relative 07/20/2015  . Hyperlipemia 02/12/2015  . Sleep disturbance 04/20/2014  . Routine general medical examination at a health care facility 08/16/2010  . PES PLANUS 02/11/2010  . UNEQUAL LEG LENGTH 02/11/2010  . VARICOSE VEINS, LOWER EXTREMITIES 12/01/2007  . DIVERTICULOSIS, COLON 06/24/2007  . Osteoporosis 06/24/2007  . HEMORRHOIDS, INTERNAL 06/07/2007  . Chronic constipation 06/07/2007  . GERD 02/11/2007  . Allergic rhinitis due to pollen 07/09/2006    Past Surgical History:  Procedure Laterality Date  . ABDOMINAL HYSTERECTOMY    . BLADDER REPAIR    . BREAST BIOPSY Right 2000   fibrocystic disease  . KNEE ARTHROSCOPY Right ~5/16  . SHOULDER ARTHROSCOPY WITH ROTATOR CUFF REPAIR AND SUBACROMIAL DECOMPRESSION  08/2001   R, Alphonzo Severance  . SHOULDER SURGERY      In 2006, bilaterally shoulder surgery for rotator cuff.  . TONSILLECTOMY    . TUBAL LIGATION      Family History  Problem Relation Age of Onset  . Coronary artery disease Mother   . Heart disease Mother   . Diabetes Mother   . COPD Father   . Breast cancer Sister        2 (age 47?, age 83?); "Gene pos"  . Diabetes Brother   . Breast cancer Maternal Aunt        2  . Stroke Paternal Grandmother   . Diabetes Brother   . Breast cancer Sister   . Breast cancer Daughter        ? at age 88  . Breast cancer Maternal Aunt   . Breast cancer Niece   . Breast cancer Niece     Social History   Socioeconomic History  . Marital status: Married    Spouse name: Not on file  . Number of children: 4  . Years of education: Not on file  . Highest education level: Not on file  Occupational History  . Occupation: retired Scientist, clinical (histocompatibility and immunogenetics): RETIRED  Social Needs  . Financial resource strain: Not on file  . Food insecurity    Worry: Not on file    Inability: Not on file  . Transportation needs    Medical: Not on file    Non-medical: Not on file  Tobacco Use  .  Smoking status: Never Smoker  . Smokeless tobacco: Never Used  Substance and Sexual Activity  . Alcohol use: No    Alcohol/week: 0.0 standard drinks  . Drug use: No  . Sexual activity: Yes    Birth control/protection: Post-menopausal  Lifestyle  . Physical activity    Days per week: Not on file    Minutes per session: Not on file  . Stress: Not on file  Relationships  . Social Herbalist on phone: Not on file    Gets together: Not on file    Attends religious service: Not on file    Active member of club or organization: Not on file    Attends meetings of clubs or organizations: Not on file    Relationship status: Not on file   . Intimate partner violence    Fear of current or ex partner: Not on file    Emotionally abused: Not on file    Physically abused: Not on file    Forced sexual activity: Not on file  Other Topics Concern  . Not on file  Social History Narrative   Regular exercise-yes, walking or rides stationery bike daily      Has living will.    Husband should make health care decisions as needed, then son Herbie [place].    Would accept resuscitation but no prolonged ventilation.    Would not want feeding tube for extended time if not cognitively aware              Current Outpatient Medications:  .  gabapentin (NEURONTIN) 100 MG capsule, Take 100 mg by mouth 2 (two) times daily as needed., Disp: , Rfl:  .  Multiple Vitamins-Minerals (CENTRUM SILVER ULTRA WOMENS) TABS, Take 1 tablet by mouth daily. , Disp: , Rfl:  .  simvastatin (ZOCOR) 40 MG tablet, Take 1 tablet (40 mg total) by mouth at bedtime., Disp: 25 tablet, Rfl: 0 .  traZODone (DESYREL) 150 MG tablet, Take 1 tablet (150 mg total) by mouth at bedtime as needed. for sleep, Disp: 30 tablet, Rfl: 0 .  TURMERIC PO, Take by mouth., Disp: , Rfl:      ROS:  Review of Systems BREAST: No symptoms    Objective: There were no vitals taken for this visit.   OBGyn Exam  Results: No results found for this or any previous visit (from the past 24 hour(s)).  Assessment/Plan:  No diagnosis found.   No orders of the defined types were placed in this encounter.           GYN counsel {counseling:16159}    F/U  No follow-ups on file.  Kalaysia Demonbreun B. Kiasia Chou, PA-C 01/19/2019 6:59 PM

## 2019-01-20 ENCOUNTER — Ambulatory Visit: Payer: Medicare HMO | Admitting: Obstetrics and Gynecology

## 2019-01-31 ENCOUNTER — Other Ambulatory Visit: Payer: Self-pay | Admitting: Internal Medicine

## 2019-02-22 DIAGNOSIS — K130 Diseases of lips: Secondary | ICD-10-CM | POA: Diagnosis not present

## 2019-02-22 DIAGNOSIS — L820 Inflamed seborrheic keratosis: Secondary | ICD-10-CM | POA: Diagnosis not present

## 2019-02-22 DIAGNOSIS — L300 Nummular dermatitis: Secondary | ICD-10-CM | POA: Diagnosis not present

## 2019-02-22 DIAGNOSIS — L570 Actinic keratosis: Secondary | ICD-10-CM | POA: Diagnosis not present

## 2019-04-12 DIAGNOSIS — M791 Myalgia, unspecified site: Secondary | ICD-10-CM

## 2019-04-12 DIAGNOSIS — M654 Radial styloid tenosynovitis [de Quervain]: Secondary | ICD-10-CM | POA: Diagnosis not present

## 2019-04-13 DIAGNOSIS — H401132 Primary open-angle glaucoma, bilateral, moderate stage: Secondary | ICD-10-CM | POA: Diagnosis not present

## 2019-04-14 NOTE — Telephone Encounter (Signed)
Please set her up for blood work in the next week or so

## 2019-04-20 ENCOUNTER — Telehealth: Payer: Self-pay | Admitting: Internal Medicine

## 2019-04-20 NOTE — Telephone Encounter (Signed)
Patient called to report that she had the moderna covid vaccine doses done  1st- 03/23/19 2nd-04/19/19

## 2019-04-20 NOTE — Telephone Encounter (Signed)
Thank you. I have updated her record.

## 2019-04-21 DIAGNOSIS — H401122 Primary open-angle glaucoma, left eye, moderate stage: Secondary | ICD-10-CM | POA: Diagnosis not present

## 2019-04-21 DIAGNOSIS — H353131 Nonexudative age-related macular degeneration, bilateral, early dry stage: Secondary | ICD-10-CM | POA: Diagnosis not present

## 2019-04-22 ENCOUNTER — Other Ambulatory Visit: Payer: Self-pay

## 2019-04-22 ENCOUNTER — Other Ambulatory Visit (INDEPENDENT_AMBULATORY_CARE_PROVIDER_SITE_OTHER): Payer: Medicare HMO

## 2019-04-22 DIAGNOSIS — M791 Myalgia, unspecified site: Secondary | ICD-10-CM | POA: Diagnosis not present

## 2019-04-22 LAB — COMPREHENSIVE METABOLIC PANEL
ALT: 16 U/L (ref 0–35)
AST: 19 U/L (ref 0–37)
Albumin: 3.9 g/dL (ref 3.5–5.2)
Alkaline Phosphatase: 61 U/L (ref 39–117)
BUN: 12 mg/dL (ref 6–23)
CO2: 30 mEq/L (ref 19–32)
Calcium: 9 mg/dL (ref 8.4–10.5)
Chloride: 97 mEq/L (ref 96–112)
Creatinine, Ser: 0.69 mg/dL (ref 0.40–1.20)
GFR: 81.51 mL/min (ref >60.00)
Glucose, Bld: 173 mg/dL — ABNORMAL HIGH (ref 70–99)
Potassium: 4.2 mEq/L (ref 3.5–5.1)
Sodium: 133 mEq/L — ABNORMAL LOW (ref 135–145)
Total Bilirubin: 0.3 mg/dL (ref 0.2–1.2)
Total Protein: 6.7 g/dL (ref 6.0–8.3)

## 2019-04-22 LAB — CBC
HCT: 38.7 % (ref 36.0–46.0)
Hemoglobin: 12.9 g/dL (ref 12.0–15.0)
MCHC: 33.4 g/dL (ref 30.0–36.0)
MCV: 90.9 fl (ref 78.0–100.0)
Platelets: 209 10*3/uL (ref 150.0–400.0)
RBC: 4.26 Mil/uL (ref 3.87–5.11)
RDW: 13 % (ref 11.5–15.5)
WBC: 4.2 10*3/uL (ref 4.0–10.5)

## 2019-04-22 LAB — T4, FREE: Free T4: 0.9 ng/dL (ref 0.60–1.60)

## 2019-04-25 ENCOUNTER — Other Ambulatory Visit: Payer: Self-pay | Admitting: Internal Medicine

## 2019-04-25 DIAGNOSIS — R7309 Other abnormal glucose: Secondary | ICD-10-CM

## 2019-04-26 ENCOUNTER — Other Ambulatory Visit (INDEPENDENT_AMBULATORY_CARE_PROVIDER_SITE_OTHER): Payer: Medicare HMO

## 2019-04-26 DIAGNOSIS — R7309 Other abnormal glucose: Secondary | ICD-10-CM | POA: Diagnosis not present

## 2019-04-26 LAB — GLUCOSE, RANDOM: Glucose, Bld: 90 mg/dL (ref 70–99)

## 2019-04-26 LAB — HEMOGLOBIN A1C: Hgb A1c MFr Bld: 6.4 % (ref 4.6–6.5)

## 2019-07-15 ENCOUNTER — Other Ambulatory Visit: Payer: Self-pay

## 2019-07-15 ENCOUNTER — Encounter: Payer: Self-pay | Admitting: Internal Medicine

## 2019-07-15 ENCOUNTER — Ambulatory Visit (INDEPENDENT_AMBULATORY_CARE_PROVIDER_SITE_OTHER): Payer: Medicare HMO | Admitting: Internal Medicine

## 2019-07-15 DIAGNOSIS — G479 Sleep disorder, unspecified: Secondary | ICD-10-CM | POA: Diagnosis not present

## 2019-07-15 DIAGNOSIS — Z7189 Other specified counseling: Secondary | ICD-10-CM | POA: Diagnosis not present

## 2019-07-15 DIAGNOSIS — K219 Gastro-esophageal reflux disease without esophagitis: Secondary | ICD-10-CM | POA: Diagnosis not present

## 2019-07-15 DIAGNOSIS — Z0000 Encounter for general adult medical examination without abnormal findings: Secondary | ICD-10-CM

## 2019-07-15 DIAGNOSIS — E785 Hyperlipidemia, unspecified: Secondary | ICD-10-CM

## 2019-07-15 DIAGNOSIS — G629 Polyneuropathy, unspecified: Secondary | ICD-10-CM

## 2019-07-15 MED ORDER — GABAPENTIN 100 MG PO CAPS: 100.0000 mg | ORAL_CAPSULE | Freq: Two times a day (BID) | ORAL | 3 refills | Status: DC

## 2019-07-15 MED ORDER — SIMVASTATIN 40 MG PO TABS: 40.0000 mg | ORAL_TABLET | Freq: Every day | ORAL | 3 refills | Status: DC

## 2019-07-15 NOTE — Assessment & Plan Note (Signed)
Better now No meds regularly

## 2019-07-15 NOTE — Assessment & Plan Note (Signed)
Cause of hand pain Gabapentin has helped--will refill

## 2019-07-15 NOTE — Assessment & Plan Note (Signed)
See social history 

## 2019-07-15 NOTE — Assessment & Plan Note (Signed)
Has sig concerns due to [place] of CAD Will restart primary prevention with the simvastatin

## 2019-07-15 NOTE — Assessment & Plan Note (Signed)
Doing better with the trazodone and a sleep machine

## 2019-07-15 NOTE — Assessment & Plan Note (Signed)
I have personally reviewed the Medicare Annual Wellness questionnaire and have noted 1. The patient's medical and social history 2. Their use of alcohol, tobacco or illicit drugs 3. Their current medications and supplements 4. The patient's functional ability including ADL's, fall risks, home safety risks and hearing or visual             impairment. 5. Diet and physical activities 6. Evidence for depression or mood disorders  The patients weight, height, BMI and visual acuity have been recorded in the chart I have made referrals, counseling and provided education to the patient based review of the above and I have provided the pt with a written personalized care plan for preventive services.  I have provided you with a copy of your personalized plan for preventive services. Please take the time to review along with your updated medication list.  Flu vaccine in the fall Normal colon in 2012---no more screening Continues mammograms--discussed pros and cons Had COVID vaccine Discussed fitness

## 2019-07-15 NOTE — Progress Notes (Signed)
Subjective:    Patient ID: Sheryl Miller, female    DOB: 25-Apr-1935, 84 y.o.   MRN: [id number]  HPI Here for Medicare wellness visit and follow up of chronic health conditions This visit occurred during the SARS-CoV-2 public health emergency.  Safety protocols were in place, including screening questions prior to the visit, additional usage of staff PPE, and extensive cleaning of exam room while observing appropriate contact time as indicated for disinfecting solutions.   Reviewed advanced directives Reviewed other doctors---- [place]--ophthal, [place]--derm, new dentist No hospitalizations or surgery in the past year Weekly wine No tobacco Walking regularly Vision is okay (implants and glasses) Hearing is fair Only fell once this year--no sig injury. Doing better with balance No depression or anhedonia Independent with instrumental ADLs No sig memory issues  Concerned about her heartbeat Ears "turn beat red" Also noted redness in face--took BP with machine (as high as 159) Has strong FH of CAD---son, sister   Had been on statin Stopped due to myalgia--but not better Discussed restarting  Hand pain diagnosed as neuropathy Ran out of gabapentin but it has helped Not going back to Dr [place] Silos  Ongoing sleep issues Using the trazodone--also has music player that helps  Current Outpatient Medications on File Prior to Visit  Medication Sig Dispense Refill  . Multiple Vitamins-Minerals (CENTRUM SILVER ULTRA WOMENS) TABS Take 1 tablet by mouth daily.     . Probiotic Product (PROBIOTIC PO) Take by mouth.    . traZODone (DESYREL) 150 MG tablet TAKE 1 TABLET AT BEDTIME AS NEEDED  FOR  SLEEP 90 tablet 3  . TURMERIC PO Take by mouth.    . gabapentin (NEURONTIN) 100 MG capsule Take 100 mg by mouth 2 (two) times daily as needed.     No current facility-administered medications on file prior to visit.    No Known Allergies  Past Medical History:  Diagnosis Date  .  Allergic rhinitis, cause unspecified   . Breast cyst   . Diverticulosis of colon (without mention of hemorrhage)   . Esophageal reflux   . Female stress incontinence   . Internal hemorrhoids without mention of complication   . Osteoporosis, unspecified   . Other and unspecified hyperlipidemia   . Raynaud's syndrome   . Rheumatic fever   . Unspecified constipation     Past Surgical History:  Procedure Laterality Date  . ABDOMINAL HYSTERECTOMY    . BLADDER REPAIR    . BREAST BIOPSY Right 2000   fibrocystic disease  . KNEE ARTHROSCOPY Right ~5/16  . SHOULDER ARTHROSCOPY WITH ROTATOR CUFF REPAIR AND SUBACROMIAL DECOMPRESSION  08/2001   R, Alphonzo Severance  . SHOULDER SURGERY      In 2006, bilaterally shoulder surgery for rotator cuff.  . TONSILLECTOMY    . TUBAL LIGATION      Family History  Problem Relation Age of Onset  . Coronary artery disease Mother   . Heart disease Mother   . Diabetes Mother   . COPD Father   . Breast cancer Sister        2 (age 25?, age 17?); "Gene pos"  . Diabetes Brother   . Breast cancer Maternal Aunt        2  . Stroke Paternal Grandmother   . Diabetes Brother   . Breast cancer Sister   . Breast cancer Daughter        ? at age 34  . Breast cancer Maternal Aunt   . Breast cancer Niece   .  Breast cancer Niece     Social History   Socioeconomic History  . Marital status: Married    Spouse name: Not on file  . Number of children: 4  . Years of education: Not on file  . Highest education level: Not on file  Occupational History  . Occupation: retired Scientist, clinical (histocompatibility and immunogenetics): RETIRED  Tobacco Use  . Smoking status: Never Smoker  . Smokeless tobacco: Never Used  Substance and Sexual Activity  . Alcohol use: No    Alcohol/week: 0.0 standard drinks  . Drug use: No  . Sexual activity: Yes    Birth control/protection: Post-menopausal  Other Topics Concern  . Not on file  Social History Narrative   Regular exercise-yes, walking or rides  stationery bike daily      Has living will.    Husband should make health care decisions as needed, then son Herbie [place].    Would accept resuscitation but no prolonged ventilation.    Would not want feeding tube for extended time if not cognitively aware            Social Determinants of Health   Financial Resource Strain:   . Difficulty of Paying Living Expenses:   Food Insecurity:   . Worried About Charity fundraiser in the Last Year:   . Arboriculturist in the Last Year:   Transportation Needs:   . Film/video editor (Medical):   Marland Kitchen Lack of Transportation (Non-Medical):   Physical Activity:   . Days of Exercise per Week:   . Minutes of Exercise per Session:   Stress:   . Feeling of Stress :   Social Connections:   . Frequency of Communication with Friends and Family:   . Frequency of Social Gatherings with Friends and Family:   . Attends Religious Services:   . Active Member of Clubs or Organizations:   . Attends Archivist Meetings:   Marland Kitchen Marital Status:   Intimate Partner Violence:   . Fear of Current or Ex-Partner:   . Emotionally Abused:   Marland Kitchen Physically Abused:   . Sexually Abused:    Review of Systems Appetite is good Weight up somewhat Wears seatbelt Teeth okay Not much heartburn--rare tums. No sig dysphagia Bowels are fine--no blood Voids okay--stream takes a while to start. No incontinence No sig back pain. Some pain in knees--OTC voltaren gel helps No suspicious skin lesions now No chest pain or SOB No dizziness or syncope     Objective:   Physical Exam  Constitutional: She is oriented to person, place, and time. She appears well-developed. No distress.  HENT:  Mouth/Throat: Oropharynx is clear and moist.  Full upper plate No oral lesions  Neck: No thyromegaly present.  Cardiovascular: Normal rate, regular rhythm, normal heart sounds and intact distal pulses. Exam reveals no gallop.  No murmur heard. Respiratory: Effort normal and  breath sounds normal. No respiratory distress. She has no wheezes. She has no rales.  GI: Soft. There is no abdominal tenderness.  Musculoskeletal:        General: No tenderness or edema.  Lymphadenopathy:    She has no cervical adenopathy.  Neurological: She is alert and oriented to person, place, and time.  President--- "Biden, Trump, Obama" 100- "I don't like this part" D-l-r-o-w Recall 3/3  Skin: No rash noted. No erythema.  Psychiatric: She has a normal mood and affect. Her behavior is normal.           Assessment &  Plan:

## 2019-08-23 ENCOUNTER — Ambulatory Visit: Payer: Medicare HMO | Admitting: Dermatology

## 2019-08-23 ENCOUNTER — Other Ambulatory Visit: Payer: Self-pay

## 2019-08-23 ENCOUNTER — Encounter: Payer: Self-pay | Admitting: Dermatology

## 2019-08-23 DIAGNOSIS — L820 Inflamed seborrheic keratosis: Secondary | ICD-10-CM

## 2019-08-23 DIAGNOSIS — L568 Other specified acute skin changes due to ultraviolet radiation: Secondary | ICD-10-CM

## 2019-08-23 DIAGNOSIS — L300 Nummular dermatitis: Secondary | ICD-10-CM | POA: Diagnosis not present

## 2019-08-23 DIAGNOSIS — L570 Actinic keratosis: Secondary | ICD-10-CM | POA: Diagnosis not present

## 2019-08-23 DIAGNOSIS — L821 Other seborrheic keratosis: Secondary | ICD-10-CM

## 2019-08-23 NOTE — Patient Instructions (Addendum)
Recommend daily broad spectrum sunscreen SPF 30+ to sun-exposed areas, reapply every 2 hours as needed. Call for new or changing lesions.   Prior to procedure, discussed risks of blister formation, small wound, skin dyspigmentation, or rare scar following cryotherapy.  Cryotherapy Aftercare  . Wash gently with soap and water everyday.   . Apply Vaseline and Band-Aid daily until healed.   

## 2019-08-23 NOTE — Progress Notes (Signed)
   Follow-Up Visit   Subjective  Sheryl Miller is a 84 y.o. female who presents for the following: Follow-up.  Patient presents today for follow up on Nummular Dermatitis, patient has been using TMC 0.1% cream on back and HC 2.5 % cream under the arms that does seem to help and had some AKs on lip that were treated with Cryotherapy on 02/22/19.  She also has some irritated growths she would like removed.  The following portions of the chart were reviewed this encounter and updated as appropriate:      Review of Systems:  No other skin or systemic complaints except as noted in HPI or Assessment and Plan.  Objective  Well appearing patient in no apparent distress; mood and affect are within normal limits.  A focused examination was performed including face, neck, chest and back. Relevant physical exam findings are noted in the Assessment and Plan.  Objective  Left  Mandible, Left Spinal Lower Back, Right Lower Back at Waistline: Erythematous keratotic or waxy stuck-on papule. Residual on Left mandible (Ln2 last visit)  Objective  Mid Lower Vermilion Lip (2): Mild keratotic scale at lower lip at R  and L lateral Vermillion edge  Objective  Back, axilla: Clear today   Assessment & Plan  Inflamed seborrheic keratosis (3) Left Spinal Lower Back; Right Lower Back at [facility]; Left  Mandible  Cryotherapy today Prior to procedure, discussed risks of blister formation, small wound, skin dyspigmentation, or rare scar following cryotherapy.    Destruction of lesion - Left  Mandible, Left Spinal Lower Back, Right Lower Back at Waistline  Destruction method: cryotherapy   Informed consent: discussed and consent obtained   Lesion destroyed using liquid nitrogen: Yes   Region frozen until ice ball extended beyond lesion: Yes   Outcome: patient tolerated procedure well with no complications   Post-procedure details: wound care instructions given    Actinic cheilitis (2) Mid  Lower Vermilion Lip  With Ak's, will treat AK's  x2 with cryotherapy today  Destruction of lesion - Mid Lower Vermilion Lip  Destruction method: cryotherapy   Destruction method comment:  Electrodessication Informed consent: discussed and consent obtained   Lesion destroyed using liquid nitrogen: Yes   Region frozen until ice ball extended beyond lesion: Yes   Outcome: patient tolerated procedure well with no complications   Post-procedure details: wound care instructions given   Additional details:  Prior to procedure, discussed risks of blister formation, small wound, skin dyspigmentation, or rare scar following cryotherapy.  Nummular dermatitis Back, axilla  Improved Continue using TMC cream 0.1% QD/BID to affected area for rash  at back PRN flares Continue using HC 2.5% QD/BID to affected area for rash at Axilla PRN flares  Recommend mild soap and moisturizing cream 1-2 times daily.     Seborrheic Keratoses - Stuck-on, waxy, tan-brown papules and plaques  - Discussed benign etiology and prognosis. - Observe - Call for any changes  Return in about 1 year (around 08/22/2020) for TBSE.  I, Donzetta Kohut, CMA, am acting as scribe for Brendolyn Patty, MD .  Documentation: I have reviewed the above documentation for accuracy and completeness, and I agree with the above.  Brendolyn Patty MD

## 2019-10-07 ENCOUNTER — Ambulatory Visit (INDEPENDENT_AMBULATORY_CARE_PROVIDER_SITE_OTHER): Payer: Medicare HMO | Admitting: Internal Medicine

## 2019-10-07 ENCOUNTER — Other Ambulatory Visit: Payer: Self-pay

## 2019-10-07 ENCOUNTER — Encounter: Payer: Self-pay | Admitting: Internal Medicine

## 2019-10-07 DIAGNOSIS — R519 Headache, unspecified: Secondary | ICD-10-CM | POA: Insufficient documentation

## 2019-10-07 NOTE — Progress Notes (Signed)
Subjective:    Patient ID: Sheryl Miller, female    DOB: [birth year], 84 y.o.   MRN: [id number]  HPI Here due to neck pain This visit occurred during the SARS-CoV-2 public health emergency.  Safety protocols were in place, including screening questions prior to the visit, additional usage of staff PPE, and extensive cleaning of exam room while observing appropriate contact time as indicated for disinfecting solutions.   Points to left preauricular area and down jaw Feels it in ear when she swallows Some in mandibular area as well Started about a week ago--but worsening No known injury Feels it constantly  No burning or stinging Does feel it in her throat  Current Outpatient Medications on File Prior to Visit  Medication Sig Dispense Refill  . brimonidine (ALPHAGAN) 0.2 % ophthalmic solution 1 drop 2 (two) times daily.    . brimonidine-timolol (COMBIGAN) 0.2-0.5 % ophthalmic solution Combigan 0.2 %-0.5 % eye drops    . diclofenac Sodium (VOLTAREN) 1 % GEL diclofenac 1 % topical gel    . gabapentin (NEURONTIN) 100 MG capsule Take 1 capsule (100 mg total) by mouth 2 (two) times daily. 180 capsule 3  . meloxicam (MOBIC) 7.5 MG tablet Take 7.5 mg by mouth daily as needed.    . Multiple Vitamins-Minerals (CENTRUM SILVER ULTRA WOMENS) TABS Take 1 tablet by mouth daily.     . Probiotic Product (PROBIOTIC PO) Take by mouth.    . simvastatin (ZOCOR) 40 MG tablet Take 1 tablet (40 mg total) by mouth at bedtime. 90 tablet 3  . timolol (TIMOPTIC) 0.5 % ophthalmic solution     . traZODone (DESYREL) 150 MG tablet TAKE 1 TABLET AT BEDTIME AS NEEDED  FOR  SLEEP 90 tablet 3  . triamcinolone cream (KENALOG) 0.1 %     . TURMERIC PO Take by mouth.     No current facility-administered medications on file prior to visit.    No Known Allergies  Past Medical History:  Diagnosis Date  . Allergic rhinitis, cause unspecified   . Breast cyst   . Diverticulosis of colon (without mention of  hemorrhage)   . Esophageal reflux   . Female stress incontinence   . Internal hemorrhoids without mention of complication   . Osteoporosis, unspecified   . Other and unspecified hyperlipidemia   . Raynaud's syndrome   . Rheumatic fever   . Unspecified constipation     Past Surgical History:  Procedure Laterality Date  . ABDOMINAL HYSTERECTOMY    . BLADDER REPAIR    . BREAST BIOPSY Right 2000   fibrocystic disease  . KNEE ARTHROSCOPY Right ~5/16  . SHOULDER ARTHROSCOPY WITH ROTATOR CUFF REPAIR AND SUBACROMIAL DECOMPRESSION  08/2001   R, Alphonzo Severance  . SHOULDER SURGERY      In 2006, bilaterally shoulder surgery for rotator cuff.  . TONSILLECTOMY    . TUBAL LIGATION      Family History  Problem Relation Age of Onset  . Coronary artery disease Mother   . Heart disease Mother   . Diabetes Mother   . COPD Father   . Breast cancer Sister        2 (age 62?, age 32?); "Gene pos"  . Diabetes Brother   . Breast cancer Maternal Aunt        2  . Stroke Paternal Grandmother   . Diabetes Brother   . Breast cancer Sister   . Breast cancer Daughter        ? at age  78  . Breast cancer Maternal Aunt   . Breast cancer Niece   . Breast cancer Niece     Social History   Socioeconomic History  . Marital status: Married    Spouse name: Not on file  . Number of children: 4  . Years of education: Not on file  . Highest education level: Not on file  Occupational History  . Occupation: retired Scientist, clinical (histocompatibility and immunogenetics): RETIRED  Tobacco Use  . Smoking status: Never Smoker  . Smokeless tobacco: Never Used  Vaping Use  . Vaping Use: Never used  Substance and Sexual Activity  . Alcohol use: No    Alcohol/week: 0.0 standard drinks  . Drug use: No  . Sexual activity: Yes    Birth control/protection: Post-menopausal  Other Topics Concern  . Not on file  Social History Narrative   Regular exercise-yes, walking or rides stationery bike daily      Has living will.    Husband should make  health care decisions as needed, then son Herbie [place].    Would accept resuscitation but no prolonged ventilation.    Would not want feeding tube for extended time if not cognitively aware            Social Determinants of Health   Financial Resource Strain:   . Difficulty of Paying Living Expenses:   Food Insecurity:   . Worried About Charity fundraiser in the Last Year:   . Arboriculturist in the Last Year:   Transportation Needs:   . Film/video editor (Medical):   Marland Kitchen Lack of Transportation (Non-Medical):   Physical Activity:   . Days of Exercise per Week:   . Minutes of Exercise per Session:   Stress:   . Feeling of Stress :   Social Connections:   . Frequency of Communication with Friends and Family:   . Frequency of Social Gatherings with Friends and Family:   . Attends Religious Services:   . Active Member of Clubs or Organizations:   . Attends Archivist Meetings:   Marland Kitchen Marital Status:   Intimate Partner Violence:   . Fear of Current or Ex-Partner:   . Emotionally Abused:   Marland Kitchen Physically Abused:   . Sexually Abused:    Review of Systems No headache No vision changes No fever Keeps up with dentist--no apparent problems    Objective:   Physical Exam HENT:     Head: Normocephalic and atraumatic.     Right Ear: Tympanic membrane and ear canal normal.     Left Ear: Tympanic membrane and ear canal normal.     Ears:     Comments: No tragal tenderness    Mouth/Throat:     Pharynx: No oropharyngeal exudate or posterior oropharyngeal erythema.     Comments: No TMJ tenderness No oral lesions--checked with lower partial out No oral tenderness Neck:     Comments: Mild pain with neck tilt both ways Musculoskeletal:     Cervical back: Normal range of motion and neck supple.  Lymphadenopathy:     Cervical: No cervical adenopathy.            Assessment & Plan:

## 2019-10-07 NOTE — Assessment & Plan Note (Signed)
Associated with ear and throat No TMJ tenderness No obvious oral lesions Not clearly nerve type pain--but need to consider trigeminal neuralgia Recommended having dentist check --just to be sure Try the gabapentin daily (she only uses it prn now) If pain worsens, I will set her up with neurologist [facility])

## 2019-10-25 ENCOUNTER — Other Ambulatory Visit: Payer: Self-pay | Admitting: Dermatology

## 2019-10-28 DIAGNOSIS — H401122 Primary open-angle glaucoma, left eye, moderate stage: Secondary | ICD-10-CM | POA: Diagnosis not present

## 2019-11-09 ENCOUNTER — Other Ambulatory Visit: Payer: Self-pay | Admitting: Internal Medicine

## 2019-12-12 ENCOUNTER — Other Ambulatory Visit: Payer: Self-pay | Admitting: Dermatology

## 2019-12-13 ENCOUNTER — Other Ambulatory Visit: Payer: Self-pay | Admitting: Internal Medicine

## 2019-12-13 DIAGNOSIS — Z1231 Encounter for screening mammogram for malignant neoplasm of breast: Secondary | ICD-10-CM

## 2019-12-15 ENCOUNTER — Ambulatory Visit (INDEPENDENT_AMBULATORY_CARE_PROVIDER_SITE_OTHER): Payer: Medicare HMO

## 2019-12-15 DIAGNOSIS — Z23 Encounter for immunization: Secondary | ICD-10-CM | POA: Diagnosis not present

## 2020-01-11 ENCOUNTER — Ambulatory Visit
Admission: RE | Admit: 2020-01-11 | Discharge: 2020-01-11 | Disposition: A | Discharge status: Home / Self Care | Payer: Medicare HMO | Source: Ambulatory Visit | Attending: Internal Medicine | Admitting: Internal Medicine

## 2020-01-11 ENCOUNTER — Other Ambulatory Visit: Payer: Self-pay

## 2020-01-11 DIAGNOSIS — Z1231 Encounter for screening mammogram for malignant neoplasm of breast: Secondary | ICD-10-CM

## 2020-01-30 ENCOUNTER — Other Ambulatory Visit: Payer: Self-pay | Admitting: Dermatology

## 2020-02-01 DIAGNOSIS — G43109 Migraine with aura, not intractable, without status migrainosus: Secondary | ICD-10-CM | POA: Diagnosis not present

## 2020-03-13 MED ORDER — MELOXICAM 7.5 MG PO TABS
7.5000 mg | ORAL_TABLET | Freq: Every day | ORAL | 1 refills | Status: DC | PRN
Start: 2020-03-13 — End: 2020-07-03

## 2020-04-18 ENCOUNTER — Other Ambulatory Visit: Payer: Self-pay | Admitting: Internal Medicine

## 2020-04-25 DIAGNOSIS — H40001 Preglaucoma, unspecified, right eye: Secondary | ICD-10-CM | POA: Diagnosis not present

## 2020-05-03 DIAGNOSIS — H40001 Preglaucoma, unspecified, right eye: Secondary | ICD-10-CM | POA: Diagnosis not present

## 2020-05-09 DIAGNOSIS — M65312 Trigger thumb, left thumb: Secondary | ICD-10-CM | POA: Diagnosis not present

## 2020-05-15 ENCOUNTER — Other Ambulatory Visit: Payer: Self-pay

## 2020-05-15 ENCOUNTER — Ambulatory Visit: Payer: Medicare HMO | Admitting: Dermatology

## 2020-05-15 DIAGNOSIS — L219 Seborrheic dermatitis, unspecified: Secondary | ICD-10-CM

## 2020-05-15 DIAGNOSIS — L820 Inflamed seborrheic keratosis: Secondary | ICD-10-CM

## 2020-05-15 MED ORDER — MOMETASONE FUROATE 0.1 % EX SOLN: CUTANEOUS | 1 refills | Status: DC

## 2020-05-15 MED ORDER — KETOCONAZOLE 2 % EX SHAM: MEDICATED_SHAMPOO | CUTANEOUS | 2 refills | Status: DC

## 2020-05-15 NOTE — Progress Notes (Signed)
   Follow-Up Visit   Subjective  Sheryl Miller is a 85 y.o. female who presents for the following: Hair/Scalp Problem (Patient here to have "scabs" looked at in scalp. They came up about a month ago and won't clear. She has tried different shampoos including T-Sal, Tea Tree, and Ginger shampoo. She hits these areas with her comb.).   The following portions of the chart were reviewed this encounter and updated as appropriate:       Review of Systems:  No other skin or systemic complaints except as noted in HPI or Assessment and Plan.  Objective  Well appearing patient in no apparent distress; mood and affect are within normal limits.  A focused examination was performed including face, scalp. Relevant physical exam findings are noted in the Assessment and Plan.  Objective  Vertex scalp x 3, L temple hairline x 1 (4): Keratotic papules.  Objective  Scalp: Pink scaliness of the vertex scalp.   Assessment & Plan  Inflamed seborrheic keratosis (4) Vertex scalp x 3, L temple hairline x 1  Destruction of lesion - Vertex scalp x 3, L temple hairline x 1  Destruction method: cryotherapy   Informed consent: discussed and consent obtained   Lesion destroyed using liquid nitrogen: Yes   Region frozen until ice ball extended beyond lesion: Yes   Outcome: patient tolerated procedure well with no complications   Post-procedure details: wound care instructions given    Seborrheic dermatitis Scalp  Start mometasone solution Apply to AA scalp BID until improved and prn recurrence dsp 36mL 1Rf. Avoid face, groin, underarms.  Start ketoconazole 2% shampoo Massage into scalp, let sit several minutes before rinsing dsp 137mL 2Rf.  Seborrheic Dermatitis  -  is a chronic persistent rash characterized by pinkness and scaling most commonly of the mid face but also can occur on the scalp (dandruff), ears; mid chest and mid back. It tends to be exacerbated by stress and cooler weather.   People who have neurologic disease may experience new onset or exacerbation of existing seborrheic dermatitis.  The condition is not curable but treatable and can be controlled.   Topical steroids (such as triamcinolone, fluocinolone, fluocinonide, mometasone, clobetasol, halobetasol, betamethasone, hydrocortisone) can cause thinning and lightening of the skin if they are used for too long in the same area. Your physician has selected the right strength medicine for your problem and area affected on the body. Please use your medication only as directed by your physician to prevent side effects.    mometasone (ELOCON) 0.1 % lotion - Scalp  ketoconazole (NIZORAL) 2 % shampoo - Scalp  Return in about 6 weeks (around 06/26/2020) for recheck scalp.   IJamesetta Orleans, CMA, am acting as scribe for Brendolyn Patty, MD .  Documentation: I have reviewed the above documentation for accuracy and completeness, and I agree with the above.  Brendolyn Patty MD

## 2020-05-15 NOTE — Progress Notes (Signed)
RX will not switch to Solution. Per Harmon Pier at [facility], will be switched manually to Mometasone Solution.

## 2020-05-15 NOTE — Patient Instructions (Signed)
Cryotherapy Aftercare  . Wash gently with soap and water everyday.   . Apply Vaseline and Band-Aid daily until healed.  

## 2020-06-12 DIAGNOSIS — M65312 Trigger thumb, left thumb: Secondary | ICD-10-CM | POA: Diagnosis not present

## 2020-06-30 ENCOUNTER — Other Ambulatory Visit: Payer: Self-pay

## 2020-06-30 ENCOUNTER — Telehealth (INDEPENDENT_AMBULATORY_CARE_PROVIDER_SITE_OTHER): Payer: Medicare HMO | Admitting: Family Medicine

## 2020-06-30 DIAGNOSIS — R197 Diarrhea, unspecified: Secondary | ICD-10-CM | POA: Diagnosis not present

## 2020-06-30 NOTE — Progress Notes (Signed)
Patient ID: Sheryl Miller, female   DOB: 08-04-1935, 85 y.o.   MRN: [id number]   This visit type was conducted due to national recommendations for restrictions regarding the COVID-19 pandemic in an effort to limit this patient's exposure and mitigate transmission in our community.   Virtual Visit via Telephone Note  I connected with Sheryl Miller on 06/30/20 at 10:10 AM EDT by telephone and verified that I am speaking with the correct person using two identifiers.   I discussed the limitations, risks, security and privacy concerns of performing an evaluation and management service by telephone and the availability of in person appointments. I also discussed with the patient that there may be a patient responsible charge related to this service. The patient expressed understanding and agreed to proceed.  Location patient: home Location provider: work or home office Participants present for the call: patient, provider Patient did not have a visit in the prior 7 days to address this/these issue(s).   History of Present Illness:  Sheryl Miller called with onset of diarrhea stools this past Tuesday.  She states that since Tuesday she has gone about 10 times per day with watery nonbloody stools.  No fever.  She generally has constipation issues but not recently.  Denies any bloody stools, fever, abdominal pain, nausea, vomiting, dizziness.  She has had some mild fatigue.  She states her symptoms are slightly better yesterday but then have worsened again this morning in terms of the diarrhea frequency.  Denies any recent antibiotics.  No recent travels.  No new medications.  She tried eating a meat ball with cheese and sauce yesterday and this seemed to aggravate her symptoms.  She has been keeping down some electrolyte replacement with propel.  Her chronic problems include history of neuropathy, osteoarthritis, osteoporosis, hyperlipidemia.  Past Medical History:  Diagnosis Date  . Allergic  rhinitis, cause unspecified   . Breast cyst   . Diverticulosis of colon (without mention of hemorrhage)   . Esophageal reflux   . Female stress incontinence   . Internal hemorrhoids without mention of complication   . Osteoporosis, unspecified   . Other and unspecified hyperlipidemia   . Raynaud's syndrome   . Rheumatic fever   . Unspecified constipation    Past Surgical History:  Procedure Laterality Date  . ABDOMINAL HYSTERECTOMY    . BLADDER REPAIR    . BREAST BIOPSY Right 2000   fibrocystic disease  . KNEE ARTHROSCOPY Right ~5/16  . SHOULDER ARTHROSCOPY WITH ROTATOR CUFF REPAIR AND SUBACROMIAL DECOMPRESSION  08/2001   R, Alphonzo Severance  . SHOULDER SURGERY      In 2006, bilaterally shoulder surgery for rotator cuff.  . TONSILLECTOMY    . TUBAL LIGATION      reports that she has never smoked. She has never used smokeless tobacco. She reports that she does not drink alcohol and does not use drugs. family history includes Breast cancer in her daughter, maternal aunt, maternal aunt, niece, niece, sister, and sister; COPD in her father; Coronary artery disease in her mother; Diabetes in her brother, brother, and mother; Heart disease in her mother; Stroke in her paternal grandmother. No Known Allergies    Observations/Objective: Patient sounds cheerful and well on the phone. I do not appreciate any SOB. Speech and thought processing are grossly intact. Patient reported vitals:  Assessment and Plan:  Patient presents with 3 to 4-day history of diarrhea.  No clear precipitating factors such as recent antibiotic, travel, new medications, etc.  She  is keeping down fluids well and afebrile.  Does not have any red flags such as bloody stools, severe abdominal pain, etc. Denies any dizziness, syncope, or any other symptoms to suggest severe dehydration.  She is still urinating fairly regularly.  -Continue electrolyte replacement -Recommend bland diet and we discussed dietary factors in  some detail. -Recommend follow-up with primary by next week if symptoms not improving -Follow-up sooner for any fever, bloody stools, severe abdominal pain, recurrent nausea or vomiting -Handout sent through [facility] regarding appropriate diet for diarrhea symptoms -She will consider trial of over-the-counter Imodium for severe diarrhea symptoms -  Follow Up Instructions:  - as above.   [id number] [id number] [id number] 11-20 [id number] 21-30 I did not refer this patient for an OV in the next 24 hours for this/these issue(s).  I discussed the assessment and treatment plan with the patient. The patient was provided an opportunity to ask questions and all were answered. The patient agreed with the plan and demonstrated an understanding of the instructions.   The patient was advised to call back or seek an in-person evaluation if the symptoms worsen or if the condition fails to improve as anticipated.  I provided 21 minutes of non-face-to-face time during this encounter.   Carolann Littler, MD

## 2020-06-30 NOTE — Patient Instructions (Signed)
Food Choices to Help Relieve Diarrhea, Adult Diarrhea can make you feel weak and cause you to become dehydrated. It is important to choose the right foods and drinks to:  Relieve diarrhea.  Replace lost fluids and nutrients.  Prevent dehydration. What are tips for following this plan? Relieving diarrhea  Avoid foods that make your diarrhea worse. These may include: ? Foods and beverages sweetened with high-fructose corn syrup, honey, or sweeteners such as xylitol, sorbitol, and mannitol. ? Fried, greasy, or spicy foods. ? Raw fruits and vegetables.  Eat foods that are rich in probiotics. These include foods such as yogurt and fermented milk products. Probiotics can help increase healthy bacteria in your stomach and intestines (gastrointestinal tract or GI tract). This may help digestion and stop diarrhea.  If you have lactose intolerance, avoid dairy products. These may make your diarrhea worse.  Take medicine to help stop diarrhea only as told by your health care provider. Replacing nutrients  Eat bland, easy-to-digest foods in small amounts as you are able, until your diarrhea starts to get better. These foods include bananas, applesauce, rice, toast, and crackers.  Gradually reintroduce nutrient-rich foods as tolerated or as told by your health care provider. This includes: ? Well-cooked protein foods, such as eggs, lean meats like fish or chicken without skin, and tofu. ? Peeled, seeded, and soft-cooked fruits and vegetables. ? Low-fat dairy products. ? Whole grains.  Take vitamin and mineral supplements as told by your health care provider.   Preventing dehydration  Start by sipping water or a solution to prevent dehydration (oral rehydration solution, ORS). This is a drink that helps replace fluids and minerals your body has lost. You can buy an ORS at pharmacies and retail stores.  Try to drink at least 8-10 cups (2,000-2,500 mL) of fluid each day to help replace lost  fluids. If you have urine that is pale yellow, you are getting enough fluids.  You may drink other liquids in addition to water, such as fruit juice that you have added water to (diluted fruit juice) or low-calorie sports drinks, as tolerated or as told by your health care provider.  Avoid drinks with caffeine, such as coffee, tea, or soft drinks.  Avoid alcohol.   Summary  When you have diarrhea, it is important to choose the right foods and drinks to relieve diarrhea, to replace lost fluids and nutrients, and to prevent dehydration.  Make sure you drink enough fluid to keep your urine pale yellow.  You may benefit from eating bland foods at first. Gradually reintroduce healthy, nutrient-rich foods as tolerated or as told by your health care provider.  Avoid foods that make your diarrhea worse, such as fried, greasy, or spicy foods. This information is not intended to replace advice given to you by your health care provider. Make sure you discuss any questions you have with your health care provider. Document Revised: 04/12/2019 Document Reviewed: 04/12/2019 Elsevier Patient Education  2021 Elsevier Inc.  

## 2020-07-02 ENCOUNTER — Other Ambulatory Visit: Payer: Self-pay

## 2020-07-02 ENCOUNTER — Telehealth: Payer: Self-pay

## 2020-07-02 ENCOUNTER — Ambulatory Visit: Payer: Medicare HMO | Admitting: Dermatology

## 2020-07-02 DIAGNOSIS — L219 Seborrheic dermatitis, unspecified: Secondary | ICD-10-CM | POA: Diagnosis not present

## 2020-07-02 DIAGNOSIS — L820 Inflamed seborrheic keratosis: Secondary | ICD-10-CM

## 2020-07-02 NOTE — Telephone Encounter (Signed)
Please check on her again tomorrow. If not improving further, should offer appt on Wednesday

## 2020-07-02 NOTE — Progress Notes (Signed)
   Follow-Up Visit   Subjective  Sheryl Miller is a 85 y.o. female who presents for the following: Hair/Scalp Problem (Patient here for 6 week follow-up. She had a few ISKs treated on the scalp, which have improved. She also had Seborrhic Dermatitis of the scalp, improved with ketoconazole shampoo and mometasone solution. She is not using solution or shampoo at this time.).    The following portions of the chart were reviewed this encounter and updated as appropriate:       Review of Systems:  No other skin or systemic complaints except as noted in HPI or Assessment and Plan.  Objective  Well appearing patient in no apparent distress; mood and affect are within normal limits.  A focused examination was performed including face, scalp. Relevant physical exam findings are noted in the Assessment and Plan.  Objective  Scalp: Scalp clear today.  Objective  Scalp: Clear today.   Assessment & Plan  Seborrheic dermatitis Scalp  Controlled.  Continue Ketoconazole shampoo prn scalp.  Continue Mometasone solution prn flares.  Seborrheic Dermatitis  -  is a chronic persistent rash characterized by pinkness and scaling most commonly of the mid face but also can occur on the scalp (dandruff), ears; mid chest and mid back. It tends to be exacerbated by stress and cooler weather.  People who have neurologic disease may experience new onset or exacerbation of existing seborrheic dermatitis.  The condition is not curable but treatable and can be controlled.   Other Related Medications ketoconazole (NIZORAL) 2 % shampoo mometasone (ELOCON) 0.1 % lotion  Inflamed seborrheic keratosis Scalp  Resolved post cryotherapy. Observation.  Return as scheduled for TBSE.   IJamesetta Orleans, CMA, am acting as scribe for Brendolyn Patty, MD .  Documentation: I have reviewed the above documentation for accuracy and completeness, and I agree with the above.  Brendolyn Patty MD

## 2020-07-02 NOTE — Telephone Encounter (Signed)
Per chart review tab pt had visit with [facility]. Sending note to Dr Silvio Pate and Larene Beach CMA.

## 2020-07-02 NOTE — Telephone Encounter (Signed)
[facility] - Client TELEPHONE ADVICE RECORD [facility] Patient Name: Sheryl Miller EARNED Gender: Female DOB: Apr 17, 1935 Age: 85 Y 10 M 27 D Return Phone Number: [phone] (Primary), [phone] (Secondary) [place] [address] Client [facility] - Client Client Site [place] Physician Viviana Simpler- MD Contact Type Call Who Is Calling Patient / Member / Family / Caregiver Call Type Triage / Clinical Relationship To Patient Self Return Phone Number [phone] (Primary) Chief Complaint Diarrhea Reason for Call Symptomatic / Request for [place] states wants to see Dr Silvio Pate today; diarrhea 2 days and didn't eat for those 2 days Translation No Nurse Assessment Nurse: Ardyth Harps, RN, Caryn Date/Time (Eastern Time): 06/30/2020 8:40:43 AM Confirm and document reason for call. If symptomatic, describe symptoms. ---Caller states she has been having diarrhea for 2 days now and hasn't been eating. Diarrhea started on Tuesday. She chose not to eat to see if it would decrease, started eating again yesterday and diarrhea got worse. Been 5 times so far this morning. Taking Pepto Bismol. Denies fever. Does the patient have any new or worsening symptoms? ---Yes Will a triage be completed? ---Yes Related visit to physician within the last 2 weeks? ---No Does the PT have any chronic conditions? (i.e. diabetes, asthma, this includes High risk factors for pregnancy, etc.) ---No Is this a behavioral health or substance abuse call? ---No Guidelines Guideline Title Affirmed Question Affirmed Notes Nurse Date/Time (Eastern Time) Diarrhea [1] SEVERE diarrhea (e.g., 7 or more times / day more than normal) AND [2] age > 71 years Ardyth Harps, [place], Clois Comber 06/30/2020 8:43:50 AM Disp. Time Eilene Ghazi Time) Disposition Final  User PLEASE NOTE: All timestamps contained within this report are represented as [place] Standard Time. CONFIDENTIALTY NOTICE: This fax transmission is intended only for the addressee. It contains information that is legally privileged, confidential or otherwise protected from use or disclosure. If you are not the intended recipient, you are strictly prohibited from reviewing, disclosing, copying using or disseminating any of this information or taking any action in reliance on or regarding this information. If you have received this fax in error, please notify us immediately by telephone so that we can arrange for its return to [place]. Phone: [phone], Toll-Free: [phone], Fax: [phone] Page: 2 of 2 Call Id: [id number] 06/30/2020 8:45:41 AM See HCP within 4 Hours (or PCP triage) Yes Ardyth Harps, RN, Caryn Caller Disagree/Comply Comply Caller Understands Yes PreDisposition InappropriateToAsk Care Advice Given Per Guideline SEE HCP (OR PCP TRIAGE) WITHIN 4 HOURS: CLEAR FLUIDS: CARE ADVICE given per Diarrhea (Adult) guideline. Referrals [facility]

## 2020-07-02 NOTE — Patient Instructions (Signed)

## 2020-07-02 NOTE — Telephone Encounter (Signed)
Spoke to pt. She said yesterday she saw a little mucous and blood  In her underwear. Still having stomach cramps. Having fewer diarrhea episodes. Says a few drops.

## 2020-07-03 ENCOUNTER — Encounter: Payer: Self-pay | Admitting: Internal Medicine

## 2020-07-03 ENCOUNTER — Ambulatory Visit (INDEPENDENT_AMBULATORY_CARE_PROVIDER_SITE_OTHER): Payer: Medicare HMO | Admitting: Internal Medicine

## 2020-07-03 DIAGNOSIS — R109 Unspecified abdominal pain: Secondary | ICD-10-CM | POA: Insufficient documentation

## 2020-07-03 NOTE — Telephone Encounter (Signed)
Yes---you can add her at 12:30

## 2020-07-03 NOTE — Assessment & Plan Note (Addendum)
With diarrhea Most likely gastroenteritis/colitis Exam not consistent with cholecystitis or diverticulitis Doubt pancreatitis Vastly improved now---will just continue observation If cramping persists, would check abdominal ultrasound

## 2020-07-03 NOTE — Telephone Encounter (Signed)
Actually, we had a cancellation today at 1045. I called the pt and she could come then.

## 2020-07-03 NOTE — Progress Notes (Signed)
Subjective:    Patient ID: Emerson Monte, female    DOB: [birth year], 85 y.o.   MRN: [id number]  HPI Here due to abdominal cramping and diarrhea This visit occurred during the SARS-CoV-2 public health emergency.  Safety protocols were in place, including screening questions prior to the visit, additional usage of staff PPE, and extensive cleaning of exam room while observing appropriate contact time as indicated for disinfecting solutions.   Started last Monday (8 days ago)---diarrhea (loose and about 10 times a day) for a couple of days with stomach cramps. Got mucus in underwear with red spots twice  Then just going small quantities of diarrhea but "cramps like crazy". Stool was not as loose Tried BRAT diet Has persisted through weekend Still having cramps now but not quite as bad  Current Outpatient Medications on File Prior to Visit  Medication Sig Dispense Refill  . brimonidine (ALPHAGAN) 0.2 % ophthalmic solution 1 drop 2 (two) times daily.    . diclofenac Sodium (VOLTAREN) 1 % GEL diclofenac 1 % topical gel    . ketoconazole (NIZORAL) 2 % shampoo Massage into scalp every other day, let sit several minutes before rinsing. 120 mL 2  . mometasone (ELOCON) 0.1 % lotion Apply to affected areas scalp 2 times a day until improved and as needed for recurrence. 60 mL 1  . Multiple Vitamins-Minerals (CENTRUM SILVER ULTRA WOMENS) TABS Take 1 tablet by mouth daily.     . Probiotic Product (PROBIOTIC PO) Take by mouth.    . simvastatin (ZOCOR) 40 MG tablet TAKE 1 TABLET AT BEDTIME 90 tablet 3  . timolol (TIMOPTIC) 0.5 % ophthalmic solution     . traZODone (DESYREL) 150 MG tablet TAKE 1 TABLET AT BEDTIME AS NEEDED  FOR  SLEEP 90 tablet 3  . triamcinolone (KENALOG) 0.1 % APPLY TO AFFECTED AREAS OF RASH ON BACK 1 TO 2 TIMES DAILY UNTIL IMPROVED. AVOID FACE, GROIN, AXILLA. 80 g 1  . TURMERIC PO Take by mouth.    . gabapentin (NEURONTIN) 100 MG capsule Take 1 capsule (100 mg total) by mouth 2  (two) times daily. (Patient not taking: Reported on 07/03/2020) 180 capsule 3   No current facility-administered medications on file prior to visit.    No Known Allergies  Past Medical History:  Diagnosis Date  . Allergic rhinitis, cause unspecified   . Breast cyst   . Diverticulosis of colon (without mention of hemorrhage)   . Esophageal reflux   . Female stress incontinence   . Internal hemorrhoids without mention of complication   . Osteoporosis, unspecified   . Other and unspecified hyperlipidemia   . Raynaud's syndrome   . Rheumatic fever   . Unspecified constipation     Past Surgical History:  Procedure Laterality Date  . ABDOMINAL HYSTERECTOMY    . BLADDER REPAIR    . BREAST BIOPSY Right 2000   fibrocystic disease  . KNEE ARTHROSCOPY Right ~5/16  . SHOULDER ARTHROSCOPY WITH ROTATOR CUFF REPAIR AND SUBACROMIAL DECOMPRESSION  08/2001   R, Alphonzo Severance  . SHOULDER SURGERY      In 2006, bilaterally shoulder surgery for rotator cuff.  . TONSILLECTOMY    . TUBAL LIGATION      Family History  Problem Relation Age of Onset  . Coronary artery disease Mother   . Heart disease Mother   . Diabetes Mother   . COPD Father   . Breast cancer Sister        2 (age 37?, age  45?); "Gene pos"  . Diabetes Brother   . Breast cancer Maternal Aunt        2  . Stroke Paternal Grandmother   . Diabetes Brother   . Breast cancer Sister   . Breast cancer Daughter        ? at age 55  . Breast cancer Maternal Aunt   . Breast cancer Niece   . Breast cancer Niece     Social History   Socioeconomic History  . Marital status: Married    Spouse name: Not on file  . Number of children: 4  . Years of education: Not on file  . Highest education level: Not on file  Occupational History  . Occupation: retired Scientist, clinical (histocompatibility and immunogenetics): RETIRED  Tobacco Use  . Smoking status: Never Smoker  . Smokeless tobacco: Never Used  Vaping Use  . Vaping Use: Never used  Substance and Sexual Activity   . Alcohol use: No    Alcohol/week: 0.0 standard drinks  . Drug use: No  . Sexual activity: Yes    Birth control/protection: Post-menopausal  Other Topics Concern  . Not on file  Social History Narrative   Regular exercise-yes, walking or rides stationery bike daily      Has living will.    Husband should make health care decisions as needed, then son Herbie [place].    Would accept resuscitation but no prolonged ventilation.    Would not want feeding tube for extended time if not cognitively aware            Social Determinants of Health   Financial Resource Strain: Not on file  Food Insecurity: Not on file  Transportation Needs: Not on file  Physical Activity: Not on file  Stress: Not on file  Social Connections: Not on file  Intimate Partner Violence: Not on file   Review of Systems  Appetite is better--able to eat 2 eggs and toast this morning No N/V No fever No recent travel No recent antibiotics No other ill exposures No cough or breathing problems    Objective:   Physical Exam Constitutional:      Appearance: Normal appearance.  Cardiovascular:     Rate and Rhythm: Normal rate and regular rhythm.     Heart sounds: No murmur heard. No gallop.   Pulmonary:     Effort: Pulmonary effort is normal.     Breath sounds: Normal breath sounds. No wheezing or rales.  Abdominal:     General: Bowel sounds are normal. There is no distension.     Palpations: Abdomen is soft. There is no mass.     Tenderness: There is no guarding or rebound.     Hernia: No hernia is present.     Comments: Very slight sensitivity around LUQ Murphy's sign absent  Musculoskeletal:     Cervical back: Neck supple.     Right lower leg: No edema.     Left lower leg: No edema.  Lymphadenopathy:     Cervical: No cervical adenopathy.  Neurological:     Mental Status: She is alert.            Assessment & Plan:

## 2020-07-03 NOTE — Telephone Encounter (Signed)
Spoke to pt. She said she is still having cramps. She had 1-2 episodes of loose stool yesterday, but not diarrhea. Should we work her in tomorrow?

## 2020-07-17 ENCOUNTER — Ambulatory Visit: Payer: Medicare HMO | Admitting: Internal Medicine

## 2020-07-26 ENCOUNTER — Other Ambulatory Visit: Payer: Self-pay

## 2020-07-26 DIAGNOSIS — L219 Seborrheic dermatitis, unspecified: Secondary | ICD-10-CM

## 2020-07-26 MED ORDER — MOMETASONE FUROATE 0.1 % EX SOLN: CUTANEOUS | 0 refills | Status: DC

## 2020-07-26 MED ORDER — KETOCONAZOLE 2 % EX SHAM: MEDICATED_SHAMPOO | CUTANEOUS | 0 refills | Status: DC

## 2020-07-26 NOTE — Progress Notes (Signed)
RX RFs approved. Original request trough fax.

## 2020-07-27 ENCOUNTER — Ambulatory Visit: Payer: Medicare HMO | Admitting: Internal Medicine

## 2020-07-31 ENCOUNTER — Other Ambulatory Visit: Payer: Self-pay

## 2020-07-31 ENCOUNTER — Ambulatory Visit (INDEPENDENT_AMBULATORY_CARE_PROVIDER_SITE_OTHER): Payer: Medicare HMO | Admitting: Internal Medicine

## 2020-07-31 ENCOUNTER — Encounter: Payer: Self-pay | Admitting: Internal Medicine

## 2020-07-31 VITALS — BP 120/80 | HR 63 | Temp 97.8°F | Ht 61.75 in | Wt 155.2 lb

## 2020-07-31 DIAGNOSIS — Z7189 Other specified counseling: Secondary | ICD-10-CM | POA: Diagnosis not present

## 2020-07-31 DIAGNOSIS — E785 Hyperlipidemia, unspecified: Secondary | ICD-10-CM | POA: Diagnosis not present

## 2020-07-31 DIAGNOSIS — J301 Allergic rhinitis due to pollen: Secondary | ICD-10-CM

## 2020-07-31 DIAGNOSIS — Z0000 Encounter for general adult medical examination without abnormal findings: Secondary | ICD-10-CM | POA: Diagnosis not present

## 2020-07-31 DIAGNOSIS — G629 Polyneuropathy, unspecified: Secondary | ICD-10-CM

## 2020-07-31 DIAGNOSIS — G479 Sleep disorder, unspecified: Secondary | ICD-10-CM | POA: Diagnosis not present

## 2020-07-31 LAB — CBC
HCT: 36.6 % (ref 36.0–46.0)
Hemoglobin: 12.8 g/dL (ref 12.0–15.0)
MCHC: 35 g/dL (ref 30.0–36.0)
MCV: 88.9 fl (ref 78.0–100.0)
Platelets: 222 10*3/uL (ref 150.0–400.0)
RBC: 4.11 Mil/uL (ref 3.87–5.11)
RDW: 13.4 % (ref 11.5–15.5)
WBC: 5.3 10*3/uL (ref 4.0–10.5)

## 2020-07-31 LAB — COMPREHENSIVE METABOLIC PANEL WITH GFR
ALT: 12 U/L (ref 0–35)
AST: 17 U/L (ref 0–37)
Albumin: 4.1 g/dL (ref 3.5–5.2)
Alkaline Phosphatase: 61 U/L (ref 39–117)
BUN: 13 mg/dL (ref 6–23)
CO2: 29 meq/L (ref 19–32)
Calcium: 9.3 mg/dL (ref 8.4–10.5)
Chloride: 93 meq/L — ABNORMAL LOW (ref 96–112)
Creatinine, Ser: 0.62 mg/dL (ref 0.40–1.20)
GFR: 82.6 mL/min
Glucose, Bld: 94 mg/dL (ref 70–99)
Potassium: 4.7 meq/L (ref 3.5–5.1)
Sodium: 128 meq/L — ABNORMAL LOW (ref 135–145)
Total Bilirubin: 0.6 mg/dL (ref 0.2–1.2)
Total Protein: 6.5 g/dL (ref 6.0–8.3)

## 2020-07-31 LAB — LIPID PANEL
Cholesterol: 188 mg/dL (ref 0–200)
HDL: 70.9 mg/dL
LDL Cholesterol: 98 mg/dL (ref 0–99)
NonHDL: 116.88
Total CHOL/HDL Ratio: 3
Triglycerides: 96 mg/dL (ref 0.0–149.0)
VLDL: 19.2 mg/dL (ref 0.0–40.0)

## 2020-07-31 LAB — VITAMIN B12: Vitamin B-12: 562 pg/mL (ref 211–911)

## 2020-07-31 LAB — T4, FREE: Free T4: 0.77 ng/dL (ref 0.60–1.60)

## 2020-07-31 NOTE — Progress Notes (Signed)
Hearing Screening   125Hz  250Hz  500Hz  1000Hz  2000Hz  3000Hz  4000Hz  6000Hz  8000Hz   Right ear:  20 20 20 20  20     Left ear:  20 20 20 20  20     Vision Screening Comments: Last eye exam 05/18/20 at [facility]

## 2020-07-31 NOTE — Assessment & Plan Note (Signed)
See social history 

## 2020-07-31 NOTE — Progress Notes (Signed)
Subjective:    Patient ID: Sheryl Miller, female    DOB: 1935/11/02, 85 y.o.   MRN: [id number]  HPI Here for Medicare wellness visit and follow up of chronic health conditions This visit occurred during the SARS-CoV-2 public health emergency.  Safety protocols were in place, including screening questions prior to the visit, additional usage of staff PPE, and extensive cleaning of exam room while observing appropriate contact time as indicated for disinfecting solutions.   Reviewed form and advanced directives Reviewed other doctors No alcohol or tobacco Goes to the gym regularly Vision and hearing are okay No falls No depression or anhedonia Independent with instrumental ADLs No memory issues  Abdominal symptoms have resolved  Concerned about runny nose every morning Ongoing blowing nose throughout the day as well Using fexofenadine 180mg  daily--only partially helpful Long standing rhinorrhea flonase in distant past--doesn't remember if it helped No animals Replaced pillows last year---and they wash regularly  Still afraid of fall Balance is off Hasn't needed gabapentin She did stop the simvastatin a while back--didn't affect neuropathy  No recent heartburn No dysphagia--but notes dry mouth at night and can't swallow  Sleeps okay with trazodone nightly  Current Outpatient Medications on File Prior to Visit  Medication Sig Dispense Refill  . brimonidine (ALPHAGAN) 0.2 % ophthalmic solution 1 drop 2 (two) times daily.    . diclofenac Sodium (VOLTAREN) 1 % GEL diclofenac 1 % topical gel    . ketoconazole (NIZORAL) 2 % shampoo Massage into scalp every other day, let sit several minutes before rinsing. 120 mL 0  . mometasone (ELOCON) 0.1 % lotion Apply to affected areas scalp 2 times a day until improved and as needed for recurrence. 60 mL 0  . Multiple Vitamins-Minerals (CENTRUM SILVER ULTRA WOMENS) TABS Take 1 tablet by mouth daily.     . Probiotic Product (PROBIOTIC  PO) Take by mouth.    . simvastatin (ZOCOR) 40 MG tablet TAKE 1 TABLET AT BEDTIME 90 tablet 3  . timolol (TIMOPTIC) 0.5 % ophthalmic solution     . traZODone (DESYREL) 150 MG tablet TAKE 1 TABLET AT BEDTIME AS NEEDED  FOR  SLEEP 90 tablet 3  . triamcinolone (KENALOG) 0.1 % APPLY TO AFFECTED AREAS OF RASH ON BACK 1 TO 2 TIMES DAILY UNTIL IMPROVED. AVOID FACE, GROIN, AXILLA. 80 g 1  . TURMERIC PO Take by mouth.    . gabapentin (NEURONTIN) 100 MG capsule Take 1 capsule (100 mg total) by mouth 2 (two) times daily. 180 capsule 3   No current facility-administered medications on file prior to visit.    No Known Allergies  Past Medical History:  Diagnosis Date  . Allergic rhinitis, cause unspecified   . Breast cyst   . Diverticulosis of colon (without mention of hemorrhage)   . Esophageal reflux   . Female stress incontinence   . Internal hemorrhoids without mention of complication   . Osteoporosis, unspecified   . Other and unspecified hyperlipidemia   . Raynaud's syndrome   . Rheumatic fever   . Unspecified constipation     Past Surgical History:  Procedure Laterality Date  . ABDOMINAL HYSTERECTOMY    . BLADDER REPAIR    . BREAST BIOPSY Right 2000   fibrocystic disease  . KNEE ARTHROSCOPY Right ~5/16  . SHOULDER ARTHROSCOPY WITH ROTATOR CUFF REPAIR AND SUBACROMIAL DECOMPRESSION  08/2001   R, Alphonzo Severance  . SHOULDER SURGERY      In 2006, bilaterally shoulder surgery for rotator cuff.  Marland Kitchen  TONSILLECTOMY    . TUBAL LIGATION      Family History  Problem Relation Age of Onset  . Coronary artery disease Mother   . Heart disease Mother   . Diabetes Mother   . COPD Father   . Breast cancer Sister        2 (age 49?, age 57?); "Gene pos"  . Diabetes Brother   . Breast cancer Maternal Aunt        2  . Stroke Paternal Grandmother   . Diabetes Brother   . Breast cancer Sister   . Breast cancer Daughter        ? at age 36  . Breast cancer Maternal Aunt   . Breast cancer Niece    . Breast cancer Niece     Social History   Socioeconomic History  . Marital status: Married    Spouse name: Not on file  . Number of children: 4  . Years of education: Not on file  . Highest education level: Not on file  Occupational History  . Occupation: retired Scientist, clinical (histocompatibility and immunogenetics): RETIRED  Tobacco Use  . Smoking status: Never Smoker  . Smokeless tobacco: Never Used  Vaping Use  . Vaping Use: Never used  Substance and Sexual Activity  . Alcohol use: No    Alcohol/week: 0.0 standard drinks  . Drug use: No  . Sexual activity: Yes    Birth control/protection: Post-menopausal  Other Topics Concern  . Not on file  Social History Narrative   Regular exercise-yes, walking or rides stationery bike daily      Has living will.    Husband should make health care decisions as needed, then son Herbie [place].    Would accept resuscitation but no prolonged ventilation.    Would not want feeding tube for extended time if not cognitively aware            Social Determinants of Health   Financial Resource Strain: Not on file  Food Insecurity: Not on file  Transportation Needs: Not on file  Physical Activity: Not on file  Stress: Not on file  Social Connections: Not on file  Intimate Partner Violence: Not on file   Review of Systems Appetite is fine Weight is stable Wears seat belt Teeth are fine---keeps up with dentist No suspicious skin lesions--goes for yearly derm visit No chest pain or SOB No dizziness or syncope Bowels are okay with stool softener. No blood Voids okay--no incontinence No sig back or joint pains    Objective:   Physical Exam Constitutional:      Appearance: Normal appearance.  HENT:     Mouth/Throat:     Comments: No lesions Full upper denture Eyes:     Conjunctiva/sclera: Conjunctivae normal.     Pupils: Pupils are equal, round, and reactive to light.  Cardiovascular:     Rate and Rhythm: Normal rate and regular rhythm.     Pulses: Normal  pulses.     Heart sounds: No murmur heard. No gallop.   Pulmonary:     Effort: Pulmonary effort is normal.     Breath sounds: Normal breath sounds. No wheezing or rales.  Abdominal:     Palpations: Abdomen is soft.     Tenderness: There is no abdominal tenderness.  Musculoskeletal:     Cervical back: Neck supple.     Right lower leg: No edema.     Left lower leg: No edema.  Lymphadenopathy:     Cervical: No  cervical adenopathy.  Skin:    General: Skin is warm.     Findings: No rash.  Neurological:     Mental Status: She is alert and oriented to person, place, and time.     Comments: President---"Biden, Trump, Obama" 100-93-4, no 12 [id number] Recall 3/3  Psychiatric:        Mood and Affect: Mood normal.        Behavior: Behavior normal.            Assessment & Plan:

## 2020-07-31 NOTE — Assessment & Plan Note (Signed)
Does okay with the trazodone nightly

## 2020-07-31 NOTE — Assessment & Plan Note (Signed)
Mostly has sensitivity in calves and hands Not using the gabapentin Discussed cane or walking stick to help with balance

## 2020-07-31 NOTE — Assessment & Plan Note (Signed)
Symptoms persist despite the fexofenadine Discussed adding fluticasone nasal spray

## 2020-07-31 NOTE — Assessment & Plan Note (Signed)
I have personally reviewed the Medicare Annual Wellness questionnaire and have noted 1. The patient's medical and social history 2. Their use of alcohol, tobacco or illicit drugs 3. Their current medications and supplements 4. The patient's functional ability including ADL's, fall risks, home safety risks and hearing or visual             impairment. 5. Diet and physical activities 6. Evidence for depression or mood disorders  The patients weight, height, BMI and visual acuity have been recorded in the chart I have made referrals, counseling and provided education to the patient based review of the above and I have provided the pt with a written personalized care plan for preventive services.  I have provided you with a copy of your personalized plan for preventive services. Please take the time to review along with your updated medication list.  Done with cancer screening  UTD with COVID Flu vaccine in the fall Td if any injury Going to the gym regularly now

## 2020-07-31 NOTE — Patient Instructions (Signed)
Please start fluticasone nasal spray for your runny nose---2 sprays in each nostril daily (best value is to get it at [facility]).

## 2020-07-31 NOTE — Assessment & Plan Note (Signed)
Continues on the simvastatin for primary prevention

## 2020-08-01 ENCOUNTER — Other Ambulatory Visit: Payer: Self-pay | Admitting: Internal Medicine

## 2020-08-01 DIAGNOSIS — E871 Hypo-osmolality and hyponatremia: Secondary | ICD-10-CM

## 2020-08-24 ENCOUNTER — Other Ambulatory Visit (INDEPENDENT_AMBULATORY_CARE_PROVIDER_SITE_OTHER): Payer: Medicare HMO

## 2020-08-24 ENCOUNTER — Other Ambulatory Visit: Payer: Self-pay

## 2020-08-24 ENCOUNTER — Ambulatory Visit: Payer: Medicare HMO | Admitting: Internal Medicine

## 2020-08-24 DIAGNOSIS — E871 Hypo-osmolality and hyponatremia: Secondary | ICD-10-CM | POA: Diagnosis not present

## 2020-08-24 LAB — RENAL FUNCTION PANEL
Albumin: 4.2 g/dL (ref 3.5–5.2)
BUN: 9 mg/dL (ref 6–23)
CO2: 30 mEq/L (ref 19–32)
Calcium: 9.3 mg/dL (ref 8.4–10.5)
Chloride: 100 mEq/L (ref 96–112)
Creatinine, Ser: 0.63 mg/dL (ref 0.40–1.20)
GFR: 82.24 mL/min (ref >60.00)
Glucose, Bld: 92 mg/dL (ref 70–99)
Phosphorus: 4 mg/dL (ref 2.3–4.6)
Potassium: 4.5 mEq/L (ref 3.5–5.1)
Sodium: 136 mEq/L (ref 135–145)

## 2020-08-28 ENCOUNTER — Ambulatory Visit: Payer: Medicare HMO | Admitting: Dermatology

## 2020-08-28 ENCOUNTER — Other Ambulatory Visit: Payer: Self-pay

## 2020-08-28 DIAGNOSIS — L570 Actinic keratosis: Secondary | ICD-10-CM

## 2020-08-28 DIAGNOSIS — L578 Other skin changes due to chronic exposure to nonionizing radiation: Secondary | ICD-10-CM

## 2020-08-28 DIAGNOSIS — L814 Other melanin hyperpigmentation: Secondary | ICD-10-CM | POA: Diagnosis not present

## 2020-08-28 DIAGNOSIS — I8310 Varicose veins of unspecified lower extremity with inflammation: Secondary | ICD-10-CM

## 2020-08-28 DIAGNOSIS — D229 Melanocytic nevi, unspecified: Secondary | ICD-10-CM

## 2020-08-28 DIAGNOSIS — L219 Seborrheic dermatitis, unspecified: Secondary | ICD-10-CM | POA: Diagnosis not present

## 2020-08-28 DIAGNOSIS — D225 Melanocytic nevi of trunk: Secondary | ICD-10-CM | POA: Diagnosis not present

## 2020-08-28 DIAGNOSIS — D1800 Hemangioma unspecified site: Secondary | ICD-10-CM

## 2020-08-28 DIAGNOSIS — L300 Nummular dermatitis: Secondary | ICD-10-CM | POA: Diagnosis not present

## 2020-08-28 DIAGNOSIS — L821 Other seborrheic keratosis: Secondary | ICD-10-CM | POA: Diagnosis not present

## 2020-08-28 DIAGNOSIS — L720 Epidermal cyst: Secondary | ICD-10-CM | POA: Diagnosis not present

## 2020-08-28 DIAGNOSIS — Z1283 Encounter for screening for malignant neoplasm of skin: Secondary | ICD-10-CM

## 2020-08-28 DIAGNOSIS — L820 Inflamed seborrheic keratosis: Secondary | ICD-10-CM

## 2020-08-28 NOTE — Patient Instructions (Addendum)
Seborrheic Keratosis  What causes seborrheic keratoses? Seborrheic keratoses are harmless, common skin growths that first appear during adult life.  As time goes by, more growths appear.  Some people may develop a large number of them.  Seborrheic keratoses appear on both covered and uncovered body parts.  They are not caused by sunlight.  The tendency to develop seborrheic keratoses can be inherited.  They vary in color from skin-colored to gray, brown, or even black.  They can be either smooth or have a rough, warty surface.   Seborrheic keratoses are superficial and look as if they were stuck on the skin.  Under the microscope this type of keratosis looks like layers upon layers of skin.  That is why at times the top layer may seem to fall off, but the rest of the growth remains and re-grows.    Treatment Seborrheic keratoses do not need to be treated, but can easily be removed in the office.  Seborrheic keratoses often cause symptoms when they rub on clothing or jewelry.  Lesions can be in the way of shaving.  If they become inflamed, they can cause itching, soreness, or burning.  Removal of a seborrheic keratosis can be accomplished by freezing, burning, or surgery. If any spot bleeds, scabs, or grows rapidly, please return to have it checked, as these can be an indication of a skin cancer.  Seborrheic Dermatitis  -  is a chronic persistent rash characterized by pinkness and scaling most commonly of the mid face but also can occur on the scalp (dandruff), ears; mid chest and mid back. It tends to be exacerbated by stress and cooler weather.  People who have neurologic disease may experience new onset or exacerbation of existing seborrheic dermatitis.  The condition is not curable but treatable and can be controlled.   If you have any questions or concerns for your doctor, please call our main line at [phone] and press option 4 to reach your doctor's medical assistant. If no one answers,  please leave a voicemail as directed and we will return your call as soon as possible. Messages left after 4 pm will be answered the following business day.   You may also send [place] a message via [place]. We typically respond to MyChart messages within 1-2 business days.  For prescription refills, please ask your pharmacy to contact our office. Our fax number is [phone].  If you have an urgent issue when the clinic is closed that cannot wait until the next business day, you can page your doctor at the number below.    Please note that while we do our best to be available for urgent issues outside of office hours, we are not available 24/7.   If you have an urgent issue and are unable to reach [place], you may choose to seek medical care at your doctor's office, retail clinic, urgent care center, or emergency room.  If you have a medical emergency, please immediately call 911 or go to the emergency department.  Pager Numbers  - Dr. Nehemiah Massed: [phone]  - Dr. Laurence Ferrari: [phone]  - Dr. Nicole Kindred: [phone]  In the event of inclement weather, please call our main line at [phone] for an update on the status of any delays or closures.  Dermatology Medication Tips: Please keep the boxes that topical medications come in in order to help keep track of the instructions about where and how to use these. Pharmacies typically print the medication instructions only on the boxes and not  directly on the medication tubes.   If your medication is too expensive, please contact our office at [phone] option 4 or send [place] a message through [place].   We are unable to tell what your co-pay for medications will be in advance as this is different depending on your insurance coverage. However, we may be able to find a substitute medication at lower cost or fill out paperwork to get insurance to cover a needed medication.   If a prior authorization is required to get your medication covered by your  insurance company, please allow [place] 1-2 business days to complete this process.  Drug prices often vary depending on where the prescription is filled and some pharmacies may offer cheaper prices.  The website www.goodrx.com contains coupons for medications through different pharmacies. The prices here do not account for what the cost may be with help from insurance (it may be cheaper with your insurance), but the website can give you the price if you did not use any insurance.  - You can print the associated coupon and take it with your prescription to the pharmacy.  - You may also stop by our office during regular business hours and pick up a GoodRx coupon card.  - If you need your prescription sent electronically to a different pharmacy, notify our office through [facility] or by phone at [phone] option 4.

## 2020-08-28 NOTE — Progress Notes (Signed)
Follow-Up Visit   Subjective  Sheryl Miller is a 85 y.o. female who presents for the following: Annual Exam (Patient presents for TBSE. She has a few spots to check on her forehead (irritated), left cheek (picks at), and arm. She has seborrheic dermatitis of the scalp, improved with mometasone solution and ketoconazole 2% shampoo. History of AKs. No history of skin cancer. ).   The following portions of the chart were reviewed this encounter and updated as appropriate:        Review of Systems:  No other skin or systemic complaints except as noted in HPI or Assessment and Plan.  Objective  Well appearing patient in no apparent distress; mood and affect are within normal limits.  A full examination was performed including scalp, head, eyes, ears, nose, lips, neck, chest, axillae, abdomen, back, buttocks, bilateral upper extremities, bilateral lower extremities, hands, feet, fingers, toes, fingernails, and toenails. All findings within normal limits unless otherwise noted below.  Scalp Clear today.  R mid cheek 4.0 mm flesh papule  Left Posterior Upper Arm Pink scaly macule.  R perioral Smooth white papule(s).   Back Pink patches.  Right Temple Erythematous keratotic or waxy stuck-on papule    Assessment & Plan  Skin cancer screening performed today.  Actinic Damage - chronic, secondary to cumulative UV radiation exposure/sun exposure over time - diffuse scaly erythematous macules with underlying dyspigmentation - Recommend daily broad spectrum sunscreen SPF 30+ to sun-exposed areas, reapply every 2 hours as needed.  - Recommend staying in the shade or wearing long sleeves, sun glasses (UVA+UVB protection) and wide brim hats (4-inch brim around the entire circumference of the hat). - Call for new or changing lesions.  Lentigines - Scattered tan macules - Due to sun exposure - Benign-appering, observe - Recommend daily broad spectrum sunscreen SPF 30+ to  sun-exposed areas, reapply every 2 hours as needed. - Call for any changes  Seborrheic Keratoses - Stuck-on, waxy, tan-brown papules and/or plaques  - Benign-appearing - Discussed benign etiology and prognosis. - Observe - Call for any changes  Milia - tiny firm white papules of the right perioral - type of cyst - benign - may be extracted if symptomatic - observe  Melanocytic Nevi - Tan-brown and/or pink-flesh-colored symmetric macules and papules - Benign appearing on exam today - Observation - Call clinic for new or changing moles - Recommend daily use of broad spectrum spf 30+ sunscreen to sun-exposed areas.   Hemangiomas - Red papules - Discussed benign nature - Observe - Call for any changes  Varicose Veins - Dilated blue, purple or red veins at the lower extremities - Reassured - These can be treated by sclerotherapy (a procedure to inject a medicine into the veins to make them disappear) if desired, but the treatment is not covered by insurance -Recommend compression socks daily.  Seborrheic dermatitis Scalp  Improved with treatment Seborrheic Dermatitis  -  is a chronic persistent rash characterized by pinkness and scaling most commonly of the mid face but also can occur on the scalp (dandruff), ears; mid chest and mid back. It tends to be exacerbated by stress and cooler weather.  People who have neurologic disease may experience new onset or exacerbation of existing seborrheic dermatitis.  The condition is not curable but treatable and can be controlled.  Improved.  Continue mometasone solution qd/bid prn itch.   Continue ketoconazole 2% shampoo prn Apply to scalp and let sit several minutes before rinsing.  Related Medications ketoconazole (NIZORAL)  2 % shampoo Massage into scalp every other day, let sit several minutes before rinsing.  mometasone (ELOCON) 0.1 % lotion Apply to affected areas scalp 2 times a day until improved and as needed for  recurrence.  Nevus R mid cheek  vs SK  Benign-appearing.  Observation.  Call clinic for new or changing moles.  Recommend daily use of broad spectrum spf 30+ sunscreen to sun-exposed areas.   AK (actinic keratosis) Left Posterior Upper Arm  Actinic keratoses are precancerous spots that appear secondary to cumulative UV radiation exposure/sun exposure over time. They are chronic with expected duration over 1 year. A portion of actinic keratoses will progress to squamous cell carcinoma of the skin. It is not possible to reliably predict which spots will progress to skin cancer and so treatment is recommended to prevent development of skin cancer.  Recommend daily broad spectrum sunscreen SPF 30+ to sun-exposed areas, reapply every 2 hours as needed.  Recommend staying in the shade or wearing long sleeves, sun glasses (UVA+UVB protection) and wide brim hats (4-inch brim around the entire circumference of the hat). Call for new or changing lesions.  Destruction of lesion - Left Posterior Upper Arm  Destruction method: cryotherapy   Informed consent: discussed and consent obtained   Lesion destroyed using liquid nitrogen: Yes   Region frozen until ice ball extended beyond lesion: Yes   Outcome: patient tolerated procedure well with no complications   Post-procedure details: wound care instructions given   Additional details:  Prior to procedure, discussed risks of blister formation, small wound, skin dyspigmentation, or rare scar following cryotherapy. Recommend Vaseline ointment to treated areas while healing.   Milia R perioral  Irritated.  Acne/Milia surgery - R perioral Procedure risks and benefits were discussed with the patient and verbal consent was obtained. Following prep of the skin on the right perioral x 2 with an alcohol swab and local anesthesia injection, extraction of milia was performed with a comedone extractor following superficial incision made over their surfaces  with a #11 blade. Capillary hemostasis was achieved with 20% aluminum chloride solution. Vaseline ointment was applied to each site. The patient tolerated the procedure well.  Nummular dermatitis Back  Improved Recommend mild soap and moisturizing cream 1-2 times daily.  Gentle skin care handout provided.   Continue TMC 0.1% Cream qd/bid prn flares.  Topical steroids (such as triamcinolone, fluocinolone, fluocinonide, mometasone, clobetasol, halobetasol, betamethasone, hydrocortisone) can cause thinning and lightening of the skin if they are used for too long in the same area. Your physician has selected the right strength medicine for your problem and area affected on the body. Please use your medication only as directed by your physician to prevent side effects.    Inflamed seborrheic keratosis Right Temple  Destruction of lesion - Right Temple  Destruction method: cryotherapy   Informed consent: discussed and consent obtained   Lesion destroyed using liquid nitrogen: Yes   Region frozen until ice ball extended beyond lesion: Yes   Outcome: patient tolerated procedure well with no complications   Post-procedure details: wound care instructions given   Additional details:  Prior to procedure, discussed risks of blister formation, small wound, skin dyspigmentation, or rare scar following cryotherapy. Recommend Vaseline ointment to treated areas while healing.   Return in about 1 year (around 08/28/2021) for TBSE.  Lindi Adie, CMA, am acting as scribe for Brendolyn Patty, MD . Documentation: I have reviewed the above documentation for accuracy and completeness, and I agree with the  above.  Brendolyn Patty MD

## 2020-10-30 DIAGNOSIS — H40001 Preglaucoma, unspecified, right eye: Secondary | ICD-10-CM | POA: Diagnosis not present

## 2020-12-18 ENCOUNTER — Other Ambulatory Visit: Payer: Self-pay | Admitting: Internal Medicine

## 2021-02-12 ENCOUNTER — Other Ambulatory Visit: Payer: Self-pay | Admitting: Internal Medicine

## 2021-02-13 ENCOUNTER — Encounter: Payer: Self-pay | Admitting: Internal Medicine

## 2021-02-13 ENCOUNTER — Other Ambulatory Visit: Payer: Self-pay

## 2021-02-13 ENCOUNTER — Ambulatory Visit (INDEPENDENT_AMBULATORY_CARE_PROVIDER_SITE_OTHER): Payer: Medicare HMO | Admitting: Internal Medicine

## 2021-02-13 DIAGNOSIS — Z803 Family history of malignant neoplasm of breast: Secondary | ICD-10-CM

## 2021-02-13 NOTE — Progress Notes (Signed)
Subjective:    Patient ID: Sheryl Miller, female    DOB: 1935-03-25, 85 y.o.   MRN: [id number]  HPI Here to discuss need for another mammogram This visit occurred during the SARS-CoV-2 public health emergency.  Safety protocols were in place, including screening questions prior to the visit, additional usage of staff PPE, and extensive cleaning of exam room while observing appropriate contact time as indicated for disinfecting solutions.   She had a mammogram about a year ago Now wondering if she should get any more (I had generally recommended against it) Strong family history of breast cancer---sister twice, other sister once, cousin and 3-4 aunts and some of their children  Current Outpatient Medications on File Prior to Visit  Medication Sig Dispense Refill   brimonidine (ALPHAGAN) 0.2 % ophthalmic solution 1 drop 2 (two) times daily.     diclofenac Sodium (VOLTAREN) 1 % GEL diclofenac 1 % topical gel     fexofenadine (ALLEGRA) 180 MG tablet Take 180 mg by mouth daily.     ketoconazole (NIZORAL) 2 % shampoo Massage into scalp every other day, let sit several minutes before rinsing. 120 mL 0   mometasone (ELOCON) 0.1 % lotion Apply to affected areas scalp 2 times a day until improved and as needed for recurrence. 60 mL 0   Multiple Vitamins-Minerals (CENTRUM SILVER ULTRA WOMENS) TABS Take 1 tablet by mouth daily.      Probiotic Product (PROBIOTIC PO) Take by mouth.     simvastatin (ZOCOR) 40 MG tablet TAKE 1 TABLET AT BEDTIME 90 tablet 3   timolol (TIMOPTIC) 0.5 % ophthalmic solution      traZODone (DESYREL) 150 MG tablet TAKE 1 TABLET AT BEDTIME AS NEEDED  FOR  SLEEP 90 tablet 3   triamcinolone (KENALOG) 0.1 % APPLY TO AFFECTED AREAS OF RASH ON BACK 1 TO 2 TIMES DAILY UNTIL IMPROVED. AVOID FACE, GROIN, AXILLA. 80 g 1   TURMERIC PO Take by mouth.     No current facility-administered medications on file prior to visit.    No Known Allergies  Past Medical History:  Diagnosis  Date   Allergic rhinitis, cause unspecified    Breast cyst    Diverticulosis of colon (without mention of hemorrhage)    Esophageal reflux    Female stress incontinence    Internal hemorrhoids without mention of complication    Osteoporosis, unspecified    Other and unspecified hyperlipidemia    Raynaud's syndrome    Rheumatic fever    Unspecified constipation     Past Surgical History:  Procedure Laterality Date   ABDOMINAL HYSTERECTOMY     BLADDER REPAIR     BREAST BIOPSY Right 2000   fibrocystic disease   KNEE ARTHROSCOPY Right ~5/16   SHOULDER ARTHROSCOPY WITH ROTATOR CUFF REPAIR AND SUBACROMIAL DECOMPRESSION  08/2001   R, Scott Dean   SHOULDER SURGERY      In 2006, bilaterally shoulder surgery for rotator cuff.   TONSILLECTOMY     TUBAL LIGATION      Family History  Problem Relation Age of Onset   Coronary artery disease Mother    Heart disease Mother    Diabetes Mother    COPD Father    Breast cancer Sister        2 (age 95?, age 63?); "Gene pos"   Diabetes Brother    Breast cancer Maternal Aunt        2   Stroke Paternal Grandmother    Diabetes Brother  Breast cancer Sister    Breast cancer Daughter        ? at age 53   Breast cancer Maternal Aunt    Breast cancer Niece    Breast cancer Niece     Social History   Socioeconomic History   Marital status: Married    Spouse name: Not on file   Number of children: 4   Years of education: Not on file   Highest education level: Not on file  Occupational History   Occupation: retired Scientist, clinical (histocompatibility and immunogenetics): RETIRED  Tobacco Use   Smoking status: Never   Smokeless tobacco: Never  Vaping Use   Vaping Use: Never used  Substance and Sexual Activity   Alcohol use: No    Alcohol/week: 0.0 standard drinks   Drug use: No   Sexual activity: Yes    Birth control/protection: Post-menopausal  Other Topics Concern   Not on file  Social History Narrative   Regular exercise-yes, walking or rides stationery bike  daily      Has living will.    Husband should make health care decisions as needed, then son Herbie [place].    Would accept resuscitation but no prolonged ventilation.    Would not want feeding tube for extended time if not cognitively aware            Social Determinants of Health   Financial Resource Strain: Not on file  Food Insecurity: Not on file  Transportation Needs: Not on file  Physical Activity: Not on file  Stress: Not on file  Social Connections: Not on file  Intimate Partner Violence: Not on file   Review of Systems     Objective:   Physical Exam Constitutional:      Appearance: Normal appearance.  Neurological:     Mental Status: She is alert.  Psychiatric:     Comments: Mildly anxious           Assessment & Plan:

## 2021-02-13 NOTE — Assessment & Plan Note (Signed)
She has strong FH but is not aware if anyone has been tested for BRCA gene She is very anxious about this Given her concerns, I think she should proceed with mammogram. If early stage cancer found, she be very careful about any Rx other than lumpectomy.

## 2021-02-20 ENCOUNTER — Other Ambulatory Visit: Payer: Self-pay | Admitting: Internal Medicine

## 2021-02-20 DIAGNOSIS — Z1231 Encounter for screening mammogram for malignant neoplasm of breast: Secondary | ICD-10-CM

## 2021-03-01 ENCOUNTER — Other Ambulatory Visit: Payer: Self-pay

## 2021-03-01 ENCOUNTER — Ambulatory Visit
Admission: RE | Admit: 2021-03-01 | Discharge: 2021-03-01 | Disposition: A | Discharge status: Home / Self Care | Payer: Medicare HMO | Source: Ambulatory Visit | Attending: Internal Medicine | Admitting: Internal Medicine

## 2021-03-01 DIAGNOSIS — Z1231 Encounter for screening mammogram for malignant neoplasm of breast: Secondary | ICD-10-CM | POA: Diagnosis not present

## 2021-03-05 ENCOUNTER — Other Ambulatory Visit: Payer: Self-pay | Admitting: Internal Medicine

## 2021-03-05 ENCOUNTER — Encounter: Payer: Self-pay | Admitting: Internal Medicine

## 2021-03-05 DIAGNOSIS — R928 Other abnormal and inconclusive findings on diagnostic imaging of breast: Secondary | ICD-10-CM

## 2021-03-05 DIAGNOSIS — N6310 Unspecified lump in the right breast, unspecified quadrant: Secondary | ICD-10-CM

## 2021-03-18 ENCOUNTER — Other Ambulatory Visit: Payer: Self-pay | Admitting: Internal Medicine

## 2021-03-18 ENCOUNTER — Other Ambulatory Visit: Payer: Self-pay | Admitting: Dermatology

## 2021-03-18 DIAGNOSIS — L219 Seborrheic dermatitis, unspecified: Secondary | ICD-10-CM

## 2021-03-19 ENCOUNTER — Ambulatory Visit
Admission: RE | Admit: 2021-03-19 | Discharge: 2021-03-19 | Disposition: A | Discharge status: Home / Self Care | Payer: Medicare HMO | Source: Ambulatory Visit | Attending: Internal Medicine | Admitting: Internal Medicine

## 2021-03-19 ENCOUNTER — Other Ambulatory Visit: Payer: Self-pay | Admitting: Dermatology

## 2021-03-19 ENCOUNTER — Other Ambulatory Visit: Payer: Self-pay

## 2021-03-19 DIAGNOSIS — N6310 Unspecified lump in the right breast, unspecified quadrant: Secondary | ICD-10-CM | POA: Diagnosis not present

## 2021-03-19 DIAGNOSIS — L219 Seborrheic dermatitis, unspecified: Secondary | ICD-10-CM

## 2021-03-19 DIAGNOSIS — R922 Inconclusive mammogram: Secondary | ICD-10-CM | POA: Diagnosis not present

## 2021-03-19 DIAGNOSIS — R928 Other abnormal and inconclusive findings on diagnostic imaging of breast: Secondary | ICD-10-CM | POA: Diagnosis not present

## 2021-03-26 ENCOUNTER — Encounter: Payer: Self-pay | Admitting: Internal Medicine

## 2021-04-03 ENCOUNTER — Other Ambulatory Visit: Payer: Self-pay

## 2021-04-03 ENCOUNTER — Ambulatory Visit (INDEPENDENT_AMBULATORY_CARE_PROVIDER_SITE_OTHER): Payer: Medicare HMO | Admitting: Internal Medicine

## 2021-04-03 ENCOUNTER — Encounter: Payer: Self-pay | Admitting: Internal Medicine

## 2021-04-03 DIAGNOSIS — R2689 Other abnormalities of gait and mobility: Secondary | ICD-10-CM | POA: Diagnosis not present

## 2021-04-03 NOTE — Progress Notes (Signed)
Subjective:    Patient ID: Sheryl Miller, female    DOB: 05-08-1935, 86 y.o.   MRN: [id number]  HPI Here with concerns about her  balance  "My balance is getting bad" Feels it is about the same--or slightly worse Has to hang on to husband at times Hasn't tried cane  No foot pain No worsening of neuropathy  Sleeps okay---uses the trazodone and melatonin at  Awakens refreshed--no AM grogginess  Current Outpatient Medications on File Prior to Visit  Medication Sig Dispense Refill   brimonidine (ALPHAGAN) 0.2 % ophthalmic solution 1 drop 2 (two) times daily.     diclofenac Sodium (VOLTAREN) 1 % GEL diclofenac 1 % topical gel     fexofenadine (ALLEGRA) 180 MG tablet Take 180 mg by mouth daily.     ketoconazole (NIZORAL) 2 % shampoo MASSAGE INTO SCALP EVERY OTHER DAY, LET SIT SEVERAL MINUTES BEFORE RINSING. 120 mL 3   mometasone (ELOCON) 0.1 % lotion APPLY TO AFFECTED AREAS OF SCALP TWICE DAILY UNTIL IMPROVED AND AS NEEDED FOR RECURRENCE. 60 mL 0   Multiple Vitamins-Minerals (CENTRUM SILVER ULTRA WOMENS) TABS Take 1 tablet by mouth daily.      Probiotic Product (PROBIOTIC PO) Take by mouth.     simvastatin (ZOCOR) 40 MG tablet TAKE 1 TABLET AT BEDTIME 90 tablet 3   timolol (TIMOPTIC) 0.5 % ophthalmic solution      traZODone (DESYREL) 150 MG tablet TAKE 1 TABLET AT BEDTIME AS NEEDED  FOR  SLEEP 90 tablet 3   triamcinolone (KENALOG) 0.1 % APPLY TO AFFECTED AREAS OF RASH ON BACK 1 TO 2 TIMES DAILY UNTIL IMPROVED. AVOID FACE, GROIN, AXILLA. 80 g 1   TURMERIC PO Take by mouth.     No current facility-administered medications on file prior to visit.    No Known Allergies  Past Medical History:  Diagnosis Date   Allergic rhinitis, cause unspecified    Breast cyst    Diverticulosis of colon (without mention of hemorrhage)    Esophageal reflux    Female stress incontinence    Internal hemorrhoids without mention of complication    Osteoporosis, unspecified    Other and  unspecified hyperlipidemia    Raynaud's syndrome    Rheumatic fever    Unspecified constipation     Past Surgical History:  Procedure Laterality Date   ABDOMINAL HYSTERECTOMY     BLADDER REPAIR     BREAST BIOPSY Right 2000   fibrocystic disease   KNEE ARTHROSCOPY Right ~5/16   SHOULDER ARTHROSCOPY WITH ROTATOR CUFF REPAIR AND SUBACROMIAL DECOMPRESSION  08/2001   R, Scott Dean   SHOULDER SURGERY      In 2006, bilaterally shoulder surgery for rotator cuff.   TONSILLECTOMY     TUBAL LIGATION      Family History  Problem Relation Age of Onset   Coronary artery disease Mother    Heart disease Mother    Diabetes Mother    COPD Father    Breast cancer Sister        2 (age 64?, age 20?); "Gene pos"   Diabetes Brother    Breast cancer Maternal Aunt        2   Stroke Paternal Grandmother    Diabetes Brother    Breast cancer Sister    Breast cancer Daughter        ? at age 84   Breast cancer Maternal Aunt    Breast cancer Niece    Breast cancer Niece  Social History   Socioeconomic History   Marital status: Married    Spouse name: Not on file   Number of children: 4   Years of education: Not on file   Highest education level: Not on file  Occupational History   Occupation: retired Scientist, clinical (histocompatibility and immunogenetics): RETIRED  Tobacco Use   Smoking status: Never   Smokeless tobacco: Never  Vaping Use   Vaping Use: Never used  Substance and Sexual Activity   Alcohol use: No    Alcohol/week: 0.0 standard drinks   Drug use: No   Sexual activity: Yes    Birth control/protection: Post-menopausal  Other Topics Concern   Not on file  Social History Narrative   Regular exercise-yes, walking or rides stationery bike daily      Has living will.    Husband should make health care decisions as needed, then son Herbie [place].    Would accept resuscitation but no prolonged ventilation.    Would not want feeding tube for extended time if not cognitively aware            Social Determinants  of Health   Financial Resource Strain: Not on file  Food Insecurity: Not on file  Transportation Needs: Not on file  Physical Activity: Not on file  Stress: Not on file  Social Connections: Not on file  Intimate Partner Violence: Not on file   Review of Systems Legs are "sore"---but not weak Some left hip pain with walking     Objective:   Physical Exam Constitutional:      Appearance: Normal appearance.  Musculoskeletal:     Cervical back: Neck supple.     Comments: Fairly normal ROM in hips Left leg seems to be longer  Lymphadenopathy:     Cervical: No cervical adenopathy.  Neurological:     Mental Status: She is alert.     Comments: Mildly antalgic gait with left leg stiffness (seemed to be hip gait but hip was fine) No leg weakness           Assessment & Plan:

## 2021-04-03 NOTE — Assessment & Plan Note (Signed)
Probably multifactorial Some degree of neuropathy--but she downplays this Seems to have leg length discrepancy and has abnormal gait (suggesting hip pathology but not found on exam). She resists idea of a lift---but I think that might help  Will set up with Dr Lorelei Pont for evaluation

## 2021-04-03 NOTE — Progress Notes (Signed)
Rich, I am happy to see her.  The appointment issues have been challenging for all of [place].   Butch Penny, can you help her get set up with me when we are back?

## 2021-04-03 NOTE — Progress Notes (Signed)
Patient is already scheduled to see Dr. Lorelei Pont on 04/22/2021 at [facility].

## 2021-04-21 NOTE — Progress Notes (Signed)
Gurshan Settlemire T. Mahum Betten, MD, CAQ Sports Medicine [facility] at [facility] [address] [place], [address]  Phone: [phone]   FAX: [phone]  Sheryl Miller - 86 y.o. female   MRN [id number]   Date of Birth: [birth year]  Date: 04/22/2021   PCP: Karie Schwalbe, MD   Referral: Karie Schwalbe, MD  Chief Complaint  Patient presents with   Follow-up    Here for 2 wk balance problem f/u.    This visit occurred during the SARS-CoV-2 public health emergency.  Safety protocols were in place, including screening questions prior to the visit, additional usage of staff PPE, and extensive cleaning of exam room while observing appropriate contact time as indicated for disinfecting solutions.   Subjective:   Sheryl Miller is a 86 y.o. very pleasant female patient with Body mass index is 28.82 kg/m. who presents with the following:  This is a new consultation for back pain, while this is described as a 2-week follow-up on the appointment list, not seen the patient for this condition.  She presents with some ongoing issues of some balance, but there is also question of some leg length discrepancy, and also for discussion of back pain as well as hip pain.  Walking to the left.  Since last year.  Loses balance - no pain.  This is mostly with ambulation and walking. She has had some intermittent leg pain is bothering her, but this is been present for years and has not really changed.  She is not having any radicular symptoms, numbness, tingling.  She does not have any pain in the deep groin.  Review of Systems is noted in the HPI, as appropriate  Objective:   BP 126/62    Pulse 65    Temp 97.7 F (36.5 C) (Temporal)    Ht 5' 1.75" (1.568 m)    Wt 156 lb 5 oz (70.9 kg)    SpO2 95%    BMI 28.82 kg/m   GEN: No acute distress; alert,appropriate. PULM: Breathing comfortably in no respiratory distress PSYCH: Normally interactive.    Range of motion at   the waist: Flexion, extension, lateral bending and rotation: Full  No echymosis or edema Rises to examination table with mild difficulty Gait: minimally antalgic  Inspection/Deformity: N Paraspinus Tenderness: Minimal to none  B Ankle Dorsiflexion (L5,4): 5/5 B Great Toe Dorsiflexion (L5,4): 5/5 Ankle plantarflexion, 5/5 Rise/Squat (L4): WNL, mild pain  SENSORY B Medial Foot (L4): WNL B Dorsum (L5): WNL B Lateral (S1): WNL Light Touch: WNL Pinprick: WNL  REFLEXES Knee (L4): 2+ Ankle (S1): 2+  B SLR, seated: neg B SLR, supine: neg B FABER: neg B Reverse FABER: neg B Greater Troch: NT B Log Roll: neg B Sciatic Notch: NT    HIP EXAM: SIDE: Bilateral ROM: Abduction, Flexion, Internal and External range of motion: Minimal to no limitation Pain with terminal IROM and EROM: None Piriformis: NT at direct palpation Str: flexion: 3/5 abduction: 3+/5 adduction: 3+/5 Hip weakness bilateral Strength testing non-tender    She does have a roughly 1 cm leg length discrepancy.  When ambulating a straight line she does have notable unsteadiness of hips, and she does also appear mildly unstable.  Laboratory and Imaging Data:  Assessment and Plan:     ICD-10-CM   1. Gait abnormality  R26.9 Ambulatory referral to Physical Therapy    2. Balance problem  R26.89 Ambulatory referral to Physical Therapy    3. Weakness  of both hips  R29.898 Ambulatory referral to Physical Therapy     Total encounter time: 30 minutes. This includes total time spent on the day of encounter.  Additional time spent on chart review prior to face-to-face.  I think that the big thing here is that her gait is off, and her hips are dramatically weak.  It is difficult to know if her gait is off due to her hips or her hips or we do to try for her abnormal gait.  Nevertheless, virtually no one can walk normally with such weakness about the lower extremities and hips.  Maybe she would greatly benefit from some  rehabilitation for strengthening, hip stability, and gait training.  We reviewed some basic things for her to work on now.  Think that this is the primary issue not a modest leg length discrepancy.  I do not want to have her take any additional medication.  I would anticipate that she would have some significant improvement post therapy.  No orders of the defined types were placed in this encounter.  There are no discontinued medications. Orders Placed This Encounter  Procedures   Ambulatory referral to Physical Therapy    Follow-up: No follow-ups on file.  [employer] speech-to-text software was used for transcription in this dictation.  Possible transcriptional errors can occur using Animal nutritionist.   Signed,  Elpidio Galea. Shalin Vonbargen, MD   Outpatient Encounter Medications as of 04/22/2021  Medication Sig   brimonidine (ALPHAGAN) 0.2 % ophthalmic solution 1 drop 2 (two) times daily.   diclofenac Sodium (VOLTAREN) 1 % GEL diclofenac 1 % topical gel   fexofenadine (ALLEGRA) 180 MG tablet Take 180 mg by mouth daily.   ketoconazole (NIZORAL) 2 % shampoo MASSAGE INTO SCALP EVERY OTHER DAY, LET SIT SEVERAL MINUTES BEFORE RINSING.   mometasone (ELOCON) 0.1 % lotion APPLY TO AFFECTED AREAS OF SCALP TWICE DAILY UNTIL IMPROVED AND AS NEEDED FOR RECURRENCE.   Multiple Vitamins-Minerals (CENTRUM SILVER ULTRA WOMENS) TABS Take 1 tablet by mouth daily.    POTASSIUM PO Take by mouth daily.   Probiotic Product (PROBIOTIC PO) Take by mouth.   timolol (TIMOPTIC) 0.5 % ophthalmic solution    traZODone (DESYREL) 150 MG tablet TAKE 1 TABLET AT BEDTIME AS NEEDED  FOR  SLEEP   triamcinolone (KENALOG) 0.1 % APPLY TO AFFECTED AREAS OF RASH ON BACK 1 TO 2 TIMES DAILY UNTIL IMPROVED. AVOID FACE, GROIN, AXILLA.   TURMERIC PO Take by mouth.   [DISCONTINUED] simvastatin (ZOCOR) 40 MG tablet TAKE 1 TABLET AT BEDTIME   No facility-administered encounter medications on file as of 04/22/2021.

## 2021-04-22 ENCOUNTER — Other Ambulatory Visit: Payer: Self-pay | Admitting: Internal Medicine

## 2021-04-22 ENCOUNTER — Ambulatory Visit (INDEPENDENT_AMBULATORY_CARE_PROVIDER_SITE_OTHER): Payer: Medicare HMO | Admitting: Family Medicine

## 2021-04-22 ENCOUNTER — Other Ambulatory Visit: Payer: Self-pay

## 2021-04-22 VITALS — BP 126/62 | HR 65 | Temp 97.7°F | Ht 61.75 in | Wt 156.3 lb

## 2021-04-22 DIAGNOSIS — R29898 Other symptoms and signs involving the musculoskeletal system: Secondary | ICD-10-CM

## 2021-04-22 DIAGNOSIS — R269 Unspecified abnormalities of gait and mobility: Secondary | ICD-10-CM | POA: Diagnosis not present

## 2021-04-22 DIAGNOSIS — R2689 Other abnormalities of gait and mobility: Secondary | ICD-10-CM

## 2021-04-23 ENCOUNTER — Encounter: Payer: Self-pay | Admitting: Family Medicine

## 2021-05-04 ENCOUNTER — Encounter: Payer: Self-pay | Admitting: Emergency Medicine

## 2021-05-04 ENCOUNTER — Other Ambulatory Visit: Payer: Self-pay

## 2021-05-04 ENCOUNTER — Ambulatory Visit
Admission: EM | Admit: 2021-05-04 | Discharge: 2021-05-04 | Disposition: A | Discharge status: Home / Self Care | Payer: Medicare HMO

## 2021-05-04 DIAGNOSIS — R519 Headache, unspecified: Secondary | ICD-10-CM

## 2021-05-04 NOTE — ED Triage Notes (Addendum)
Pt here with ongoing headache and fullness in head since having a mechanical fall a little over 2 weeks ago. Pt hit the wall hard enough with the top of her head to make a hole in the dry wall. Denies nausea, changes in vision, and dizziness. No blood thinners.

## 2021-05-04 NOTE — ED Provider Notes (Signed)
Roderic Palau    CSN: [id number] Arrival date & time: 05/04/21  1231      History   Chief Complaint Chief Complaint  Patient presents with   Fall   Headache    HPI Sheryl Miller is a 86 y.o. female.   Pt complains of intermittent headaches after she tripped and hit her head on the wall a little over two weeks ago.  She denies n/v, visual changes, changes with balance, fatigue, dizziness.  She is not on blood thinners.  She has been taking Tylenol with temporary relief.    Past Medical History:  Diagnosis Date   Allergic rhinitis, cause unspecified    Breast cyst    Diverticulosis of colon (without mention of hemorrhage)    Esophageal reflux    Female stress incontinence    Internal hemorrhoids without mention of complication    Osteoporosis, unspecified    Other and unspecified hyperlipidemia    Raynaud's syndrome    Rheumatic fever    Unspecified constipation     Patient Active Problem List   Diagnosis Date Noted   Neuropathy 10/29/2018   Generalized osteoarthritis 07/10/2017   Advance directive discussed with patient 02/13/2016   Family history of breast cancer 07/20/2015   Hyperlipemia 02/12/2015   Sleep disturbance 04/20/2014   Balance problem 01/26/2013   Routine general medical examination at a health care facility 08/16/2010   PES PLANUS 02/11/2010   UNEQUAL LEG LENGTH 02/11/2010   VARICOSE VEINS, LOWER EXTREMITIES 12/01/2007   Osteoporosis 06/24/2007   HEMORRHOIDS, INTERNAL 06/07/2007   Chronic constipation 06/07/2007   GERD 02/11/2007   Allergic rhinitis due to pollen 07/09/2006    Past Surgical History:  Procedure Laterality Date   ABDOMINAL HYSTERECTOMY     BLADDER REPAIR     BREAST BIOPSY Right 2000   fibrocystic disease   KNEE ARTHROSCOPY Right ~5/16   SHOULDER ARTHROSCOPY WITH ROTATOR CUFF REPAIR AND SUBACROMIAL DECOMPRESSION  08/2001   R, Scott Dean   SHOULDER SURGERY      In 2006, bilaterally shoulder surgery for rotator  cuff.   TONSILLECTOMY     TUBAL LIGATION      OB History     Gravida  4   Para  4   Term      Preterm      AB      Living  4      SAB      IAB      Ectopic      Multiple      Live Births           Obstetric Comments  1st Menstrual Cycle: 14 1st Pregnancy: 23          Home Medications    Prior to Admission medications   Medication Sig Start Date End Date Taking? Authorizing Provider  brimonidine (ALPHAGAN) 0.2 % ophthalmic solution 1 drop 2 (two) times daily. 08/03/19   [provider]  diclofenac Sodium (VOLTAREN) 1 % GEL diclofenac 1 % topical gel    [provider]  fexofenadine (ALLEGRA) 180 MG tablet Take 180 mg by mouth daily.    [provider]  ketoconazole (NIZORAL) 2 % shampoo MASSAGE INTO SCALP EVERY OTHER DAY, LET SIT SEVERAL MINUTES BEFORE RINSING. 03/18/21   Brendolyn Patty, MD  mometasone (ELOCON) 0.1 % lotion APPLY TO AFFECTED AREAS OF SCALP TWICE DAILY UNTIL IMPROVED AND AS NEEDED FOR RECURRENCE. 03/19/21   Ralene Bathe, MD  Multiple Vitamins-Minerals (CENTRUM  SILVER ULTRA WOMENS) TABS Take 1 tablet by mouth daily.     [provider]  POTASSIUM PO Take by mouth daily.    [provider]  Probiotic Product (PROBIOTIC PO) Take by mouth.    [provider]  simvastatin (ZOCOR) 40 MG tablet TAKE 1 TABLET AT BEDTIME 04/22/21   Viviana Simpler I, MD  timolol (TIMOPTIC) 0.5 % ophthalmic solution  07/26/19   [provider]  traZODone (DESYREL) 150 MG tablet TAKE 1 TABLET AT BEDTIME AS NEEDED  FOR  SLEEP 03/18/21   Viviana Simpler I, MD  triamcinolone (KENALOG) 0.1 % APPLY TO AFFECTED AREAS OF RASH ON BACK 1 TO 2 TIMES DAILY UNTIL IMPROVED. AVOID FACE, GROIN, AXILLA. 01/30/20   Brendolyn Patty, MD  TURMERIC PO Take by mouth.    [provider]    Family History Family History  Problem Relation Age of Onset   Coronary artery disease Mother    Heart disease Mother    Diabetes  Mother    COPD Father    Breast cancer Sister        2 (age 28?, age 96?); "Gene pos"   Diabetes Brother    Breast cancer Maternal Aunt        2   Stroke Paternal Grandmother    Diabetes Brother    Breast cancer Sister    Breast cancer Daughter        ? at age 3   Breast cancer Maternal Aunt    Breast cancer Niece    Breast cancer Niece     Social History Social History   Tobacco Use   Smoking status: Never   Smokeless tobacco: Never  Vaping Use   Vaping Use: Never used  Substance Use Topics   Alcohol use: No    Alcohol/week: 0.0 standard drinks   Drug use: No     Allergies   Patient has no known allergies.   Review of Systems Review of Systems  Constitutional:  Negative for chills and fever.  HENT:  Negative for ear pain and sore throat.   Eyes:  Negative for pain and visual disturbance.  Respiratory:  Negative for cough and shortness of breath.   Cardiovascular:  Negative for chest pain and palpitations.  Gastrointestinal:  Negative for abdominal pain and vomiting.  Genitourinary:  Negative for dysuria and hematuria.  Musculoskeletal:  Negative for arthralgias and back pain.  Skin:  Negative for color change and rash.  Neurological:  Positive for headaches. Negative for seizures and syncope.  All other systems reviewed and are negative.   Physical Exam Triage Vital Signs ED Triage Vitals  Enc Vitals Group     BP 05/04/21 1250 (!) 155/84     Pulse Rate 05/04/21 1250 65     Resp 05/04/21 1250 18     Temp 05/04/21 1250 98.4 F (36.9 C)     Temp src --      SpO2 05/04/21 1250 97 %     Weight --      Height --      Head Circumference --      Peak Flow --      Pain Score 05/04/21 1303 5     Pain Loc --      Pain Edu? --      Excl. in [place]? --    No data found.  Updated Vital Signs BP (!) 155/84    Pulse 65    Temp 98.4 F (36.9 C)  Resp 18    SpO2 97%   Visual Acuity Right Eye Distance:   Left Eye Distance:   Bilateral Distance:    Right  Eye Near:   Left Eye Near:    Bilateral Near:     Physical Exam Vitals and nursing note reviewed.  Constitutional:      General: She is not in acute distress.    Appearance: She is well-developed.  HENT:     Head: Normocephalic and atraumatic.  Eyes:     Conjunctiva/sclera: Conjunctivae normal.  Cardiovascular:     Rate and Rhythm: Normal rate and regular rhythm.     Heart sounds: No murmur heard. Pulmonary:     Effort: Pulmonary effort is normal. No respiratory distress.     Breath sounds: Normal breath sounds.  Abdominal:     Palpations: Abdomen is soft.     Tenderness: There is no abdominal tenderness.  Musculoskeletal:        General: No swelling.     Cervical back: Neck supple.  Skin:    General: Skin is warm and dry.     Capillary Refill: Capillary refill takes less than 2 seconds.  Neurological:     Mental Status: She is alert.  Psychiatric:        Mood and Affect: Mood normal.     UC Treatments / Results  Labs (all labs ordered are listed, but only abnormal results are displayed) Labs Reviewed - No data to display  EKG   Radiology No results found.  Procedures Procedures (including critical care time)  Medications Ordered in UC Medications - No data to display  Initial Impression / Assessment and Plan / UC Course  I have reviewed the triage vital signs and the nursing notes.  Pertinent labs & imaging results that were available during my care of the patient were reviewed by me and considered in my medical decision making (see chart for details).     Headaches after hitting her head.  Overall pt well appearing.  Nuero exam normal, no bruising noted.  At this time imaging not indicated.  Advised follow up with PCP.  Return precautions discussed.  Final Clinical Impressions(s) / UC Diagnoses   Final diagnoses:  Acute nonintractable headache, unspecified headache type     Discharge Instructions      Continue with Tylenol as needed,  rest Follow up with Primary Care Physician     ED Prescriptions   None    PDMP not reviewed this encounter.   Ward, Lenise Arena, PA-C 05/04/21 1320

## 2021-05-04 NOTE — Discharge Instructions (Addendum)
Continue with Tylenol as needed, rest Follow up with Primary Care Physician

## 2021-05-06 DIAGNOSIS — H401122 Primary open-angle glaucoma, left eye, moderate stage: Secondary | ICD-10-CM | POA: Diagnosis not present

## 2021-05-17 ENCOUNTER — Other Ambulatory Visit: Payer: Self-pay

## 2021-05-17 ENCOUNTER — Ambulatory Visit: Payer: Medicare HMO | Attending: Family Medicine

## 2021-05-17 VITALS — BP 118/55 | HR 65 | Resp 17 | Ht 62.0 in | Wt 154.0 lb

## 2021-05-17 DIAGNOSIS — R29898 Other symptoms and signs involving the musculoskeletal system: Secondary | ICD-10-CM | POA: Diagnosis not present

## 2021-05-17 DIAGNOSIS — R2689 Other abnormalities of gait and mobility: Secondary | ICD-10-CM | POA: Diagnosis not present

## 2021-05-17 DIAGNOSIS — M6281 Muscle weakness (generalized): Secondary | ICD-10-CM | POA: Diagnosis not present

## 2021-05-17 DIAGNOSIS — R262 Difficulty in walking, not elsewhere classified: Secondary | ICD-10-CM | POA: Insufficient documentation

## 2021-05-17 DIAGNOSIS — R2681 Unsteadiness on feet: Secondary | ICD-10-CM | POA: Diagnosis not present

## 2021-05-17 DIAGNOSIS — R269 Unspecified abnormalities of gait and mobility: Secondary | ICD-10-CM | POA: Diagnosis not present

## 2021-05-17 NOTE — Therapy (Signed)
[place] MAIN [facility] SERVICES [place], [place], [address] Phone: [phone]   Fax:  [phone]  Physical Therapy Evaluation  Patient Details  Name: Sheryl Miller MRN: [id number] Date of Birth: [birth year] Referring Provider (PT): Dr. Donella Stade   Encounter Date: 05/17/2021   PT End of Session - 05/17/21 1156     Visit Number 1    Number of Visits 25    Date for PT Re-Evaluation 08/09/21    Authorization Time Period Initial Cert 7/34/1937- 9/0/2409    Progress Note Due on Visit 10    PT Start Time 1000    PT Stop Time 1100    PT Time Calculation (min) 60 min    Equipment Utilized During Treatment Gait belt    Activity Tolerance Patient tolerated treatment well;Patient limited by fatigue    Behavior During Therapy WFL for tasks assessed/performed             Past Medical History:  Diagnosis Date   Allergic rhinitis, cause unspecified    Breast cyst    Diverticulosis of colon (without mention of hemorrhage)    Esophageal reflux    Female stress incontinence    Internal hemorrhoids without mention of complication    Osteoporosis, unspecified    Other and unspecified hyperlipidemia    Raynaud's syndrome    Rheumatic fever    Unspecified constipation     Past Surgical History:  Procedure Laterality Date   ABDOMINAL HYSTERECTOMY     BLADDER REPAIR     BREAST BIOPSY Right 2000   fibrocystic disease   KNEE ARTHROSCOPY Right ~5/16   SHOULDER ARTHROSCOPY WITH ROTATOR CUFF REPAIR AND SUBACROMIAL DECOMPRESSION  08/2001   R, Scott Dean   SHOULDER SURGERY      In 2006, bilaterally shoulder surgery for rotator cuff.   TONSILLECTOMY     TUBAL LIGATION      Vitals:   05/17/21 1008  BP: (!) 118/55  Pulse: 65  Resp: 17  SpO2: 100%  Weight: 154 lb (69.9 kg)  Height: '5\' 2"'$  (1.575 m)      Subjective Assessment - 05/17/21 1011     Subjective I am having trouble with some weakness in my left hip and  trouble picking up my left foot when walking.    Pertinent History Patient is a 86 year old female with reported history of hip weakness left side and recent balance trouble with recent fall and trip later to urgent care. Patient has medical history of leg length discrepancy, Osteoporosis, rheumatic fever.    Limitations Walking;Standing;Lifting    How long can you sit comfortably? no issues    How long can you stand comfortably? I haven't measured but I think maybe 20 min if I had to    How long can you walk comfortably? about a mile maybe    Patient Stated Goals I want to walk better and not trip, stronger going up steps    Currently in Pain? No/denies               Posture Genu varus knees   Gait Slight lurch pattern, no reported pain   Strength R/L 5/3+ Hip flexion 5/3+ Hip external rotation 5/4 Hip internal rotation 5/3+ Hip extension  5/3+Hip abduction 5/4 Hip adduction 5/4 Knee extension 5/4 Knee flexion 5/5 Ankle Plantarflexion 5/5 Ankle Dorsiflexion   NEUROLOGICAL:     Sensation Grossly intact to light touch bilateral UEs/LEs as determined by testing dermatomes C2-T2/L2-S2  respectively. Proprioception and hot/cold testing deferred on this date     Coordination/Cerebellar Finger to Nose: WNL Heel to Shin: WNL Rapid alternating movements: WNL   FUNCTIONAL OUTCOME MEASURES   Results Comments  BERG 45/56 Fall risk, in need of intervention  FOTO 53       TUG 13.53 seconds   5TSTS 16.07 seconds   6 Minute Walk Test 1350 feet without AD Reported tired/left hip weak upon completion                 ASSESSMENT Clinical Impression: Pt is a pleasant 86 year-old /female referred with diagnosis of gait abnormality, balance impairment; hip weakness. PT examination reveals deficits . Pt presents with deficits in Left lower extremity strength, gait and balance and will benefit from skilled PT services to address these deficits, improve balance, and  decrease risk for future falls.     .         Pt will increase 6MWT by at least 29m(1653f in order to demonstrate clinically significant improvement in cardiopulmonary endurance and community ambulation    [facility] - 05/17/21 1016       Assessment   Medical Diagnosis Gait abnormality; balance impairment; B hip weakness    Referring Provider (PT) Dr. SpDonella Stade  Onset Date/Surgical Date 03/10/20    Hand Dominance Right    Next MD Visit in 2 weeks per patient    Prior Therapy no      Precautions   Precautions Fall      Restrictions   Weight Bearing Restrictions No    Other Position/Activity Restrictions no      Balance Screen   Has the patient fallen in the past 6 months Yes    How many times? 1    Has the patient had a decrease in activity level because of a fear of falling?  Yes    Is the patient reluctant to leave their home because of a fear of falling?  No      Home EnEcologistesidence    Living Arrangements Spouse/significant other    Available Help at Discharge Family;Friend(s)    Type of Ho[place] Mountain[place] enter    Entrance Stairs-Number of Steps 3    Entrance Stairs-Rails Cannot reach both    [place] level    [place]      Prior Function   Level of InMoapa Townetired    LeConservator, museum/galleryard with friends/trips/senior center active      Cognition   Overall Cognitive Status Within Functional Limits for tasks assessed    Attention Focused    Focused Attention Appears intact      Observation/Other Assessments   Focus on Therapeutic Outcomes (FOTO)  53      Standardized Balance Assessment   Standardized Balance Assessment Berg Balance Test      Berg Balance Test   Sit to Stand Able to stand without using hands and stabilize independently    Standing Unsupported Able to stand safely 2 minutes    Sitting with Back Unsupported but Feet  Supported on Floor or Stool Able to sit safely and securely 2 minutes    Stand to Sit Sits safely with minimal use of hands    Transfers Able to transfer safely, minor use of hands    Standing Unsupported with Eyes Closed Able to stand 10 seconds  safely    Standing Unsupported with Feet Together Able to place feet together independently and stand 1 minute safely    From Standing, Reach Forward with Outstretched Arm Can reach forward >12 cm safely (5")    From Standing Position, Pick up Object from [place] to pick up shoe, needs supervision    From Standing Position, Turn to Look Behind Over each Shoulder Looks behind from both sides and weight shifts well    Turn 360 Degrees Able to turn 360 degrees safely but slowly    Standing Unsupported, Alternately Place Feet on Step/Stool Able to complete 4 steps without aid or supervision    Standing Unsupported, One Foot in [place] to take small step independently and hold 30 seconds    Standing on One Leg Tries to lift leg/unable to hold 3 seconds but remains standing independently    Total Score 45               Access Code: [email or web address] URL: [email or web address] Date: 05/17/2021 Prepared by: Sande Brothers  Exercises Sidelying Hip Abduction - 1 x daily - 3 x weekly - 3 sets - 10 reps - 2 hold Supine Hip Abduction AROM - 1 x daily - 3 x weekly - 3 sets - 10 reps - 2 hold Seated Hip Flexion March with Ankle Weights - 1 x daily - 3 x weekly - 3 sets - 10 reps - 2 hold          Objective measurements completed on examination: See above findings.                PT Education - 05/17/21 1156     Education Details plan of care. Purpose of PT, HEP    Person(s) Educated Patient    Methods Explanation;Demonstration;Tactile cues;Verbal cues;Handout    Comprehension Verbalized understanding;Returned demonstration;Verbal cues required;Tactile cues required;Need further instruction              PT  Short Term Goals - 05/17/21 1140       PT SHORT TERM GOAL #1   Title Pt will be independent with initial HEP in order to improve strength and balance in order to decrease fall risk and improve function at home and work.    Baseline Eval= no formal HEP in place    Time 6    Period Weeks    Status New    Target Date 08/09/21               PT Long Term Goals - 05/17/21 1140       PT LONG TERM GOAL #1   Title Pt will be independent with final HEP in order to improve strength and balance in order to decrease fall risk and improve function at home and work.    Baseline Eval= no formal HEP in place    Time 12    Period Weeks    Status New    Target Date 08/09/21      PT LONG TERM GOAL #2   Title Pt will improve FOTO to target score of 58 to display perceived improvements in ability to complete ADL's    Baseline Eval= 53    Time 12    Period Weeks    Status New    Target Date 08/09/21      PT LONG TERM GOAL #3   Title Pt will improve BERG by at least 3 points in order to demonstrate clinically significant improvement in balance.  Baseline Eval= 45/56    Time 12    Period Weeks    Status New    Target Date 08/09/21      PT LONG TERM GOAL #4   Title Pt will decrease 5TSTS by at least 3 seconds in order to demonstrate clinically significant improvement in LE strength.    Baseline Eval: 16.07 sec    Time 12    Period Weeks    Status New    Target Date 08/09/21      PT LONG TERM GOAL #5   Title Patient will demonstrate improved Left hip abd strength by performing 10 reps in sidelye against gravity for improved over hip strength with standing/walking.    Baseline Eval= Patient unable to complete a full ROM hip abd left LE    Time 12    Period Weeks    Status New    Target Date 08/09/21                    Plan - 05/17/21 1157     Clinical Impression Statement Pt is a pleasant 86 year-old /female referred with diagnosis of gait abnormality, balance  impairment; hip weakness. PT examination reveals deficits . Pt presents with deficits in Left lower extremity strength, gait and balance and will benefit from skilled PT services to address these deficits, improve balance, and decrease risk for future falls.    Personal Factors and Comorbidities Age    Examination-Activity Limitations Bend;Caring for Others;Carry;Lift;Squat;Stairs;Stand    Examination-Participation Restrictions Cleaning;Community Activity;Yard Work    Biomedical scientist Low    Rehab Potential Good    PT Frequency 2x / week    PT Duration 12 weeks    PT Treatment/Interventions ADLs/Self Care Home Management;Canalith Repostioning;Cryotherapy;Electrical Stimulation;Moist Heat;Traction;DME Instruction;Gait training;Ultrasound;Stair training;Functional mobility training;Therapeutic activities;Therapeutic exercise;Balance training;Neuromuscular re-education;Patient/family education;Manual techniques;Passive range of motion;Dry needling    PT Next Visit Plan Review and progress Hip strengtheing, Implement higher level balance activities- static/dynamic    PT Home Exercise Plan 05/17/2021=Access Code: [email or web address]  URL: [email or web address]    Consulted and Agree with Plan of Care Patient             Patient will benefit from skilled therapeutic intervention in order to improve the following deficits and impairments:  Abnormal gait, Decreased activity tolerance, Decreased balance, Decreased endurance, Decreased mobility, Decreased strength, Difficulty walking, Hypomobility, Impaired flexibility  Visit Diagnosis: Abnormality of gait and mobility  Difficulty in walking, not elsewhere classified  Muscle weakness (generalized)  Unsteadiness on feet     Problem List Patient Active Problem List   Diagnosis Date Noted   Neuropathy 10/29/2018   Generalized osteoarthritis 07/10/2017   Advance  directive discussed with patient 02/13/2016   Family history of breast cancer 07/20/2015   Hyperlipemia 02/12/2015   Sleep disturbance 04/20/2014   Balance problem 01/26/2013   Routine general medical examination at a health care facility 08/16/2010   PES PLANUS 02/11/2010   UNEQUAL LEG LENGTH 02/11/2010   VARICOSE VEINS, LOWER EXTREMITIES 12/01/2007   Osteoporosis 06/24/2007   HEMORRHOIDS, INTERNAL 06/07/2007   Chronic constipation 06/07/2007   GERD 02/11/2007   Allergic rhinitis due to pollen 07/09/2006    Lewis Moccasin, PT 05/17/2021, 12:00 PM  [place] MAIN [facility] SERVICES [address] [place], [place], [address] Phone: [phone]   Fax:  [phone]  Name: NEGAR SIELER MRN: [id number] Date of Birth: 01-21-1936

## 2021-05-20 ENCOUNTER — Ambulatory Visit: Payer: Medicare HMO

## 2021-05-22 ENCOUNTER — Other Ambulatory Visit: Payer: Self-pay

## 2021-05-22 ENCOUNTER — Ambulatory Visit: Payer: Medicare HMO

## 2021-05-22 DIAGNOSIS — R269 Unspecified abnormalities of gait and mobility: Secondary | ICD-10-CM | POA: Diagnosis not present

## 2021-05-22 DIAGNOSIS — R262 Difficulty in walking, not elsewhere classified: Secondary | ICD-10-CM | POA: Diagnosis not present

## 2021-05-22 DIAGNOSIS — R2681 Unsteadiness on feet: Secondary | ICD-10-CM

## 2021-05-22 DIAGNOSIS — R29898 Other symptoms and signs involving the musculoskeletal system: Secondary | ICD-10-CM | POA: Diagnosis not present

## 2021-05-22 DIAGNOSIS — M6281 Muscle weakness (generalized): Secondary | ICD-10-CM

## 2021-05-22 DIAGNOSIS — R2689 Other abnormalities of gait and mobility: Secondary | ICD-10-CM | POA: Diagnosis not present

## 2021-05-22 NOTE — Therapy (Signed)
[facility] ?[place], [place], [address] ?Phone: [phone]   Fax:  [phone] ? ?Physical Therapy Treatment ? ?Patient Details  ?Name: Sheryl Miller ?MRN: [id number] ?Date of Birth: January 16, 1936 ?Referring Provider (PT): Dr. Donella Stade ? ? ?Encounter Date: 05/22/2021 ? ? PT End of Session - 05/22/21 0757   ? ? Visit Number 2   ? Number of Visits 25   ? Date for PT Re-Evaluation 08/09/21   ? Authorization Time Period Initial Cert 5/36/6440- 05/12/7423   ? Progress Note Due on Visit 10   ? PT Start Time [phone]   ? PT Stop Time 0844   ? PT Time Calculation (min) 45 min   ? Equipment Utilized During Treatment Gait belt   ? Activity Tolerance Patient tolerated treatment well;Patient limited by fatigue   ? Behavior During Therapy [facility] for tasks assessed/performed   ? ?  ?  ? ?  ? ? ?Past Medical History:  ?Diagnosis Date  ? Allergic rhinitis, cause unspecified   ? Breast cyst   ? Diverticulosis of colon (without mention of hemorrhage)   ? Esophageal reflux   ? Female stress incontinence   ? Internal hemorrhoids without mention of complication   ? Osteoporosis, unspecified   ? Other and unspecified hyperlipidemia   ? Raynaud's syndrome   ? Rheumatic fever   ? Unspecified constipation   ? ? ?Past Surgical History:  ?Procedure Laterality Date  ? ABDOMINAL HYSTERECTOMY    ? BLADDER REPAIR    ? BREAST BIOPSY Right 2000  ? fibrocystic disease  ? KNEE ARTHROSCOPY Right ~5/16  ? SHOULDER ARTHROSCOPY WITH ROTATOR CUFF REPAIR AND SUBACROMIAL DECOMPRESSION  08/2001  ? Ulysees Barns  ? SHOULDER SURGERY    ?  In 2006, bilaterally shoulder surgery for rotator cuff.  ? TONSILLECTOMY    ? TUBAL LIGATION    ? ? ?There were no vitals filed for this visit. ? ? Subjective Assessment - 05/22/21 0801   ? ? Subjective Had some stumbles but no falls since last session. No pain.   ? Pertinent History Patient is a 86 year old female with reported history of hip weakness  left side and recent balance trouble with recent fall and trip later to urgent care. Patient has medical history of leg length discrepancy, Osteoporosis, rheumatic fever.   ? Limitations Walking;Standing;Lifting   ? How long can you sit comfortably? no issues   ? How long can you stand comfortably? I haven't measured but I think maybe 20 min if I had to   ? How long can you walk comfortably? about a mile maybe   ? Patient Stated Goals I want to walk better and not trip, stronger going up steps   ? Currently in Pain? No/denies   ? ?  ?  ? ?  ? ? ? ? ?Neuro Re-ed:  ?Standing with CGA next to support surface:  ?Airex pad: static stand 30 seconds x 2 trials, noticeable trembling of ankles/LE's with fatigue and challenge to maintain stability ?Airex pad: horizontal head turns 30 seconds scanning room 10x ; cueing for arc of motion  ?Airex pad: vertical head turns 30 seconds, cueing for arc of motion, noticeable sway with upward gaze increasing demand on ankle righting reaction musculature ?Airex pad: one foot on 6" step one foot on airex pad, hold position for 30 seconds, switch legs, 2x each LE; ? ?Forward step over orange hurdle and back 10x each  LE ; SUE support 2 sets ?Lateral step over and back orange hurdle 10x each direction BUE support  ? ? ?TherEx:  ?10x STS ?seated step over hedgehog 15x each LE ?6" step  ?-toe taps BUE support 12x each LE  ? ?Seated:  ?RTB hamstring curl 15x each LE  ?YTB march with band around toes for additional df 10x each LE; cue for keeping feet apart ?YTB 4 way ankle righting reactions 10x each LE ? ?Pt educated throughout session about proper posture and technique with exercises. Improved exercise technique, movement at target joints, use of target muscles after min to mod verbal, visual, tactile cues. ? ?Patient has limited ankle righting reactions on unstable surfaces. Her LLE fatigues quicker than right at this time. UE support required when lifting her leg due to instability and  fatigue. Patient is highly motivated throughout session. Pt presents with deficits in Left lower extremity strength, gait and balance and will benefit from skilled PT services to address these deficits, improve balance, and decrease risk for future falls ? ? ? ? ? ? ? ? ? ? ? ? ? ? ? ? ? PT Education - 05/22/21 0756   ? ? Education Details exercise technique, body mechanics,   ? Person(s) Educated Patient   ? Methods Explanation;Demonstration;Tactile cues;Verbal cues   ? Comprehension Verbalized understanding;Returned demonstration;Tactile cues required;Verbal cues required   ? ?  ?  ? ?  ? ? ? PT Short Term Goals - 05/17/21 1140   ? ?  ? PT SHORT TERM GOAL #1  ? Title Pt will be independent with initial HEP in order to improve strength and balance in order to decrease fall risk and improve function at home and work.   ? Baseline Eval= no formal HEP in place   ? Time 6   ? Period Weeks   ? Status New   ? Target Date 08/09/21   ? ?  ?  ? ?  ? ? ? ? PT Long Term Goals - 05/17/21 1140   ? ?  ? PT LONG TERM GOAL #1  ? Title Pt will be independent with final HEP in order to improve strength and balance in order to decrease fall risk and improve function at home and work.   ? Baseline Eval= no formal HEP in place   ? Time 12   ? Period Weeks   ? Status New   ? Target Date 08/09/21   ?  ? PT LONG TERM GOAL #2  ? Title Pt will improve FOTO to target score of 58 to display perceived improvements in ability to complete ADL's   ? Baseline Eval= 53   ? Time 12   ? Period Weeks   ? Status New   ? Target Date 08/09/21   ?  ? PT LONG TERM GOAL #3  ? Title Pt will improve BERG by at least 3 points in order to demonstrate clinically significant improvement in balance.   ? Baseline Eval= 45/56   ? Time 12   ? Period Weeks   ? Status New   ? Target Date 08/09/21   ?  ? PT LONG TERM GOAL #4  ? Title Pt will decrease 5TSTS by at least 3 seconds in order to demonstrate clinically significant improvement in LE strength.   ? Baseline Eval:  16.07 sec   ? Time 12   ? Period Weeks   ? Status New   ? Target Date 08/09/21   ?  ?  PT LONG TERM GOAL #5  ? Title Patient will demonstrate improved Left hip abd strength by performing 10 reps in sidelye against gravity for improved over hip strength with standing/walking.   ? Baseline Eval= Patient unable to complete a full ROM hip abd left LE   ? Time 12   ? Period Weeks   ? Status New   ? Target Date 08/09/21   ? ?  ?  ? ?  ? ? ? ? ? ? ? ? Plan - 05/22/21 0823   ? ? Clinical Impression Statement Patient has limited ankle righting reactions on unstable surfaces. Her LLE fatigues quicker than right at this time. UE support required when lifting her leg due to instability and fatigue. Patient is highly motivated throughout session. Pt presents with deficits in Left lower extremity strength, gait and balance and will benefit from skilled PT services to address these deficits, improve balance, and decrease risk for future falls   ? Personal Factors and Comorbidities Age   ? Examination-Activity Limitations Bend;Caring for Others;Carry;Lift;Squat;Stairs;Stand   ? Examination-Participation Restrictions Cleaning;Community Activity;Valla Leaver Work   ? Stability/Clinical Decision Making Evolving/Moderate complexity   ? Rehab Potential Good   ? PT Frequency 2x / week   ? PT Duration 12 weeks   ? PT Treatment/Interventions ADLs/Self Care Home Management;Canalith Repostioning;Cryotherapy;Electrical Stimulation;Moist Heat;Traction;DME Instruction;Gait training;Ultrasound;Stair training;Functional mobility training;Therapeutic activities;Therapeutic exercise;Balance training;Neuromuscular re-education;Patient/family education;Manual techniques;Passive range of motion;Dry needling   ? PT Next Visit Plan Review and progress Hip strengtheing, Implement higher level balance activities- static/dynamic   ? PT Home Exercise Plan 05/17/2021=Access Code: [email or web address]  URL: [email or web address]   ? Consulted and Agree with Plan of  Care Patient   ? ?  ?  ? ?  ? ? ?Patient will benefit from skilled therapeutic intervention in order to improve the following deficits and impairments:  Abnormal gait, Decreased activity tolerance, Decrease

## 2021-05-24 ENCOUNTER — Ambulatory Visit: Payer: Medicare HMO | Admitting: Physical Therapy

## 2021-05-27 ENCOUNTER — Ambulatory Visit: Payer: Medicare HMO

## 2021-05-29 ENCOUNTER — Encounter: Payer: Self-pay | Admitting: Physical Therapy

## 2021-05-29 ENCOUNTER — Ambulatory Visit: Payer: Medicare HMO | Admitting: Physical Therapy

## 2021-05-29 ENCOUNTER — Other Ambulatory Visit: Payer: Self-pay

## 2021-05-29 DIAGNOSIS — R2681 Unsteadiness on feet: Secondary | ICD-10-CM

## 2021-05-29 DIAGNOSIS — R29898 Other symptoms and signs involving the musculoskeletal system: Secondary | ICD-10-CM | POA: Diagnosis not present

## 2021-05-29 DIAGNOSIS — M6281 Muscle weakness (generalized): Secondary | ICD-10-CM | POA: Diagnosis not present

## 2021-05-29 DIAGNOSIS — R269 Unspecified abnormalities of gait and mobility: Secondary | ICD-10-CM | POA: Diagnosis not present

## 2021-05-29 DIAGNOSIS — R262 Difficulty in walking, not elsewhere classified: Secondary | ICD-10-CM

## 2021-05-29 DIAGNOSIS — R2689 Other abnormalities of gait and mobility: Secondary | ICD-10-CM | POA: Diagnosis not present

## 2021-05-29 NOTE — Therapy (Signed)
[facility] ?[place], [place], [address] ?Phone: [phone]   Fax:  [phone] ? ?Physical Therapy Treatment ? ?Patient Details  ?Name: Sheryl Miller ?MRN: [id number] ?Date of Birth: 07/17/1935 ?Referring Provider (PT): Dr. Donella Stade ? ? ?Encounter Date: 05/29/2021 ? ? PT End of Session - 05/29/21 0816   ? ? Visit Number 3   ? Number of Visits 25   ? Date for PT Re-Evaluation 08/09/21   ? Authorization Time Period Initial Cert 9/74/1638- 06/12/3644   ? Progress Note Due on Visit 10   ? PT Start Time 0815   ? PT Stop Time 0900   ? PT Time Calculation (min) 45 min   ? Equipment Utilized During Treatment Gait belt   ? Activity Tolerance Patient tolerated treatment well;Patient limited by fatigue   ? Behavior During Therapy [facility] for tasks assessed/performed   ? ?  ?  ? ?  ? ? ?Past Medical History:  ?Diagnosis Date  ? Allergic rhinitis, cause unspecified   ? Breast cyst   ? Diverticulosis of colon (without mention of hemorrhage)   ? Esophageal reflux   ? Female stress incontinence   ? Internal hemorrhoids without mention of complication   ? Osteoporosis, unspecified   ? Other and unspecified hyperlipidemia   ? Raynaud's syndrome   ? Rheumatic fever   ? Unspecified constipation   ? ? ?Past Surgical History:  ?Procedure Laterality Date  ? ABDOMINAL HYSTERECTOMY    ? BLADDER REPAIR    ? BREAST BIOPSY Right 2000  ? fibrocystic disease  ? KNEE ARTHROSCOPY Right ~5/16  ? SHOULDER ARTHROSCOPY WITH ROTATOR CUFF REPAIR AND SUBACROMIAL DECOMPRESSION  08/2001  ? Ulysees Barns  ? SHOULDER SURGERY    ?  In 2006, bilaterally shoulder surgery for rotator cuff.  ? TONSILLECTOMY    ? TUBAL LIGATION    ? ? ?There were no vitals filed for this visit. ? ? Subjective Assessment - 05/29/21 0815   ? ? Subjective Patient reports doing well. No pain today. She reports some stumbles but no falls. She reports that her HEP is going well, its gotten easier.   ? Pertinent  History Patient is a 86 year old female with reported history of hip weakness left side and recent balance trouble with recent fall and trip later to urgent care. Patient has medical history of leg length discrepancy, Osteoporosis, rheumatic fever.   ? Limitations Walking;Standing;Lifting   ? How long can you sit comfortably? no issues   ? How long can you stand comfortably? I haven't measured but I think maybe 20 min if I had to   ? How long can you walk comfortably? about a mile maybe   ? Patient Stated Goals I want to walk better and not trip, stronger going up steps   ? Currently in Pain? No/denies   ? Multiple Pain Sites No   ? ?  ?  ? ?  ? ? ? ?TREATMENT: ?Warm up on Nustep BUE/BLE level 2 x4 min (unbilled); ? ?Neuro Re-ed:  ?Standing with CGA next to support surface:  ?Airex pad: static stand 30 seconds x 1 trials,with lateral sway noted ? Progressed to eyes closed 10 sec hold x1 rep with increased lateral sway requiring CGA for safety; ?Airex pad: horizontal head turns x5 reps each direction ? Progressed to arms across chest with trunk rotation x5 reps each with CGA to min A for safety with increased ankle  instability noted;  ? ?Airex pad: vertical head turns 30 seconds, cueing for arc of motion, noticeable sway with upward gaze increasing demand on ankle righting reaction musculature ?Airex pad: one foot on 6" step one foot on airex pad, hold position for 30 seconds, switch legs, 2x each LE; ?  ?Ladder drills in parallel bars: ?Forward walking x 4 laps with intermittent rail assist, cues to increase step length and increase heel strike ?Forward walking with 2# ankle weights x2 laps ? Progressed to forward walking 2# with high knee march x4 laps with intermittent rail assist ?Side stepping over ladder with 2# ankle weights x2 laps each direction with moderate difficulty reported, requiring cues to increase step length for better foot clearance;  ? ?Standing on 1/2 bolster (flat side up) ?Feet  apart: ? Heel/toe rock x15 reps with rail assist ? Progressed to unsupported standing 30 sec hold ? Progressed to head turns side/side x10 reps ? Progressed to BUE arm raise x5 reps ? Progressed to mini squat x10 reps with intermittent rail assist;  ? Patient did require CGA to min A for safety, exhibiting decreased ankle strategies during stance on compliant surface;  ?  ?TherEx:  ?Standing with red tband around BLE: ?-hip extension x10 reps each LE ?-hip abduction x10 reps each LE ?-Side stepping x10 feet x2 laps each direction;  ? ?seated step over 1/2 bolster 2# ankle weight 2x10 reps each LE; ? ?Standing on 1/2 bolster (Round side up) ?BLE ankle DF AROM x15 reps for LE ankle strengthening;  ?  ?Pt educated throughout session about proper posture and technique with exercises. Improved exercise technique, movement at target joints, use of target muscles after min to mod verbal, visual, tactile cues. ?  ?She was able to tolerate ankle weight and resistance band well with minimal discomfort;  ? ?Advanced HEP- see patient instructions;  ?  ? ? ? ? ? ? ? ? ? ? ? ? ? ? ? ? ? ? ? ? ? ? ? ? PT Education - 05/29/21 0816   ? ? Education Details exercise technique/positioning;   ? Person(s) Educated Patient   ? Methods Explanation;Verbal cues   ? Comprehension Verbalized understanding;Returned demonstration;Verbal cues required;Need further instruction   ? ?  ?  ? ?  ? ? ? PT Short Term Goals - 05/17/21 1140   ? ?  ? PT SHORT TERM GOAL #1  ? Title Pt will be independent with initial HEP in order to improve strength and balance in order to decrease fall risk and improve function at home and work.   ? Baseline Eval= no formal HEP in place   ? Time 6   ? Period Weeks   ? Status New   ? Target Date 08/09/21   ? ?  ?  ? ?  ? ? ? ? PT Long Term Goals - 05/17/21 1140   ? ?  ? PT LONG TERM GOAL #1  ? Title Pt will be independent with final HEP in order to improve strength and balance in order to decrease fall risk and improve  function at home and work.   ? Baseline Eval= no formal HEP in place   ? Time 12   ? Period Weeks   ? Status New   ? Target Date 08/09/21   ?  ? PT LONG TERM GOAL #2  ? Title Pt will improve FOTO to target score of 58 to display perceived improvements in ability to complete ADL's   ?  Baseline Eval= 53   ? Time 12   ? Period Weeks   ? Status New   ? Target Date 08/09/21   ?  ? PT LONG TERM GOAL #3  ? Title Pt will improve BERG by at least 3 points in order to demonstrate clinically significant improvement in balance.   ? Baseline Eval= 45/56   ? Time 12   ? Period Weeks   ? Status New   ? Target Date 08/09/21   ?  ? PT LONG TERM GOAL #4  ? Title Pt will decrease 5TSTS by at least 3 seconds in order to demonstrate clinically significant improvement in LE strength.   ? Baseline Eval: 16.07 sec   ? Time 12   ? Period Weeks   ? Status New   ? Target Date 08/09/21   ?  ? PT LONG TERM GOAL #5  ? Title Patient will demonstrate improved Left hip abd strength by performing 10 reps in sidelye against gravity for improved over hip strength with standing/walking.   ? Baseline Eval= Patient unable to complete a full ROM hip abd left LE   ? Time 12   ? Period Weeks   ? Status New   ? Target Date 08/09/21   ? ?  ?  ? ?  ? ? ? ? ? ? ? ? Plan - 05/29/21 1059   ? ? Clinical Impression Statement Patient motivated and participated well within session. She was instructed in advanced LE strengthening, advancing HEP. She was able to tolerate resistance band well. She did require min VCS for proper exercise technique. She does exhibit decreased hip and ankle stability which does affect balance/stance control. Patient does exhibit decreased ankle strategies while on compliant surface. She required CGA for safety with added exercise difficulty, especially with reduced rail assist. She would benefit from additional skilled PT intervention to improve strength, balance and gait safety;   ? Personal Factors and Comorbidities Age   ?  Examination-Activity Limitations Bend;Caring for Others;Carry;Lift;Squat;Stairs;Stand   ? Examination-Participation Restrictions Cleaning;Community Activity;Valla Leaver Work   ? Stability/Clinical Decision Making Evolving/Moderate complex

## 2021-05-29 NOTE — Patient Instructions (Signed)
Access Code: [email or web address] ?URL: [email or web address] ?Date: 05/29/2021 ?Prepared by: Blanche East ? ?Exercises ?Sidelying Hip Abduction - 1 x daily - 3 x weekly - 3 sets - 10 reps - 2 hold ?Seated Hip Flexion March with Ankle Weights - 1 x daily - 3 x weekly - 3 sets - 10 reps - 2 hold ?Standing Hip Extension with Resistance at Ankles and Counter Support - 1 x daily - 3 x weekly - 2 sets - 10 reps ?Standing Hip Abduction with Resistance at Ankles and Counter Support - 1 x daily - 3 x weekly - 2 sets - 10 reps ?Side Stepping with Resistance at Ankles and Counter Support - 1 x daily - 3 x weekly - 2 sets - 2-5 reps ?Standing Heel Raise with Support - 1 x daily - 7 x weekly - 2 sets - 10 reps ? ?

## 2021-05-31 ENCOUNTER — Ambulatory Visit: Payer: Medicare HMO

## 2021-06-03 ENCOUNTER — Ambulatory Visit: Payer: Medicare HMO

## 2021-06-05 ENCOUNTER — Other Ambulatory Visit: Payer: Self-pay

## 2021-06-05 ENCOUNTER — Ambulatory Visit: Payer: Medicare HMO

## 2021-06-05 DIAGNOSIS — R2681 Unsteadiness on feet: Secondary | ICD-10-CM

## 2021-06-05 DIAGNOSIS — R262 Difficulty in walking, not elsewhere classified: Secondary | ICD-10-CM | POA: Diagnosis not present

## 2021-06-05 DIAGNOSIS — M6281 Muscle weakness (generalized): Secondary | ICD-10-CM

## 2021-06-05 DIAGNOSIS — R269 Unspecified abnormalities of gait and mobility: Secondary | ICD-10-CM

## 2021-06-05 DIAGNOSIS — R2689 Other abnormalities of gait and mobility: Secondary | ICD-10-CM | POA: Diagnosis not present

## 2021-06-05 DIAGNOSIS — R29898 Other symptoms and signs involving the musculoskeletal system: Secondary | ICD-10-CM | POA: Diagnosis not present

## 2021-06-05 NOTE — Therapy (Signed)
[place] [facility] ?[place], [place], [address] ?Phone: [phone]   Fax:  [phone] ? ?Physical Therapy Treatment ? ?Patient Details  ?Name: Sheryl Miller ?MRN: [id number] ?Date of Birth: 1935-04-05 ?Referring Provider (PT): Dr. Donella Stade ? ? ?Encounter Date: 06/05/2021 ? ? PT End of Session - 06/05/21 1015   ? ? Visit Number 4   ? Number of Visits 25   ? Date for PT Re-Evaluation 08/09/21   ? Authorization Time Period Initial Cert 9/70/2637- 10/12/8848   ? Progress Note Due on Visit 10   ? PT Start Time 1101   ? PT Stop Time 1144   ? PT Time Calculation (min) 43 min   ? Equipment Utilized During Treatment Gait belt   ? Activity Tolerance Patient tolerated treatment well;Patient limited by fatigue   ? Behavior During Therapy [facility] for tasks assessed/performed   ? ?  ?  ? ?  ? ? ?Past Medical History:  ?Diagnosis Date  ? Allergic rhinitis, cause unspecified   ? Breast cyst   ? Diverticulosis of colon (without mention of hemorrhage)   ? Esophageal reflux   ? Female stress incontinence   ? Internal hemorrhoids without mention of complication   ? Osteoporosis, unspecified   ? Other and unspecified hyperlipidemia   ? Raynaud's syndrome   ? Rheumatic fever   ? Unspecified constipation   ? ? ?Past Surgical History:  ?Procedure Laterality Date  ? ABDOMINAL HYSTERECTOMY    ? BLADDER REPAIR    ? BREAST BIOPSY Right 2000  ? fibrocystic disease  ? KNEE ARTHROSCOPY Right ~5/16  ? SHOULDER ARTHROSCOPY WITH ROTATOR CUFF REPAIR AND SUBACROMIAL DECOMPRESSION  08/2001  ? Ulysees Barns  ? SHOULDER SURGERY    ?  In 2006, bilaterally shoulder surgery for rotator cuff.  ? TONSILLECTOMY    ? TUBAL LIGATION    ? ? ?There were no vitals filed for this visit. ? ? Subjective Assessment - 06/05/21 1108   ? ? Subjective Patient reports she was alittle sore but overall doing well.   ? Pertinent History Patient is a 86 year old female with reported history of hip weakness  left side and recent balance trouble with recent fall and trip later to urgent care. Patient has medical history of leg length discrepancy, Osteoporosis, rheumatic fever.   ? Limitations Walking;Standing;Lifting   ? How long can you sit comfortably? no issues   ? How long can you stand comfortably? I haven't measured but I think maybe 20 min if I had to   ? How long can you walk comfortably? about a mile maybe   ? Patient Stated Goals I want to walk better and not trip, stronger going up steps   ? Currently in Pain? No/denies   ? ?  ?  ? ?  ? ? ? ? ? ? ? ? ? ? ? ? ? ? ? ? ? ? ?INTERVENTIONS:  ? ?Neuromuscular re-ed: ? ? ?Dynamic Marching (High knees) on airex pad- VC and intermittent use of support bar x 15 reps each LE- VC to slow down to focus on SLS/balance ? ?Side step up/over 1/2 foam- Hands hovering over support bar yet patient able to accomplish 12 reps without UE Support ? ?Forward/backward step over 1/2 foam- x 12 reps ? ?Single leg [place] Dead lift (reaching down to pick up cone on 6" block) x 10 reps each. ? ? ?Therapeutic Exercises: ? ?Instructed in Hip  strengthening:  ? ?Sidelye clamshell- using GTB - each Side  ?Sidelye hip abd ?10 reps each leg  ? ?Education provided throughout session via VC/TC and demonstration to facilitate movement at target joints and correct muscle activation for all testing and exercises performed.  ? ? ? ? ?Access Code: [email or web address] ?URL: [email or web address] ?Date: 06/05/2021 ?Prepared by: Sande Brothers ? ?Exercises ?- Clamshell  - 1 x daily - 3 x weekly - 3 sets - 10 reps ?- Sidelying Hip Abduction  - 1 x daily - 3 x weekly - 3 sets - 10 reps ? ? ? ? ? ? ? PT Short Term Goals - 05/17/21 1140   ? ?  ? PT SHORT TERM GOAL #1  ? Title Pt will be independent with initial HEP in order to improve strength and balance in order to decrease fall risk and improve function at home and work.   ? Baseline Eval= no formal HEP in place   ? Time 6   ? Period Weeks   ?  Status New   ? Target Date 08/09/21   ? ?  ?  ? ?  ? ? ? ? PT Long Term Goals - 05/17/21 1140   ? ?  ? PT LONG TERM GOAL #1  ? Title Pt will be independent with final HEP in order to improve strength and balance in order to decrease fall risk and improve function at home and work.   ? Baseline Eval= no formal HEP in place   ? Time 12   ? Period Weeks   ? Status New   ? Target Date 08/09/21   ?  ? PT LONG TERM GOAL #2  ? Title Pt will improve FOTO to target score of 58 to display perceived improvements in ability to complete ADL's   ? Baseline Eval= 53   ? Time 12   ? Period Weeks   ? Status New   ? Target Date 08/09/21   ?  ? PT LONG TERM GOAL #3  ? Title Pt will improve BERG by at least 3 points in order to demonstrate clinically significant improvement in balance.   ? Baseline Eval= 45/56   ? Time 12   ? Period Weeks   ? Status New   ? Target Date 08/09/21   ?  ? PT LONG TERM GOAL #4  ? Title Pt will decrease 5TSTS by at least 3 seconds in order to demonstrate clinically significant improvement in LE strength.   ? Baseline Eval: 16.07 sec   ? Time 12   ? Period Weeks   ? Status New   ? Target Date 08/09/21   ?  ? PT LONG TERM GOAL #5  ? Title Patient will demonstrate improved Left hip abd strength by performing 10 reps in sidelye against gravity for improved over hip strength with standing/walking.   ? Baseline Eval= Patient unable to complete a full ROM hip abd left LE   ? Time 12   ? Period Weeks   ? Status New   ? Target Date 08/09/21   ? ?  ?  ? ?  ? ? ? ? ? ? ? ? Plan - 06/05/21 1014   ? ? Clinical Impression Statement Patient presented with good motivation today and performed well with instructed balance activities and later hip strengthening. She was able to verbalize understanding of new HEP and return demonstration. She required occasional reaching for support bar yet able to progress with practice with less  hand support. She would benefit from additional skilled PT intervention to improve strength,  balance and gait safety.   ? Personal Factors and Comorbidities Age   ? Examination-Activity Limitations Bend;Caring for Others;Carry;Lift;Squat;Stairs;Stand   ? Examination-Participation Restrictions Cleaning;Community Activity;Valla Leaver Work   ? Stability/Clinical Decision Making Evolving/Moderate complexity   ? Rehab Potential Good   ? PT Frequency 2x / week   ? PT Duration 12 weeks   ? PT Treatment/Interventions ADLs/Self Care Home Management;Canalith Repostioning;Cryotherapy;Electrical Stimulation;Moist Heat;Traction;DME Instruction;Gait training;Ultrasound;Stair training;Functional mobility training;Therapeutic activities;Therapeutic exercise;Balance training;Neuromuscular re-education;Patient/family education;Manual techniques;Passive range of motion;Dry needling   ? PT Next Visit Plan Review and progress Hip strengtheing, Implement higher level balance activities- static/dynamic   ? PT Home Exercise Plan 05/17/2021=Access Code: [email or web address]  URL: [email or web address]; 06/05/2021=Access Code: [email or web address]  URL: [email or web address]   ? Consulted and Agree with Plan of Care Patient   ? ?  ?  ? ?  ? ? ?Patient will benefit from skilled therapeutic intervention in order to improve the following deficits and impairments:  Abnormal gait, Decreased activity tolerance, Decreased balance, Decreased endurance, Decreased mobility, Decreased strength, Difficulty walking, Hypomobility, Impaired flexibility ? ?Visit Diagnosis: ?Abnormality of gait and mobility ? ?Difficulty in walking, not elsewhere classified ? ?Muscle weakness (generalized) ? ?Unsteadiness on feet ? ? ? ? ?Problem List ?Patient Active Problem List  ? Diagnosis Date Noted  ? Neuropathy 10/29/2018  ? Generalized osteoarthritis 07/10/2017  ? Advance directive discussed with patient 02/13/2016  ? Family history of breast cancer 07/20/2015  ? Hyperlipemia 02/12/2015  ? Sleep disturbance 04/20/2014  ? Balance problem 01/26/2013  ? Routine  general medical examination at a health care facility 08/16/2010  ? PES PLANUS 02/11/2010  ? UNEQUAL LEG LENGTH 02/11/2010  ? VARICOSE VEINS, LOWER EXTREMITIES 12/01/2007  ? Osteoporosis 06/24/2007  ? HEMORRHOIDS, INT

## 2021-06-11 ENCOUNTER — Ambulatory Visit: Payer: Medicare HMO | Attending: Family Medicine

## 2021-06-11 DIAGNOSIS — R269 Unspecified abnormalities of gait and mobility: Secondary | ICD-10-CM | POA: Insufficient documentation

## 2021-06-11 DIAGNOSIS — R262 Difficulty in walking, not elsewhere classified: Secondary | ICD-10-CM | POA: Diagnosis not present

## 2021-06-11 DIAGNOSIS — M6281 Muscle weakness (generalized): Secondary | ICD-10-CM

## 2021-06-11 DIAGNOSIS — R2681 Unsteadiness on feet: Secondary | ICD-10-CM

## 2021-06-11 NOTE — Therapy (Signed)
[place] [facility] ?[place], [place], [address] ?Phone: [phone]   Fax:  [phone] ? ?Physical Therapy Treatment ? ?Patient Details  ?Name: Sheryl Miller ?MRN: [id number] ?Date of Birth: [birth year] ?Referring Provider (PT): Dr. Donella Stade ? ? ?Encounter Date: 06/11/2021 ? ? PT End of Session - 06/11/21 1524   ? ? Visit Number 5   ? Number of Visits 25   ? Date for PT Re-Evaluation 08/09/21   ? Authorization Time Period Initial Cert 9/93/7169- 08/14/8936   ? Progress Note Due on Visit 10   ? PT Start Time 1007   ? PT Stop Time 1043   ? PT Time Calculation (min) 36 min   ? Equipment Utilized During Treatment Gait belt   ? Activity Tolerance Patient tolerated treatment well   ? Behavior During Therapy [facility] for tasks assessed/performed   ? ?  ?  ? ?  ? ? ?Past Medical History:  ?Diagnosis Date  ? Allergic rhinitis, cause unspecified   ? Breast cyst   ? Diverticulosis of colon (without mention of hemorrhage)   ? Esophageal reflux   ? Female stress incontinence   ? Internal hemorrhoids without mention of complication   ? Osteoporosis, unspecified   ? Other and unspecified hyperlipidemia   ? Raynaud's syndrome   ? Rheumatic fever   ? Unspecified constipation   ? ? ?Past Surgical History:  ?Procedure Laterality Date  ? ABDOMINAL HYSTERECTOMY    ? BLADDER REPAIR    ? BREAST BIOPSY Right 2000  ? fibrocystic disease  ? KNEE ARTHROSCOPY Right ~5/16  ? SHOULDER ARTHROSCOPY WITH ROTATOR CUFF REPAIR AND SUBACROMIAL DECOMPRESSION  08/2001  ? Ulysees Barns  ? SHOULDER SURGERY    ?  In 2006, bilaterally shoulder surgery for rotator cuff.  ? TONSILLECTOMY    ? TUBAL LIGATION    ? ? ?There were no vitals filed for this visit. ? ? ? Subjective Assessment - 06/11/21 1005   ? ? Subjective Pt reports no pain currently. Pt reports no falls but one near fall where she lost her balance to the side. She caught herself on the wall.   ? Pertinent History Patient is a 86  year old female with reported history of hip weakness left side and recent balance trouble with recent fall and trip later to urgent care. Patient has medical history of leg length discrepancy, Osteoporosis, rheumatic fever.   ? Limitations Walking;Standing;Lifting   ? How long can you sit comfortably? no issues   ? How long can you stand comfortably? I haven't measured but I think maybe 20 min if I had to   ? How long can you walk comfortably? about a mile maybe   ? Patient Stated Goals I want to walk better and not trip, stronger going up steps   ? Currently in Pain? No/denies   ? ?  ?  ? ?  ? ? ?INTERVENTIONS ? ?NMR: ?  ?Dynamic Marching (High knees) on airex pad- VC 2x 20 reps each LE- Intermittent UE support on bar ? ?Single leg [place] Dead lift (reaching down to pick up cone on 6" block) x 10 reps each LE, no UE support, mild unsteadiness. Pt rates as hard ?  ?Side step over 1/2 foam without UE support- x multiple reps  ?  ?Forward/backward step over 1/2 foam- x 20 each way  ? ?Ambulation down hallway with reading sticky notes to incorporate vertical, horizontal head turns,  CGA 4x. Increased variability in BOS, pt reports feeling difficulty with balance/unsteadiness ? ?Ambulating with vertical head turns 2x down hallway - some unsteadiness, not as difficulty as previous task ? ?Ambulating semi-tandem  2x length of hallway, challenging. ? ?SLS 2x 45 sec each LE ?  ?Therapeutic Exercises: ?  ?Instructed in Hip strengthening:  ?4# weights donned each LE: ?Standing march 2x20 each LE, BUE support. Pt rates easy  ?Standing hip abduction 2x10 each LE, BUE support. Pt rates fatiguing.  ? ?Education provided throughout session via VC/TC and demonstration to facilitate movement at target joints and correct muscle activation for all testing and exercises performed.  ?  ? PT Education - 06/11/21 1524   ? ? Education Details exercise technique, body mechanics   ? Person(s) Educated Patient   ? Methods  Explanation;Demonstration;Verbal cues   ? Comprehension Verbalized understanding;Verbal cues required;Need further instruction;Returned demonstration   ? ?  ?  ? ?  ? ? ? PT Short Term Goals - 05/17/21 1140   ? ?  ? PT SHORT TERM GOAL #1  ? Title Pt will be independent with initial HEP in order to improve strength and balance in order to decrease fall risk and improve function at home and work.   ? Baseline Eval= no formal HEP in place   ? Time 6   ? Period Weeks   ? Status New   ? Target Date 08/09/21   ? ?  ?  ? ?  ? ? ? ? PT Long Term Goals - 05/17/21 1140   ? ?  ? PT LONG TERM GOAL #1  ? Title Pt will be independent with final HEP in order to improve strength and balance in order to decrease fall risk and improve function at home and work.   ? Baseline Eval= no formal HEP in place   ? Time 12   ? Period Weeks   ? Status New   ? Target Date 08/09/21   ?  ? PT LONG TERM GOAL #2  ? Title Pt will improve FOTO to target score of 58 to display perceived improvements in ability to complete ADL's   ? Baseline Eval= 53   ? Time 12   ? Period Weeks   ? Status New   ? Target Date 08/09/21   ?  ? PT LONG TERM GOAL #3  ? Title Pt will improve BERG by at least 3 points in order to demonstrate clinically significant improvement in balance.   ? Baseline Eval= 45/56   ? Time 12   ? Period Weeks   ? Status New   ? Target Date 08/09/21   ?  ? PT LONG TERM GOAL #4  ? Title Pt will decrease 5TSTS by at least 3 seconds in order to demonstrate clinically significant improvement in LE strength.   ? Baseline Eval: 16.07 sec   ? Time 12   ? Period Weeks   ? Status New   ? Target Date 08/09/21   ?  ? PT LONG TERM GOAL #5  ? Title Patient will demonstrate improved Left hip abd strength by performing 10 reps in sidelye against gravity for improved over hip strength with standing/walking.   ? Baseline Eval= Patient unable to complete a full ROM hip abd left LE   ? Time 12   ? Period Weeks   ? Status New   ? Target Date 08/09/21   ? ?  ?  ? ?   ? ? ? ? ? ? ? ?  Plan - 06/11/21 1534   ? ? Clinical Impression Statement Pt with good motivation to participate in session. Pt able to perform standing strengthening interventions, rating standing march as easy, but standing hip abduction as somewhat fatiguing. Pt only with some L hip discomfort felt with standing balance exercise, single leg romanian dead lift (picking up cone from 6" block). Pt balance most challenged with ambulating down hallway with use of head turns to read sticky notes. The pt will benefit from further skilled PT to improve strength, balance and safety with gait.   ? Personal Factors and Comorbidities Age   ? Examination-Activity Limitations Bend;Caring for Others;Carry;Lift;Squat;Stairs;Stand   ? Examination-Participation Restrictions Cleaning;Community Activity;Valla Leaver Work   ? Stability/Clinical Decision Making Evolving/Moderate complexity   ? Rehab Potential Good   ? PT Frequency 2x / week   ? PT Duration 12 weeks   ? PT Treatment/Interventions ADLs/Self Care Home Management;Canalith Repostioning;Cryotherapy;Electrical Stimulation;Moist Heat;Traction;DME Instruction;Gait training;Ultrasound;Stair training;Functional mobility training;Therapeutic activities;Therapeutic exercise;Balance training;Neuromuscular re-education;Patient/family education;Manual techniques;Passive range of motion;Dry needling   ? PT Next Visit Plan Review and progress Hip strengtheing, Implement higher level balance activities- static/dynamic   ? PT Home Exercise Plan 05/17/2021=Access Code: [email or web address]  URL: [email or web address]; 06/05/2021=Access Code: [email or web address]  URL: [email or web address]; no updates   ? Consulted and Agree with Plan of Care Patient   ? ?  ?  ? ?  ? ? ?Patient will benefit from skilled therapeutic intervention in order to improve the following deficits and impairments:  Abnormal gait, Decreased activity tolerance, Decreased balance, Decreased endurance, Decreased mobility,  Decreased strength, Difficulty walking, Hypomobility, Impaired flexibility ? ?Visit Diagnosis: ?Unsteadiness on feet ? ?Muscle weakness (generalized) ? ? ? ? ?Problem List ?Patient Active Problem List  ? Diagnosis Date Note

## 2021-06-13 ENCOUNTER — Ambulatory Visit: Payer: Medicare HMO | Admitting: Physical Therapy

## 2021-06-13 DIAGNOSIS — R269 Unspecified abnormalities of gait and mobility: Secondary | ICD-10-CM

## 2021-06-13 DIAGNOSIS — R2681 Unsteadiness on feet: Secondary | ICD-10-CM

## 2021-06-13 DIAGNOSIS — R262 Difficulty in walking, not elsewhere classified: Secondary | ICD-10-CM | POA: Diagnosis not present

## 2021-06-13 DIAGNOSIS — M6281 Muscle weakness (generalized): Secondary | ICD-10-CM

## 2021-06-13 NOTE — Therapy (Signed)
[place] ?[place], [place], [address] ?Phone: [phone]   Fax:  [phone] ? ?Physical Therapy Treatment ? ?Patient Details  ?Name: Sheryl Miller ?MRN: [id number] ?Date of Birth: [birth year] ?Referring Provider (PT): Dr. Donella Stade ? ? ?Encounter Date: 06/13/2021 ? ? PT End of Session - 06/13/21 1022   ? ? Visit Number 6   ? Number of Visits 25   ? Date for PT Re-Evaluation 08/09/21   ? Authorization Time Period Initial Cert 2/45/8099- 10/11/3823   ? Progress Note Due on Visit 10   ? PT Start Time 1017   ? PT Stop Time 1100   ? PT Time Calculation (min) 43 min   ? Equipment Utilized During Treatment Gait belt   ? Activity Tolerance Patient tolerated treatment well   ? Behavior During Therapy [facility] for tasks assessed/performed   ? ?  ?  ? ?  ? ? ?Past Medical History:  ?Diagnosis Date  ? Allergic rhinitis, cause unspecified   ? Breast cyst   ? Diverticulosis of colon (without mention of hemorrhage)   ? Esophageal reflux   ? Female stress incontinence   ? Internal hemorrhoids without mention of complication   ? Osteoporosis, unspecified   ? Other and unspecified hyperlipidemia   ? Raynaud's syndrome   ? Rheumatic fever   ? Unspecified constipation   ? ? ?Past Surgical History:  ?Procedure Laterality Date  ? ABDOMINAL HYSTERECTOMY    ? BLADDER REPAIR    ? BREAST BIOPSY Right 2000  ? fibrocystic disease  ? KNEE ARTHROSCOPY Right ~5/16  ? SHOULDER ARTHROSCOPY WITH ROTATOR CUFF REPAIR AND SUBACROMIAL DECOMPRESSION  08/2001  ? Ulysees Barns  ? SHOULDER SURGERY    ?  In 2006, bilaterally shoulder surgery for rotator cuff.  ? TONSILLECTOMY    ? TUBAL LIGATION    ? ? ?There were no vitals filed for this visit. ? ? Subjective Assessment - 06/13/21 1019   ? ? Subjective Pt reports no pain and no falls since last visit either.  Pt notes no easter plans other than maybe grilling out (hoping to have lamb chops).   ? Pertinent History Patient is a 86  year old female with reported history of hip weakness left side and recent balance trouble with recent fall and trip later to urgent care. Patient has medical history of leg length discrepancy, Osteoporosis, rheumatic fever.   ? Limitations Walking;Standing;Lifting   ? How long can you sit comfortably? no issues   ? How long can you stand comfortably? I haven't measured but I think maybe 20 min if I had to   ? How long can you walk comfortably? about a mile maybe   ? Patient Stated Goals I want to walk better and not trip, stronger going up steps   ? ?  ?  ? ?  ? ? ? ? ? ?INTERVENTIONS ?  ?NMR: ?  ?Dynamic Marching (High knees) on airex pad- VC 30 sec bouts x2 reps each LE- Intermittent UE support on bar ?Dead lift standing on airex pad, picking up cone from floor, 2x10, pt noting some increased pain in the back from performing, instructed on performing with more knee flexion ?Single leg [place] Dead lift (reaching down to pick up cone on from floor) x 10 reps each LE, no UE support, increased unsteadiness. Pt rates as hard ?Side step over 1/2 foam without UE support- x 10 each way  ?Forward/backward  step over 1/2 foam- x 10 each way  ?Ambulating with vertical head turns 4x down hallway - minimal unsteadiness ?Ambulation down hallway with reading sticky notes to incorporate vertical, horizontal head turns, CGA 4x. Pt with much better carryover this session compared to prior session. ?  ?Therapeutic Exercises: ?  ?Instructed in Hip strengthening: ?Seated hamstring stretch, 30 sec holds ea LE ?4# weights donned each LE: ?Standing march 2x20 each LE, BUE support. Pt rates easy  ?Standing hip abduction 2x10 each LE, BUE support. Pt rates fatiguing.  ? ? ? ? ? ? ? ? ? ? ? ? PT Short Term Goals - 05/17/21 1140   ? ?  ? PT SHORT TERM GOAL #1  ? Title Pt will be independent with initial HEP in order to improve strength and balance in order to decrease fall risk and improve function at home and work.   ? Baseline Eval= no  formal HEP in place   ? Time 6   ? Period Weeks   ? Status New   ? Target Date 08/09/21   ? ?  ?  ? ?  ? ? ? ? PT Long Term Goals - 05/17/21 1140   ? ?  ? PT LONG TERM GOAL #1  ? Title Pt will be independent with final HEP in order to improve strength and balance in order to decrease fall risk and improve function at home and work.   ? Baseline Eval= no formal HEP in place   ? Time 12   ? Period Weeks   ? Status New   ? Target Date 08/09/21   ?  ? PT LONG TERM GOAL #2  ? Title Pt will improve FOTO to target score of 58 to display perceived improvements in ability to complete ADL's   ? Baseline Eval= 53   ? Time 12   ? Period Weeks   ? Status New   ? Target Date 08/09/21   ?  ? PT LONG TERM GOAL #3  ? Title Pt will improve BERG by at least 3 points in order to demonstrate clinically significant improvement in balance.   ? Baseline Eval= 45/56   ? Time 12   ? Period Weeks   ? Status New   ? Target Date 08/09/21   ?  ? PT LONG TERM GOAL #4  ? Title Pt will decrease 5TSTS by at least 3 seconds in order to demonstrate clinically significant improvement in LE strength.   ? Baseline Eval: 16.07 sec   ? Time 12   ? Period Weeks   ? Status New   ? Target Date 08/09/21   ?  ? PT LONG TERM GOAL #5  ? Title Patient will demonstrate improved Left hip abd strength by performing 10 reps in sidelye against gravity for improved over hip strength with standing/walking.   ? Baseline Eval= Patient unable to complete a full ROM hip abd left LE   ? Time 12   ? Period Weeks   ? Status New   ? Target Date 08/09/21   ? ?  ?  ? ?  ? ? ? ? ? ? ? ? Plan - 06/13/21 1022   ? ? Clinical Impression Statement Pt with good motivation during treatment session and performed well with given tasks.  Pt with much improved balance when performing hallway walks with post-it recall, noting it to be easier today with less unsteadiness.  Pt also progressed Ben[place] dead lift to performing the  action from the floor rather than a 6" block.  Will continue to  progress as necessary as pt will continue to benefit from skilled therapy to improve strength, balance, and gait mechanics.   ? Personal Factors and Comorbidities Age   ? Examination-Activity Limitations Bend;Caring for Others;Carry;Lift;Squat;Stairs;Stand   ? Examination-Participation Restrictions Cleaning;Community Activity;Valla Leaver Work   ? Stability/Clinical Decision Making Evolving/Moderate complexity   ? Rehab Potential Good   ? PT Frequency 2x / week   ? PT Duration 12 weeks   ? PT Treatment/Interventions ADLs/Self Care Home Management;Canalith Repostioning;Cryotherapy;Electrical Stimulation;Moist Heat;Traction;DME Instruction;Gait training;Ultrasound;Stair training;Functional mobility training;Therapeutic activities;Therapeutic exercise;Balance training;Neuromuscular re-education;Patient/family education;Manual techniques;Passive range of motion;Dry needling   ? PT Next Visit Plan Review and progress Hip strengtheing, Implement higher level balance activities- static/dynamic   ? PT Home Exercise Plan 05/17/2021=Access Code: [email or web address]  URL: [email or web address]; 06/05/2021=Access Code: [email or web address]  URL: [email or web address]; no updates   ? Consulted and Agree with Plan of Care Patient   ? ?  ?  ? ?  ? ? ?Patient will benefit from skilled therapeutic intervention in order to improve the following deficits and impairments:  Abnormal gait, Decreased activity tolerance, Decreased balance, Decreased endurance, Decreased mobility, Decreased strength, Difficulty walking, Hypomobility, Impaired flexibility ? ?Visit Diagnosis: ?Difficulty in walking, not elsewhere classified ? ?Muscle weakness (generalized) ? ?Abnormality of gait and mobility ? ?Unsteadiness on feet ? ? ? ? ?Problem List ?Patient Active Problem List  ? Diagnosis Date Noted  ? Neuropathy 10/29/2018  ? Generalized osteoarthritis 07/10/2017  ? Advance directive discussed with patient 02/13/2016  ? Family history of breast cancer  07/20/2015  ? Hyperlipemia 02/12/2015  ? Sleep disturbance 04/20/2014  ? Balance problem 01/26/2013  ? Routine general medical examination at a health care facility 08/16/2010  ? PES PLANUS 02/11/2010  ? UNEQUAL

## 2021-06-18 ENCOUNTER — Ambulatory Visit: Payer: Medicare HMO

## 2021-06-18 DIAGNOSIS — R262 Difficulty in walking, not elsewhere classified: Secondary | ICD-10-CM

## 2021-06-18 DIAGNOSIS — R269 Unspecified abnormalities of gait and mobility: Secondary | ICD-10-CM | POA: Diagnosis not present

## 2021-06-18 DIAGNOSIS — M6281 Muscle weakness (generalized): Secondary | ICD-10-CM | POA: Diagnosis not present

## 2021-06-18 DIAGNOSIS — R2681 Unsteadiness on feet: Secondary | ICD-10-CM | POA: Diagnosis not present

## 2021-06-18 NOTE — Therapy (Signed)
[place] ?[facility] ?[place], [place], [address] ?Phone: [phone]   Fax:  [phone] ? ?Physical Therapy Treatment ? ?Patient Details  ?Name: Sheryl Miller ?MRN: [id number] ?Date of Birth: 1936/01/30 ?Referring Provider (PT): Dr. Donella Stade ? ? ?Encounter Date: 06/18/2021 ? ? PT End of Session - 06/18/21 1130   ? ? Visit Number 7   ? Number of Visits 25   ? Date for PT Re-Evaluation 08/09/21   ? Authorization Time Period Initial Cert 7/86/7672- 0/11/4707   ? Progress Note Due on Visit 10   ? PT Start Time 1027   ? PT Stop Time 1113   ? PT Time Calculation (min) 46 min   ? Equipment Utilized During Treatment Gait belt   ? Activity Tolerance Patient tolerated treatment well;No increased pain   ? Behavior During Therapy [facility] for tasks assessed/performed   ? ?  ?  ? ?  ? ? ?Past Medical History:  ?Diagnosis Date  ? Allergic rhinitis, cause unspecified   ? Breast cyst   ? Diverticulosis of colon (without mention of hemorrhage)   ? Esophageal reflux   ? Female stress incontinence   ? Internal hemorrhoids without mention of complication   ? Osteoporosis, unspecified   ? Other and unspecified hyperlipidemia   ? Raynaud's syndrome   ? Rheumatic fever   ? Unspecified constipation   ? ? ?Past Surgical History:  ?Procedure Laterality Date  ? ABDOMINAL HYSTERECTOMY    ? BLADDER REPAIR    ? BREAST BIOPSY Right 2000  ? fibrocystic disease  ? KNEE ARTHROSCOPY Right ~5/16  ? SHOULDER ARTHROSCOPY WITH ROTATOR CUFF REPAIR AND SUBACROMIAL DECOMPRESSION  08/2001  ? Ulysees Barns  ? SHOULDER SURGERY    ?  In 2006, bilaterally shoulder surgery for rotator cuff.  ? TONSILLECTOMY    ? TUBAL LIGATION    ? ? ?There were no vitals filed for this visit. ? ? Subjective Assessment - 06/18/21 1027   ? ? Subjective Pt rates L knee pain 3-4/10 currently. Pt reports no falls/stumbles.   ? Pertinent History Patient is a 86 year old female with reported history of hip weakness  left side and recent balance trouble with recent fall and trip later to urgent care. Patient has medical history of leg length discrepancy, Osteoporosis, rheumatic fever.   ? Limitations Walking;Standing;Lifting   ? How long can you sit comfortably? no issues   ? How long can you stand comfortably? I haven't measured but I think maybe 20 min if I had to   ? How long can you walk comfortably? about a mile maybe   ? Patient Stated Goals I want to walk better and not trip, stronger going up steps   ? Currently in Pain? Yes   ? Pain Score 4    ? Pain Location Knee   ? Pain Orientation Left   ? ?  ?  ? ?  ? ? ? ?INTERVENTIONS ?  ?NMR: Gait belt donned and CGA provided throughout unless otherwise specified  ?  ?Dynamic Marching (High knees) on airex pad- Intermittent UE support on bar 2x20 each LE ?--progressed to toe taps on balance pods 2x20 each LE.Rates challenging ? ?At support surface- ?On airex with BLE in NBOS- ?EO 30 sec. No decrease in postural stability ?EC 2x60 sec. Increased sway, ankle righting but no LOB.  ?NBOS with horizontal, vertical and diagonal head turns B 10 reps for each in each  direction. Pt rates medium, appears most challenged with horizontal head turns.  ? ?Single leg deadlift of cone- 5x each LE. Intermittent UE support. ? ?Performed over 10 meter track in clinic: ?Semi tandem walking 4x. Improves with reps ?Tandem walking 4x. Improves with reps, but increased use of step-strategy ?Ambulation with horizontal head turns - significant increase in variability of  BOS ?Ambulation with =vertical head turns - significant increase in variability in BOS  ? ?At support surface ?Airex beam: ?Modified tandem walking 6x length of airex beam ?Cross-over stepping 4x length of airex beam ?Static semi-tandem stance 30 sec each LE ?Rates interventions as moderate ? ?Therapeutic Exercises: ?4# weights donned each LE: ?Standing march 2x22 each LE, BUE support. Pt reports feeling limited due to stiffness  sensation in LEs  ?Seated hamstring stretch, 30 sec holds each LE ?Seated figure-four stretch 2x30 sec each LE. Pt reports this stretch reproduces stiffness sensation felt in BLEs when attempting standing marches.  ? ? ?PT instructs pt in HEP- ?Access Code: [email or web address] ?URL: [email or web address] ?Date: 06/18/2021 ?Prepared by: Ricard Dillon ? ?Exercises ?- Seated Piriformis Stretch  - 1 x daily - 7 x weekly - 2 sets - 2 reps - 30 hold ? ? PT Education - 06/18/21 1130   ? ? Education Details exercise technique, body mechanics, HEP   ? Person(s) Educated Patient   ? Methods Explanation;Demonstration;Verbal cues;Handout   ? Comprehension Verbalized understanding;Returned demonstration;Verbal cues required;Need further instruction   ? ?  ?  ? ?  ? ? ? PT Short Term Goals - 05/17/21 1140   ? ?  ? PT SHORT TERM GOAL #1  ? Title Pt will be independent with initial HEP in order to improve strength and balance in order to decrease fall risk and improve function at home and work.   ? Baseline Eval= no formal HEP in place   ? Time 6   ? Period Weeks   ? Status New   ? Target Date 08/09/21   ? ?  ?  ? ?  ? ? ? ? PT Long Term Goals - 05/17/21 1140   ? ?  ? PT LONG TERM GOAL #1  ? Title Pt will be independent with final HEP in order to improve strength and balance in order to decrease fall risk and improve function at home and work.   ? Baseline Eval= no formal HEP in place   ? Time 12   ? Period Weeks   ? Status New   ? Target Date 08/09/21   ?  ? PT LONG TERM GOAL #2  ? Title Pt will improve FOTO to target score of 58 to display perceived improvements in ability to complete ADL's   ? Baseline Eval= 53   ? Time 12   ? Period Weeks   ? Status New   ? Target Date 08/09/21   ?  ? PT LONG TERM GOAL #3  ? Title Pt will improve BERG by at least 3 points in order to demonstrate clinically significant improvement in balance.   ? Baseline Eval= 45/56   ? Time 12   ? Period Weeks   ? Status New   ? Target Date 08/09/21   ?  ? PT  LONG TERM GOAL #4  ? Title Pt will decrease 5TSTS by at least 3 seconds in order to demonstrate clinically significant improvement in LE strength.   ? Baseline Eval: 16.07 sec   ? Time 12   ? Period Weeks   ?  Status New   ? Target Date 08/09/21   ?  ? PT LONG TERM GOAL #5  ? Title Patient will demonstrate improved Left hip abd strength by performing 10 reps in sidelye against gravity for improved over hip strength with standing/walking.   ? Baseline Eval= Patient unable to complete a full ROM hip abd left LE   ? Time 12   ? Period Weeks   ? Status New   ? Target Date 08/09/21   ? ?  ?  ? ?  ? ? ? ? ? ? ? ? Plan - 06/18/21 1131   ? ? Clinical Impression Statement Pt with excellent motivation to participate in session. Pt able to progress volume of reps performed with therex. Pt did report stiffness with standing marches. Seated piriformis stretch successfully targeted this sensation and was assigned as part of HEP. Pt also able to perform more advanced static and dynamic balance interventions on this date. Pt succesfully completes balance interventions that do not require use of head turns. Pt most challenged with horizontal and vertical head turns. This will be area of focus in future sessions. The pt will benefit from further skilled PT to improve strength, balance, mobility and gait mechanics.   ? Personal Factors and Comorbidities Age   ? Examination-Activity Limitations Bend;Caring for Others;Carry;Lift;Squat;Stairs;Stand   ? Examination-Participation Restrictions Cleaning;Community Activity;Valla Leaver Work   ? Stability/Clinical Decision Making Evolving/Moderate complexity   ? Rehab Potential Good   ? PT Frequency 2x / week   ? PT Duration 12 weeks   ? PT Treatment/Interventions ADLs/Self Care Home Management;Canalith Repostioning;Cryotherapy;Electrical Stimulation;Moist Heat;Traction;DME Instruction;Gait training;Ultrasound;Stair training;Functional mobility training;Therapeutic activities;Therapeutic  exercise;Balance training;Neuromuscular re-education;Patient/family education;Manual techniques;Passive range of motion;Dry needling   ? PT Next Visit Plan Review and progress Hip strengtheing, Implement higher level balance

## 2021-06-20 ENCOUNTER — Ambulatory Visit: Payer: Medicare HMO | Admitting: Physical Therapy

## 2021-06-20 ENCOUNTER — Ambulatory Visit: Payer: Medicare HMO

## 2021-06-20 DIAGNOSIS — R2681 Unsteadiness on feet: Secondary | ICD-10-CM

## 2021-06-20 DIAGNOSIS — R269 Unspecified abnormalities of gait and mobility: Secondary | ICD-10-CM | POA: Diagnosis not present

## 2021-06-20 DIAGNOSIS — R262 Difficulty in walking, not elsewhere classified: Secondary | ICD-10-CM | POA: Diagnosis not present

## 2021-06-20 DIAGNOSIS — M6281 Muscle weakness (generalized): Secondary | ICD-10-CM | POA: Diagnosis not present

## 2021-06-20 NOTE — Therapy (Signed)
[facility] ?[place], [place], [address] ?Phone: [phone]   Fax:  [phone] ? ?Physical Therapy Treatment ? ?Patient Details  ?Name: Sheryl Miller ?MRN: [id number] ?Date of Birth: 1935-05-05 ?Referring Provider (PT): Dr. Donella Stade ? ? ?Encounter Date: 06/20/2021 ? ? PT End of Session - 06/20/21 1158   ? ? Visit Number 8   ? Number of Visits 25   ? Date for PT Re-Evaluation 08/09/21   ? Authorization Time Period Initial Cert 5/46/2703- 5/0/0938   ? Progress Note Due on Visit 10   ? PT Start Time [id number]   ? PT Stop Time 0930   ? PT Time Calculation (min) 36 min   ? Equipment Utilized During Treatment Gait belt   ? Activity Tolerance Patient tolerated treatment well;No increased pain   ? Behavior During Therapy [facility] for tasks assessed/performed   ? ?  ?  ? ?  ? ? ?Past Medical History:  ?Diagnosis Date  ? Allergic rhinitis, cause unspecified   ? Breast cyst   ? Diverticulosis of colon (without mention of hemorrhage)   ? Esophageal reflux   ? Female stress incontinence   ? Internal hemorrhoids without mention of complication   ? Osteoporosis, unspecified   ? Other and unspecified hyperlipidemia   ? Raynaud's syndrome   ? Rheumatic fever   ? Unspecified constipation   ? ? ?Past Surgical History:  ?Procedure Laterality Date  ? ABDOMINAL HYSTERECTOMY    ? BLADDER REPAIR    ? BREAST BIOPSY Right 2000  ? fibrocystic disease  ? KNEE ARTHROSCOPY Right ~5/16  ? SHOULDER ARTHROSCOPY WITH ROTATOR CUFF REPAIR AND SUBACROMIAL DECOMPRESSION  08/2001  ? Ulysees Barns  ? SHOULDER SURGERY    ?  In 2006, bilaterally shoulder surgery for rotator cuff.  ? TONSILLECTOMY    ? TUBAL LIGATION    ? ? ?There were no vitals filed for this visit. ? ? Subjective Assessment - 06/20/21 0854   ? ? Subjective Pt reports her balance has been OK. Reports ankles were sore but reports no pain currently.   ? Pertinent History Patient is a 86 year old female with reported  history of hip weakness left side and recent balance trouble with recent fall and trip later to urgent care. Patient has medical history of leg length discrepancy, Osteoporosis, rheumatic fever.   ? Limitations Walking;Standing;Lifting   ? How long can you sit comfortably? no issues   ? How long can you stand comfortably? I haven't measured but I think maybe 20 min if I had to   ? How long can you walk comfortably? about a mile maybe   ? Patient Stated Goals I want to walk better and not trip, stronger going up steps   ? Currently in Pain? No/denies   ? ?  ?  ? ?  ? ? ? ?INTERVENTIONS - pt late arrival, limits session. However, pt motivated to participate.  ?  ?NMR: Gait belt donned and CGA provided throughout unless otherwise specified  ? ?Pt standing in corner, NBOS vertical, horizontal head turns x multiple reps of each ? ?Dynamic Marching alternating LE x multiple reps. ?  ?Over 10 meter track: ?Ambulation with horizontal head turns - significant increase in variability of  BOS 4x ?Ambulation with =vertical head turns - significant increase in variability in BOS  4x ?   ?Therapeutic Exercises: ?5# weights donned each LE: ?Standing march 3x16 alternating LE, BUE support.  Pt reports improved stiffness sensation. Pt rates medium. ?LAQ 3x16 alternating LE. PT rates more challenging on her RLE compared to LLE. ?Seated figure-four stretch 2x30 sec each LE. ?Standing hip abduction 2x15 BLEs. Pt reports as fatiguing/challenging. ? ? ?PT adds to pt's HEP and instructs pt in safety and technique: ? ?Access Code: [email or web address] ?URL: [email or web address] ?Date: 06/20/2021 ?Prepared by: Ricard Dillon ? ?Exercises ?- Corner Balance Feet Apart: Eyes Closed With Head Turns  - 1 x daily - 7 x weekly - 3 sets - 10 reps -- written instructions indicate intervention to be performed with eyes open, feet together. Instructed pt to additionally have chair in front of her that will not move easily. Pt verbalized  understanding. ?  ?Access Code: [email or web address] ?URL: [email or web address] ?Date: 06/20/2021 ?Prepared by: Ricard Dillon ? ?Exercises ?- Standing Hip Abduction with Counter Support  - 1 x daily - 5 x weekly - 2 sets - 15 reps ? ?Pt educated throughout session about proper posture and technique with exercises. Improved exercise technique, movement at target joints, use of target muscles after min to mod verbal, visual, tactile cues. ? ? ? PT Education - 06/20/21 1158   ? ? Education Details exercise technique, body mechanics   ? Person(s) Educated Patient   ? Methods Explanation;Demonstration;Verbal cues;Handout   ? Comprehension Verbalized understanding;Returned demonstration;Need further instruction;Verbal cues required;Tactile cues required   ? ?  ?  ? ?  ? ? ? PT Short Term Goals - 05/17/21 1140   ? ?  ? PT SHORT TERM GOAL #1  ? Title Pt will be independent with initial HEP in order to improve strength and balance in order to decrease fall risk and improve function at home and work.   ? Baseline Eval= no formal HEP in place   ? Time 6   ? Period Weeks   ? Status New   ? Target Date 08/09/21   ? ?  ?  ? ?  ? ? ? ? PT Long Term Goals - 05/17/21 1140   ? ?  ? PT LONG TERM GOAL #1  ? Title Pt will be independent with final HEP in order to improve strength and balance in order to decrease fall risk and improve function at home and work.   ? Baseline Eval= no formal HEP in place   ? Time 12   ? Period Weeks   ? Status New   ? Target Date 08/09/21   ?  ? PT LONG TERM GOAL #2  ? Title Pt will improve FOTO to target score of 58 to display perceived improvements in ability to complete ADL's   ? Baseline Eval= 53   ? Time 12   ? Period Weeks   ? Status New   ? Target Date 08/09/21   ?  ? PT LONG TERM GOAL #3  ? Title Pt will improve BERG by at least 3 points in order to demonstrate clinically significant improvement in balance.   ? Baseline Eval= 45/56   ? Time 12   ? Period Weeks   ? Status New   ? Target Date  08/09/21   ?  ? PT LONG TERM GOAL #4  ? Title Pt will decrease 5TSTS by at least 3 seconds in order to demonstrate clinically significant improvement in LE strength.   ? Baseline Eval: 16.07 sec   ? Time 12   ? Period Weeks   ? Status New   ? Target Date 08/09/21   ?  ?  PT LONG TERM GOAL #5  ? Title Patient will demonstrate improved Left hip abd strength by performing 10 reps in sidelye against gravity for improved over hip strength with standing/walking.   ? Baseline Eval= Patient unable to complete a full ROM hip abd left LE   ? Time 12   ? Period Weeks   ? Status New   ? Target Date 08/09/21   ? ?  ?  ? ?  ? ? ? ? ? ? ? ? Plan - 06/20/21 1201   ? ? Clinical Impression Statement Continued focus on ambulating and performing static balance interventions with head turns. Pt continues to exhibit increased variability and BOS and decreased postural stability with these exercises. However, she requires no greater than CGA in session. Pt also found to fatigue quickly with standing hip abduction, which was added to her HEP. The pt will benefit from further skilled PT to improve strength, balance and mobility.   ? Personal Factors and Comorbidities Age   ? Examination-Activity Limitations Bend;Caring for Others;Carry;Lift;Squat;Stairs;Stand   ? Examination-Participation Restrictions Cleaning;Community Activity;Valla Leaver Work   ? Stability/Clinical Decision Making Evolving/Moderate complexity   ? Rehab Potential Good   ? PT Frequency 2x / week   ? PT Duration 12 weeks   ? PT Treatment/Interventions ADLs/Self Care Home Management;Canalith Repostioning;Cryotherapy;Electrical Stimulation;Moist Heat;Traction;DME Instruction;Gait training;Ultrasound;Stair training;Functional mobility training;Therapeutic activities;Therapeutic exercise;Balance training;Neuromuscular re-education;Patient/family education;Manual techniques;Passive range of motion;Dry needling   ? PT Next Visit Plan Review and progress Hip strengtheing, Implement higher  level balance activities- static/dynamic, continue plan   ? PT Home Exercise Plan 05/17/2021=Access Code: [email or web address]  URL: [email or web address]; 06/05/2021=Access Code: [email or web address]  URL: [email or web address]

## 2021-06-24 ENCOUNTER — Encounter: Payer: Self-pay | Admitting: Family Medicine

## 2021-06-24 ENCOUNTER — Ambulatory Visit (INDEPENDENT_AMBULATORY_CARE_PROVIDER_SITE_OTHER): Payer: Medicare HMO | Admitting: Family Medicine

## 2021-06-24 VITALS — BP 118/60 | HR 68 | Temp 97.8°F | Ht 61.75 in | Wt 157.6 lb

## 2021-06-24 DIAGNOSIS — R2689 Other abnormalities of gait and mobility: Secondary | ICD-10-CM

## 2021-06-24 DIAGNOSIS — R269 Unspecified abnormalities of gait and mobility: Secondary | ICD-10-CM

## 2021-06-24 DIAGNOSIS — M2170 Unequal limb length (acquired), unspecified site: Secondary | ICD-10-CM | POA: Diagnosis not present

## 2021-06-24 NOTE — Patient Instructions (Signed)
?  Try getting some heel cups / heel pads for your heel.  Then you can move them between different shoes.  ? ?I like the [employer] brand, but basically anything will work.  ? ?You can buy on [employer]. ?- get the yellow "classic" style, but only put it on the right side.  ?

## 2021-06-24 NOTE — Progress Notes (Signed)
? ? ?Mercadez Heitman T. Tajee Savant, MD, CAQ Sports Medicine ?Nature conservation officer at [facility] ?[address] [place] ?[place], [address] ? ?Phone: [phone]  FAX: [phone] ? ?Sheryl Miller - 86 y.o. female  MRN [id number]  Date of Birth: 26-Aug-1935 ? ?Date: 06/24/2021  PCP: Karie Schwalbe, MD  Referral: Karie Schwalbe, MD ? ?Chief Complaint  ?Patient presents with  ? Follow-up  ?  Balance/Gait  ? ? ?This visit occurred during the SARS-CoV-2 public health emergency.  Safety protocols were in place, including screening questions prior to the visit, additional usage of staff PPE, and extensive cleaning of exam room while observing appropriate contact time as indicated for disinfecting solutions.  ? ?Subjective:  ? ?Sheryl Miller is a 86 y.o. very pleasant female patient with Body mass index is 29.05 kg/m?. who presents with the following: ? ?She is a very nice lady, and I recently saw her in February 2023 with some imbalance and gait problems. ? ?In the past have seen her for various other issues including knee pain, neck pain, as well as some shoulder and flank pain. ? ?Last time I saw her, she was incredibly weak on examination, and I sent her to formal physical therapy. ? ?She is here today for recheck and follow-up. ? ?She does feel like she has making improvement with her formal physical therapy as well as strengthening.  Her gait is still off somewhat, and she does know that she has some weakness, predominantly at the hip. ? ?Review of Systems is noted in the HPI, as appropriate ? ?Objective:  ? ?BP 118/60   Pulse 68   Temp 97.8 ?F (36.6 ?C) (Oral)   Ht 5' 1.75" (1.568 m)   Wt 157 lb 9 oz (71.5 kg)   SpO2 97%   BMI 29.05 kg/m?  ? ?GEN: No acute distress; alert,appropriate. ?PULM: Breathing comfortably in no respiratory distress ?PSYCH: Normally interactive.  ? ? ?Right leg is roughly 1 cm shorter than the contralateral side. ? ?She does walk with some hip instability and Trendelenburg  gait. ?Hip range of motion is full.  No pain with internal range of motion or external range of motion. ? ?Hip strength remains 4/5 in all directions. ?Minimally tender at the trochanteric bursa.  No tenderness along the paraspinous musculature or the spinous processes in the lower back. ? ?Otherwise neurovascularly intact. ? ?Laboratory and Imaging Data: ? ?Assessment and Plan:  ? ?  ICD-10-CM   ?1. Gait abnormality  R26.9   ?  ?2. Balance problem  R26.89   ?  ?3. Leg length discrepancy  M21.70   ?  ? ?I still think that the primary issue here is weakness in the core as well as for hip stability with walking. ? ?I do think that she has a leg length discrepancy that is making a difference here.  She did have her insoles as well as an additional half insole for cushioning bilaterally.  I removed the left half insole.  Also recommended that she do a heel cup in addition on the right side for further elevation.  Think that this would be easy to do and transferable between shoes. ? ?Continue PT and home exercise program. ? ?Follow-up as needed ? ? ? ?Dragon [employer] One speech-to-text software was used for transcription in this dictation.  Possible transcriptional errors can occur using Animal nutritionist.  ? ?Signed, ? ?Maloree Uplinger T. Shavonna Corella, MD ? ? ?Outpatient Encounter Medications as of 06/24/2021  ?Medication Sig  ?  brimonidine (ALPHAGAN) 0.2 % ophthalmic solution 1 drop 2 (two) times daily.  ? diclofenac Sodium (VOLTAREN) 1 % GEL diclofenac 1 % topical gel  ? fexofenadine (ALLEGRA) 180 MG tablet Take 180 mg by mouth daily.  ? ketoconazole (NIZORAL) 2 % shampoo MASSAGE INTO SCALP EVERY OTHER DAY, LET SIT SEVERAL MINUTES BEFORE RINSING.  ? mometasone (ELOCON) 0.1 % lotion APPLY TO AFFECTED AREAS OF SCALP TWICE DAILY UNTIL IMPROVED AND AS NEEDED FOR RECURRENCE.  ? Multiple Vitamins-Minerals (CENTRUM SILVER ULTRA WOMENS) TABS Take 1 tablet by mouth daily.   ? POTASSIUM PO Take by mouth daily.  ? Probiotic Product (PROBIOTIC  PO) Take by mouth.  ? simvastatin (ZOCOR) 40 MG tablet TAKE 1 TABLET AT BEDTIME  ? timolol (TIMOPTIC) 0.5 % ophthalmic solution   ? traZODone (DESYREL) 150 MG tablet TAKE 1 TABLET AT BEDTIME AS NEEDED  FOR  SLEEP  ? TURMERIC PO Take by mouth.  ? [DISCONTINUED] triamcinolone (KENALOG) 0.1 % APPLY TO AFFECTED AREAS OF RASH ON BACK 1 TO 2 TIMES DAILY UNTIL IMPROVED. AVOID FACE, GROIN, AXILLA.  ? ?No facility-administered encounter medications on file as of 06/24/2021.  ?  ?

## 2021-06-25 ENCOUNTER — Ambulatory Visit: Payer: Medicare HMO | Admitting: Physical Therapy

## 2021-06-25 ENCOUNTER — Ambulatory Visit: Payer: Medicare HMO

## 2021-06-25 DIAGNOSIS — M6281 Muscle weakness (generalized): Secondary | ICD-10-CM

## 2021-06-25 DIAGNOSIS — R2681 Unsteadiness on feet: Secondary | ICD-10-CM | POA: Diagnosis not present

## 2021-06-25 DIAGNOSIS — R269 Unspecified abnormalities of gait and mobility: Secondary | ICD-10-CM | POA: Diagnosis not present

## 2021-06-25 DIAGNOSIS — R262 Difficulty in walking, not elsewhere classified: Secondary | ICD-10-CM

## 2021-06-25 NOTE — Therapy (Signed)
[place] [facility] ?[place], [place], [address] ?Phone: [phone]   Fax:  [phone] ? ?Physical Therapy Treatment ? ?Patient Details  ?Name: Sheryl Miller ?MRN: [id number] ?Date of Birth: Jan 10, 1936 ?Referring Provider (PT): Dr. Donella Stade ? ? ?Encounter Date: 06/25/2021 ? ? PT End of Session - 06/25/21 1026   ? ? Visit Number 9   ? Number of Visits 25   ? Date for PT Re-Evaluation 08/09/21   ? Authorization Type [employer]   ? Authorization Time Period 05/17/2021- 08/09/2021   ? Progress Note Due on Visit 10   ? PT Start Time 1020   ? PT Stop Time 1100   ? PT Time Calculation (min) 40 min   ? Activity Tolerance Patient tolerated treatment well;No increased pain;Patient limited by fatigue   ? Behavior During Therapy [facility] for tasks assessed/performed   ? ?  ?  ? ?  ? ? ?Past Medical History:  ?Diagnosis Date  ? Allergic rhinitis, cause unspecified   ? Breast cyst   ? Diverticulosis of colon (without mention of hemorrhage)   ? Esophageal reflux   ? Female stress incontinence   ? Internal hemorrhoids without mention of complication   ? Osteoporosis, unspecified   ? Other and unspecified hyperlipidemia   ? Raynaud's syndrome   ? Rheumatic fever   ? Unspecified constipation   ? ? ?Past Surgical History:  ?Procedure Laterality Date  ? ABDOMINAL HYSTERECTOMY    ? BLADDER REPAIR    ? BREAST BIOPSY Right 2000  ? fibrocystic disease  ? KNEE ARTHROSCOPY Right ~5/16  ? SHOULDER ARTHROSCOPY WITH ROTATOR CUFF REPAIR AND SUBACROMIAL DECOMPRESSION  08/2001  ? Ulysees Barns  ? SHOULDER SURGERY    ?  In 2006, bilaterally shoulder surgery for rotator cuff.  ? TONSILLECTOMY    ? TUBAL LIGATION    ? ? ?There were no vitals filed for this visit. ? ? Subjective Assessment - 06/25/21 1024   ? ? Subjective Pt doing well in general. Saw doctor yesterday who gave her a heel lift for right side.   ? Pertinent History Patient is a 86 year old female with reported  history of hip weakness left side and recent balance trouble with recent fall and trip later to urgent care. Patient has medical history of leg length discrepancy, Osteoporosis, rheumatic fever.   ? Currently in Pain? No/denies   ? ?  ?  ? ?  ? ? ? ?INTERVENTION THIS DATE:  ?-seated FIG 4 stretch 2x30sec bilat ?-seated LAQ 1x15 @ 5lb AW  ?-standing marching 1x10 @ 5lb AW bilat  ?-standing hip ABDCT 1x10 @ 5lb AW bilat  ?-standing hip extension (straight leg) 1x10 bilat c 5lb AW ? ?FOTO survey 06/25/21: 53  (53 at eval)  ?5xSTS: 10.99sec ? ?-STS from chair 1x10, hands free ?-lateral side stepping 5lb AW 3 RT in // bars ?-standing hip extension (straight leg) 1x10 bilat c 5lb AW ?-lateral side stepping 5lb AW 3 RT in // bars (greater effort on left, greater compensation on left)  ? ?-balance beam walking in // bars, 5RT, hands prn ?-lateral airex beam walking  5 RT, hands ad lib  ? ?-------------------------------------------------------------- ? ? ?Access Code: [email or web address] ?URL: [email or web address] ?Date: 06/20/2021 ?Prepared by: Ricard Dillon ?  ?Exercises ?- Corner Balance Feet Apart: Eyes Closed With Head Turns  - 1 x daily - 7 x weekly - 3 sets - 10 reps -- written instructions indicate  intervention to be performed with eyes open, feet together. Instructed pt to additionally have chair in front of her that will not move easily. Pt verbalized understanding. ?  ?Access Code: [email or web address] ?URL: [email or web address] ?Date: 06/20/2021 ?Prepared by: Ricard Dillon ?  ?Exercises ?- Standing Hip Abduction with Counter Support  - 1 x daily - 5 x weekly - 2 sets - 15 reps ?  ? ? PT Education - 06/25/21 1027   ? ? Education Details technique for exercise   ? Person(s) Educated Patient   ? Methods Explanation   ? Comprehension Verbalized understanding   ? ?  ?  ? ?  ? ? ? PT Short Term Goals - 06/25/21 1046   ? ?  ? PT SHORT TERM GOAL #1  ? Title Pt will be independent with initial HEP in order to improve  strength and balance in order to decrease fall risk and improve function at home and work.   ? Baseline Eval= no formal HEP in place   ? Time 6   ? Period Weeks   ? Status Achieved   ? Target Date 08/09/21   ? ?  ?  ? ?  ? ? ? ? PT Long Term Goals - 06/25/21 1040   ? ?  ? PT LONG TERM GOAL #1  ? Title Pt will be independent with final HEP in order to improve strength and balance in order to decrease fall risk and improve function at home and work.   ? Baseline Eval= no formal HEP in place   ? Time 12   ? Period Weeks   ? Status On-going   ? Target Date 08/09/21   ?  ? PT LONG TERM GOAL #2  ? Title Pt will improve FOTO to target score of 58 to display perceived improvements in ability to complete ADL's   ? Baseline Eval: 53; 06/25/21; 53 at eval   ? Time 12   ? Status On-going   ? Target Date 08/09/21   ?  ? PT LONG TERM GOAL #3  ? Title Pt will improve BERG by at least 3 points in order to demonstrate clinically significant improvement in balance.   ? Baseline Eval: 45/56   ? Time 12   ? Period Weeks   ? Status New   ? Target Date 08/09/21   ?  ? PT LONG TERM GOAL #4  ? Title Pt will decrease 5TSTS by at least 3 seconds in order to demonstrate clinically significant improvement in LE strength.   ? Baseline Eval: 16.07 sec; 06/25/21: 10.99   ? Time 12   ? Period Weeks   ? Status On-going   ? Target Date 08/09/21   ?  ? PT LONG TERM GOAL #5  ? Title Patient will demonstrate improved Left hip abd strength by performing 10 reps in sidelye against gravity for improved over hip strength with standing/walking.   ? Baseline Eval= Patient unable to complete a full ROM hip abd left LE   ? Time 12   ? Period Weeks   ? Status On-going   ? Target Date 08/09/21   ? ?  ?  ? ?  ? ? ? ? ? ? ? ? Plan - 06/25/21 1027   ? ? Clinical Impression Statement Continued with BLE strengthening and motor control. Hips remain weak and with well developed compensation. Pt able to perform entire session with only minimal need for recovery intervals.  Pt notices some small improvements in  balance, but largely still easily imbalanced with slow walking. Began some reassessment measures in preparation for 10th visit-FOTO score unchanged from initial, 5xSTS with remarkable improvement.     ? Personal Factors and Comorbidities Age   ? Examination-Activity Limitations Bend;Caring for Others;Carry;Lift;Squat;Stairs;Stand   ? Examination-Participation Restrictions Cleaning;Community Activity;Valla Leaver Work   ? Stability/Clinical Decision Making Evolving/Moderate complexity   ? Clinical Decision Making Low   ? Rehab Potential Good   ? PT Frequency 2x / week   ? PT Duration 12 weeks   ? PT Treatment/Interventions ADLs/Self Care Home Management;Canalith Repostioning;Cryotherapy;Electrical Stimulation;Moist Heat;Traction;DME Instruction;Gait training;Ultrasound;Stair training;Functional mobility training;Therapeutic activities;Therapeutic exercise;Balance training;Neuromuscular re-education;Patient/family education;Manual techniques;Passive range of motion;Dry needling   ? PT Next Visit Plan Review and progress Hip strengtheing, Implement higher level balance activities- static/dynamic, continue plan   ? PT Home Exercise Plan 05/17/2021=Access Code: [email or web address]  URL: [email or web address]; 06/05/2021=Access Code: [email or web address]  URL: [email or web address]; 4/11: Access Code: [email or web address]; 4/13: [email or web address]   ? Consulted and Agree with Plan of Care Patient   ? ?  ?  ? ?  ? ? ?Patient will benefit from skilled therapeutic intervention in order to improve the following deficits and impairments:  Abnormal gait, Decreased activity tolerance, Decreased balance, Decreased endurance, Decreased mobility, Decreased strength, Difficulty walking, Hypomobility, Impaired flexibility ? ?Visit Diagnosis: ?Unsteadiness on feet ? ?Muscle weakness (generalized) ? ?Difficulty in walking, not elsewhere classified ? ?Abnormality of gait and mobility ? ? ? ? ?Problem List ?Patient Active  Problem List  ? Diagnosis Date Noted  ? Neuropathy 10/29/2018  ? Generalized osteoarthritis 07/10/2017  ? Advance directive discussed with patient 02/13/2016  ? Family history of breast cancer 07/20/2015  ? Hy

## 2021-06-26 ENCOUNTER — Other Ambulatory Visit: Payer: Self-pay | Admitting: Dermatology

## 2021-06-27 ENCOUNTER — Ambulatory Visit: Payer: Medicare HMO

## 2021-06-27 DIAGNOSIS — R262 Difficulty in walking, not elsewhere classified: Secondary | ICD-10-CM | POA: Diagnosis not present

## 2021-06-27 DIAGNOSIS — R269 Unspecified abnormalities of gait and mobility: Secondary | ICD-10-CM

## 2021-06-27 DIAGNOSIS — M6281 Muscle weakness (generalized): Secondary | ICD-10-CM

## 2021-06-27 DIAGNOSIS — R2681 Unsteadiness on feet: Secondary | ICD-10-CM

## 2021-06-27 NOTE — Therapy (Addendum)
[facility] ?[place], [place], [address] ?Phone: [phone]   Fax:  [phone] ? ?Physical Therapy Treatment Physical Therapy Progress Note ? ? ?Dates of reporting period  05/17/21   to   06/27/21 ? ? ?Patient Details  ?Name: Sheryl Miller ?MRN: [id number] ?Date of Birth: 1935-10-27 ?Referring Provider (PT): Dr. Donella Stade ? ? ?Encounter Date: 06/27/2021 ? ? PT End of Session - 06/27/21 0946   ? ? Visit Number 10   ? Number of Visits 25   ? Date for PT Re-Evaluation 08/09/21   ? Authorization Type [employer]   ? Authorization Time Period 05/17/2021- 08/09/2021   ? Progress Note Due on Visit 10   ? PT Start Time [phone]   ? PT Stop Time 1015   ? PT Time Calculation (min) 39 min   ? Equipment Utilized During Treatment Gait belt   ? Activity Tolerance Patient tolerated treatment well;No increased pain;Patient limited by fatigue   ? Behavior During Therapy [facility] for tasks assessed/performed   ? ?  ?  ? ?  ? ? ?Past Medical History:  ?Diagnosis Date  ? Allergic rhinitis, cause unspecified   ? Breast cyst   ? Diverticulosis of colon (without mention of hemorrhage)   ? Esophageal reflux   ? Female stress incontinence   ? Internal hemorrhoids without mention of complication   ? Osteoporosis, unspecified   ? Other and unspecified hyperlipidemia   ? Raynaud's syndrome   ? Rheumatic fever   ? Unspecified constipation   ? ? ?Past Surgical History:  ?Procedure Laterality Date  ? ABDOMINAL HYSTERECTOMY    ? BLADDER REPAIR    ? BREAST BIOPSY Right 2000  ? fibrocystic disease  ? KNEE ARTHROSCOPY Right ~5/16  ? SHOULDER ARTHROSCOPY WITH ROTATOR CUFF REPAIR AND SUBACROMIAL DECOMPRESSION  08/2001  ? Ulysees Barns  ? SHOULDER SURGERY    ?  In 2006, bilaterally shoulder surgery for rotator cuff.  ? TONSILLECTOMY    ? TUBAL LIGATION    ? ? ?There were no vitals filed for this visit. ? ? Subjective Assessment - 06/27/21 0941   ? ? Subjective Pt notes she is doing  fairly well.  Pt notes that the heel lift given by her MD has been beneficial.   ? Pertinent History Patient is a 86 year old female with reported history of hip weakness left side and recent balance trouble with recent fall and trip later to urgent care. Patient has medical history of leg length discrepancy, Osteoporosis, rheumatic fever.   ? ?  ?  ? ?  ? ? ? ? ? [facility] PT Assessment - 06/27/21 0954   ? ?  ? Standardized Balance Assessment  ? Standardized Balance Assessment Berg Balance Test   ?  ? Berg Balance Test  ? Sit to Stand Able to stand without using hands and stabilize independently   ? Standing Unsupported Able to stand safely 2 minutes   ? Sitting with Back Unsupported but Feet Supported on Floor or Stool Able to sit safely and securely 2 minutes   ? Stand to Sit Sits safely with minimal use of hands   ? Transfers Able to transfer safely, minor use of hands   ? Standing Unsupported with Eyes Closed Able to stand 10 seconds safely   ? Standing Unsupported with Feet Together Able to place feet together independently and stand 1 minute safely   ? From Standing, Reach Forward  with Outstretched Arm Can reach confidently >25 cm (10")   ? From Standing Position, Pick up Object from [place] to pick up shoe safely and easily   ? From Standing Position, Turn to Look Behind Over each Shoulder Looks behind from both sides and weight shifts well   ? Turn 360 Degrees Able to turn 360 degrees safely in 4 seconds or less   ? Standing Unsupported, Alternately Place Feet on Step/Stool Able to stand independently and safely and complete 8 steps in 20 seconds   ? Standing Unsupported, One Foot in [place] to place foot tandem independently and hold 30 seconds   ? Standing on One Leg Able to lift leg independently and hold 5-10 seconds   ? Total Score 55   ? ?  ?  ? ?  ? ? ? ? ? ? ? ? ?INTERVENTIONS ?  ?INTERVENTION THIS DATE:  ?-seated FIG 4 stretch 2x30sec bilat ?-seated LAQ 2x15 @ 5lb AW  ?-standing marching 1x10 @  5lb AW bilat  ?-standing hip ABDCT 1x10 @ 5lb AW bilat  ?-standing hip extension (straight leg) 2x10 bilat with 5lb AW ?-STS from chair 1x10, hands free ?-lateral side stepping 5lb AW 3 RT in // bars ?-standing hip extension (straight leg) 1x10 bilat c 5lb AW ? ?BERG: 55/56 ? ? ? ? ? ? ? ? ? ? ? PT Short Term Goals - 06/25/21 1046   ? ?  ? PT SHORT TERM GOAL #1  ? Title Pt will be independent with initial HEP in order to improve strength and balance in order to decrease fall risk and improve function at home and work.   ? Baseline Eval= no formal HEP in place   ? Time 6   ? Period Weeks   ? Status Achieved   ? Target Date 08/09/21   ? ?  ?  ? ?  ? ? ? ? PT Long Term Goals - 06/27/21 1005   ? ?  ? PT LONG TERM GOAL #1  ? Title Pt will be independent with final HEP in order to improve strength and balance in order to decrease fall risk and improve function at home and work.   ? Baseline Eval= no formal HEP in place   ? Time 12   ? Period Weeks   ? Status On-going   ? Target Date 08/09/21   ?  ? PT LONG TERM GOAL #2  ? Title Pt will improve FOTO to target score of 58 to display perceived improvements in ability to complete ADL's   ? Baseline Eval: 53; 06/25/21; 53 at eval   ? Time 12   ? Status On-going   ? Target Date 08/09/21   ?  ? PT LONG TERM GOAL #3  ? Title Pt will improve BERG by at least 3 points in order to demonstrate clinically significant improvement in balance.   ? Baseline Eval: 45/56; 4/20: 55/56   ? Time 12   ? Period Weeks   ? Status New   ? Target Date 08/09/21   ?  ? PT LONG TERM GOAL #4  ? Title Pt will decrease 5TSTS by at least 3 seconds in order to demonstrate clinically significant improvement in LE strength.   ? Baseline Eval: 16.07 sec; 06/25/21: 10.99   ? Time 12   ? Period Weeks   ? Status On-going   ? Target Date 08/09/21   ?  ? PT LONG TERM GOAL #5  ?  Title Patient will demonstrate improved Left hip abd strength by performing 10 reps in sidelying against gravity for improved over hip  strength with standing/walking.   ? Baseline Eval= Patient unable to complete a full ROM hip abd left LE; 4/20: Pt fatigues after 5 performed with proper cuing   ? Time 12   ? Period Weeks   ? Status On-going   ? Target Date 08/09/21   ? ?  ?  ? ?  ? ? ? ? ? ? ? ? Plan - 06/27/21 0953   ? ? Clinical Impression Statement Pt participated in goal assessment as part of progress note.  Pt is making significant improvements as noted below.  Pt able to achieve 55/56 on the BERG balance scale which has been a 10 point improvement over therapy session.  Pt still demonstrating hip weakness as noted by inability to perform side-lying hip abduction against gravity with proper form.  Pt will continue to benefit from skilled therapy to address remaining goals listed below, in order to return to PLOF and decrease risk of future falls.   ? Personal Factors and Comorbidities Age   ? Examination-Activity Limitations Bend;Caring for Others;Carry;Lift;Squat;Stairs;Stand   ? Examination-Participation Restrictions Cleaning;Community Activity;Valla Le[employer] Work   ? Stability/Clinical Decision Making Evolving/Moderate complexity   ? Rehab Potential Good   ? PT Frequency 2x / week   ? PT Duration 12 weeks   ? PT Treatment/Interventions ADLs/Self Care Home Management;Canalith Repostioning;Cryotherapy;Electrical Stimulation;Moist Heat;Traction;DME Instruction;Gait training;Ultrasound;Stair training;Functional mobility training;Therapeutic activities;Therapeutic exercise;Balance training;Neuromuscular re-education;Patient/family education;Manual techniques;Passive range of motion;Dry needling   ? PT Next Visit Plan Review and progress Hip strengtheing, Implement higher level balance activities- static/dynamic, continue plan   ? PT Home Exercise Plan 05/17/2021=Access Code: [email or web address]  URL: [email or web address]; 06/05/2021=Access Code: [email or web address]  URL: [email or web address]; 4/11: Access Code: [email or web address]; 4/13: [email or web address]   ?  Consulted and Agree with Plan of Care Patient   ? ?  ?  ? ?  ? ? ?Patient will benefit from skilled therapeutic intervention in order to improve the following deficits and impairments:  Abnormal gait, Decreased activ

## 2021-07-01 ENCOUNTER — Ambulatory Visit: Payer: Medicare HMO

## 2021-07-01 DIAGNOSIS — R262 Difficulty in walking, not elsewhere classified: Secondary | ICD-10-CM

## 2021-07-01 DIAGNOSIS — R269 Unspecified abnormalities of gait and mobility: Secondary | ICD-10-CM | POA: Diagnosis not present

## 2021-07-01 DIAGNOSIS — M6281 Muscle weakness (generalized): Secondary | ICD-10-CM

## 2021-07-01 DIAGNOSIS — R2681 Unsteadiness on feet: Secondary | ICD-10-CM | POA: Diagnosis not present

## 2021-07-01 NOTE — Therapy (Signed)
[place] [facility] ?[place], [place], [address] ?Phone: [phone]   Fax:  [phone] ? ?Physical Therapy Treatment ? ?Patient Details  ?Name: Sheryl Miller ?MRN: [id number] ?Date of Birth: [birth year] ?Referring Provider (PT): Dr. Donella Stade ? ? ?Encounter Date: 07/01/2021 ? ? PT End of Session - 07/01/21 1134   ? ? Visit Number 11   ? Number of Visits 25   ? Date for PT Re-Evaluation 08/09/21   ? Authorization Type [employer]   ? Authorization Time Period 05/17/2021- 08/09/2021   ? Progress Note Due on Visit 10   ? PT Start Time 1006   ? PT Stop Time 1041   ? PT Time Calculation (min) 35 min   ? Equipment Utilized During Treatment Gait belt   ? Activity Tolerance Patient tolerated treatment well;No increased pain   ? Behavior During Therapy [facility] for tasks assessed/performed   ? ?  ?  ? ?  ? ? ?Past Medical History:  ?Diagnosis Date  ? Allergic rhinitis, cause unspecified   ? Breast cyst   ? Diverticulosis of colon (without mention of hemorrhage)   ? Esophageal reflux   ? Female stress incontinence   ? Internal hemorrhoids without mention of complication   ? Osteoporosis, unspecified   ? Other and unspecified hyperlipidemia   ? Raynaud's syndrome   ? Rheumatic fever   ? Unspecified constipation   ? ? ?Past Surgical History:  ?Procedure Laterality Date  ? ABDOMINAL HYSTERECTOMY    ? BLADDER REPAIR    ? BREAST BIOPSY Right 2000  ? fibrocystic disease  ? KNEE ARTHROSCOPY Right ~5/16  ? SHOULDER ARTHROSCOPY WITH ROTATOR CUFF REPAIR AND SUBACROMIAL DECOMPRESSION  08/2001  ? Ulysees Barns  ? SHOULDER SURGERY    ?  In 2006, bilaterally shoulder surgery for rotator cuff.  ? TONSILLECTOMY    ? TUBAL LIGATION    ? ? ?There were no vitals filed for this visit. ? ? Subjective Assessment - 07/01/21 1004   ? ? Subjective Pt reports no pain currently. She reports her balance is a little better. Pt reports no falls or stumbles. Pt reports her HEP has been  going well. Hip strength remains her primary concern.   ? Pertinent History Patient is a 86 year old female with reported history of hip weakness left side and recent balance trouble with recent fall and trip later to urgent care. Patient has medical history of leg length discrepancy, Osteoporosis, rheumatic fever.   ? Limitations Walking;Standing;Lifting   ? How long can you sit comfortably? no issues   ? How long can you stand comfortably? I haven't measured but I think maybe 20 min if I had to   ? How long can you walk comfortably? about a mile maybe   ? Patient Stated Goals I want to walk better and not trip, stronger going up steps   ? Currently in Pain? No/denies   ? ?  ?  ? ?  ? ? ?INTERVENTIONS ?  ? ?-seated FIG 4 stretch 2x30sec bilat ? ?-seated LAQ 2x15 each LE @ 7lb AW  ? ?-standing marching 2x15 each LE @ 7lb AW bilat ? ?-side-lying hip abduction 2x10  bilat  ? ?- leg press  ?25# 10x bilat ?40# 10x bilat ?25# LLE only 2x12 ? ? ?-standing heel raises 18x; pt rates hard  ? 8x each LE isolated  ? 8x RLE only  ? ? ?- standing DF 10x  bilat ? 10x isolated each LE  ? ?- standing gastrocsol stretch 30 sec B ? ?Pt educated throughout session about proper posture and technique with exercises. Improved exercise technique, movement at target joints, use of target muscles after min to mod verbal, visual, tactile cues. ? ? ? ? PT Education - 07/01/21 1134   ? ? Education Details exercise technique   ? Person(s) Educated Patient   ? Methods Explanation;Demonstration;Verbal cues   ? Comprehension Verbalized understanding;Returned demonstration;Need further instruction;Verbal cues required   ? ?  ?  ? ?  ? ? ? PT Short Term Goals - 06/25/21 1046   ? ?  ? PT SHORT TERM GOAL #1  ? Title Pt will be independent with initial HEP in order to improve strength and balance in order to decrease fall risk and improve function at home and work.   ? Baseline Eval= no formal HEP in place   ? Time 6   ? Period Weeks   ? Status Achieved    ? Target Date 08/09/21   ? ?  ?  ? ?  ? ? ? ? PT Long Term Goals - 06/27/21 1005   ? ?  ? PT LONG TERM GOAL #1  ? Title Pt will be independent with final HEP in order to improve strength and balance in order to decrease fall risk and improve function at home and work.   ? Baseline Eval= no formal HEP in place   ? Time 12   ? Period Weeks   ? Status On-going   ? Target Date 08/09/21   ?  ? PT LONG TERM GOAL #2  ? Title Pt will improve FOTO to target score of 58 to display perceived improvements in ability to complete ADL's   ? Baseline Eval: 53; 06/25/21; 53 at eval   ? Time 12   ? Status On-going   ? Target Date 08/09/21   ?  ? PT LONG TERM GOAL #3  ? Title Pt will improve BERG by at least 3 points in order to demonstrate clinically significant improvement in balance.   ? Baseline Eval: 45/56; 4/20: 55/56   ? Time 12   ? Period Weeks   ? Status New   ? Target Date 08/09/21   ?  ? PT LONG TERM GOAL #4  ? Title Pt will decrease 5TSTS by at least 3 seconds in order to demonstrate clinically significant improvement in LE strength.   ? Baseline Eval: 16.07 sec; 06/25/21: 10.99   ? Time 12   ? Period Weeks   ? Status On-going   ? Target Date 08/09/21   ?  ? PT LONG TERM GOAL #5  ? Title Patient will demonstrate improved Left hip abd strength by performing 10 reps in sidelying against gravity for improved over hip strength with standing/walking.   ? Baseline Eval= Patient unable to complete a full ROM hip abd left LE; 4/20: Pt fatigues after 5 performed with proper cuing   ? Time 12   ? Period Weeks   ? Status On-going   ? Target Date 08/09/21   ? ?  ?  ? ?  ? ? ? ? ? ? ? ? Plan - 07/01/21 1026   ? ? Clinical Impression Statement Session slighlty limited secondary to pt needing to leave early for her spouses's dr appointment. However, pt with excellent motivation to participate in session. Pt able to progress level of resistance used with therex, indicating improved BLE strength.  Pt still with mixed strength deficits: LLE  abduction<RLE abduction, RLE heel raise<LLE heel raise. Pt still with notable R side shift and hip drop with gait.  Pt will benefit from further skilled PT to address these deficits in order to improve strength, balance and mobility.   ? Personal Factors and Comorbidities Age   ? Examination-Activity Limitations Bend;Caring for Others;Carry;Lift;Squat;Stairs;Stand   ? Examination-Participation Restrictions Cleaning;Community Activity;Valla Leaver Work   ? Stability/Clinical Decision Making Evolving/Moderate complexity   ? Rehab Potential Good   ? PT Frequency 2x / week   ? PT Duration 12 weeks   ? PT Treatment/Interventions ADLs/Self Care Home Management;Canalith Repostioning;Cryotherapy;Electrical Stimulation;Moist Heat;Traction;DME Instruction;Gait training;Ultrasound;Stair training;Functional mobility training;Therapeutic activities;Therapeutic exercise;Balance training;Neuromuscular re-education;Patient/family education;Manual techniques;Passive range of motion;Dry needling   ? PT Next Visit Plan Review and progress Hip strengtheing, Implement higher level balance activities- static/dynamic, continue plan   ? PT Home Exercise Plan 05/17/2021=Access Code: [email or web address]  URL: [email or web address]; 06/05/2021=Access Code: [email or web address]  URL: [email or web address]; 4/11: Access Code: [email or web address]; 4/13: [email or web address]   ? Consulted and Agree with Plan of Care Patient   ? ?  ?  ? ?  ? ? ?Patient will benefit from skilled therapeutic intervention in order to improve the following deficits and impairments:  Abnormal gait, Decreased activity tolerance, Decreased balance, Decreased endurance, Decreased mobility, Decreased strength, Difficulty walking, Hypomobility, Impaired flexibility ? ?Visit Diagnosis: ?Muscle weakness (generalized) ? ?Difficulty in walking, not elsewhere classified ? ? ? ? ?Problem List ?Patient Active Problem List  ? Diagnosis Date Noted  ? Neuropathy 10/29/2018  ? Generalized osteoarthritis  07/10/2017  ? Advance directive discussed with patient 02/13/2016  ? Family history of breast cancer 07/20/2015  ? Hyperlipemia 02/12/2015  ? Sleep disturbance 04/20/2014  ? Balance problem 01/26/2013  ? [place]

## 2021-07-02 ENCOUNTER — Ambulatory Visit: Payer: Medicare HMO

## 2021-07-03 ENCOUNTER — Ambulatory Visit: Payer: Medicare HMO

## 2021-07-04 ENCOUNTER — Ambulatory Visit: Payer: Medicare HMO

## 2021-07-09 ENCOUNTER — Ambulatory Visit: Payer: Medicare HMO | Attending: Family Medicine

## 2021-07-09 ENCOUNTER — Ambulatory Visit: Payer: Medicare HMO

## 2021-07-09 DIAGNOSIS — R262 Difficulty in walking, not elsewhere classified: Secondary | ICD-10-CM | POA: Diagnosis not present

## 2021-07-09 DIAGNOSIS — G8929 Other chronic pain: Secondary | ICD-10-CM | POA: Insufficient documentation

## 2021-07-09 DIAGNOSIS — R2681 Unsteadiness on feet: Secondary | ICD-10-CM | POA: Diagnosis not present

## 2021-07-09 DIAGNOSIS — M25552 Pain in left hip: Secondary | ICD-10-CM | POA: Diagnosis not present

## 2021-07-09 DIAGNOSIS — R269 Unspecified abnormalities of gait and mobility: Secondary | ICD-10-CM | POA: Insufficient documentation

## 2021-07-09 DIAGNOSIS — M6281 Muscle weakness (generalized): Secondary | ICD-10-CM | POA: Insufficient documentation

## 2021-07-09 NOTE — Therapy (Signed)
[facility] ?[place], [place], [address] ?Phone: [phone]   Fax:  [phone] ? ?Physical Therapy Treatment ? ?Patient Details  ?Name: Sheryl Miller ?MRN: [id number] ?Date of Birth: Aug 01, 1935 ?Referring Provider (PT): Dr. Donella Stade ? ? ?Encounter Date: 07/09/2021 ? ? PT End of Session - 07/09/21 1719   ? ? Visit Number 12   ? Number of Visits 25   ? Date for PT Re-Evaluation 08/09/21   ? Authorization Type [employer]   ? Authorization Time Period 05/17/2021- 08/09/2021   ? Progress Note Due on Visit 10   ? PT Start Time 1024   ? PT Stop Time 1100   ? PT Time Calculation (min) 36 min   ? Equipment Utilized During Treatment Gait belt   ? Activity Tolerance Patient tolerated treatment well;No increased pain   ? Behavior During Therapy [facility] for tasks assessed/performed   ? ?  ?  ? ?  ? ? ?Past Medical History:  ?Diagnosis Date  ? Allergic rhinitis, cause unspecified   ? Breast cyst   ? Diverticulosis of colon (without mention of hemorrhage)   ? Esophageal reflux   ? Female stress incontinence   ? Internal hemorrhoids without mention of complication   ? Osteoporosis, unspecified   ? Other and unspecified hyperlipidemia   ? Raynaud's syndrome   ? Rheumatic fever   ? Unspecified constipation   ? ? ?Past Surgical History:  ?Procedure Laterality Date  ? ABDOMINAL HYSTERECTOMY    ? BLADDER REPAIR    ? BREAST BIOPSY Right 2000  ? fibrocystic disease  ? KNEE ARTHROSCOPY Right ~5/16  ? SHOULDER ARTHROSCOPY WITH ROTATOR CUFF REPAIR AND SUBACROMIAL DECOMPRESSION  08/2001  ? Ulysees Barns  ? SHOULDER SURGERY    ?  In 2006, bilaterally shoulder surgery for rotator cuff.  ? TONSILLECTOMY    ? TUBAL LIGATION    ? ? ?There were no vitals filed for this visit. ? ? Subjective Assessment - 07/09/21 1716   ? ? Subjective Pt reports no pain. She reports a couple stumbles with walking but no falls.   ? Pertinent History Patient is a 86 year old female with  reported history of hip weakness left side and recent balance trouble with recent fall and trip later to urgent care. Patient has medical history of leg length discrepancy, Osteoporosis, rheumatic fever.   ? Limitations Walking;Standing;Lifting   ? How long can you sit comfortably? no issues   ? How long can you stand comfortably? I haven't measured but I think maybe 20 min if I had to   ? How long can you walk comfortably? about a mile maybe   ? Patient Stated Goals I want to walk better and not trip, stronger going up steps   ? Currently in Pain? No/denies   ? ?  ?  ? ?  ? ? ?INTERVENTIONS ? ?Hip abduction 1x10 B; rates easy ? Progressed to 2.5 lb weights 2x10 B; pt rates medium ? ?  ?-standing marching 2x20 alt LE @ 2.5lb AW bilat ? ?Standing hip ext with 2.5 lb weights 2x10 BLEs ?   ?Single leg heel raise 1x12 B, 1x8 B ? ?- leg press  ?25# 10x bilat ?40# 10x bilat ?25# individual LE 10x for each  ?  ?-seated FIG 4 stretch 2x30sec bilat ? ?Seated hamstring stretch 2x30 sec BLEs  ? ? ?NMR: ? ?Amb with head turns while reading sticky notes 4x  length of long hallway ?Amb with vertical head turns 2x length of hallway. Challenging for pt  ?Other comments: Pt notes decreased steadiness with both of the above interventions ? ?SLB 45 sec each LE ?  ?Pt educated throughout session about proper posture and technique with exercises. Improved exercise technique, movement at target joints, use of target muscles after min to mod verbal, visual, tactile cues. ? ? ? PT Education - 07/09/21 1718   ? ? Education Details exercise technique, body mechanics   ? Person(s) Educated Patient   ? Methods Explanation;Demonstration;Verbal cues   ? Comprehension Verbalized understanding;Returned demonstration;Need further instruction;Verbal cues required   ? ?  ?  ? ?  ? ? ? PT Short Term Goals - 06/25/21 1046   ? ?  ? PT SHORT TERM GOAL #1  ? Title Pt will be independent with initial HEP in order to improve strength and balance in order to  decrease fall risk and improve function at home and work.   ? Baseline Eval= no formal HEP in place   ? Time 6   ? Period Weeks   ? Status Achieved   ? Target Date 08/09/21   ? ?  ?  ? ?  ? ? ? ? PT Long Term Goals - 06/27/21 1005   ? ?  ? PT LONG TERM GOAL #1  ? Title Pt will be independent with final HEP in order to improve strength and balance in order to decrease fall risk and improve function at home and work.   ? Baseline Eval= no formal HEP in place   ? Time 12   ? Period Weeks   ? Status On-going   ? Target Date 08/09/21   ?  ? PT LONG TERM GOAL #2  ? Title Pt will improve FOTO to target score of 58 to display perceived improvements in ability to complete ADL's   ? Baseline Eval: 53; 06/25/21; 53 at eval   ? Time 12   ? Status On-going   ? Target Date 08/09/21   ?  ? PT LONG TERM GOAL #3  ? Title Pt will improve BERG by at least 3 points in order to demonstrate clinically significant improvement in balance.   ? Baseline Eval: 45/56; 4/20: 55/56   ? Time 12   ? Period Weeks   ? Status New   ? Target Date 08/09/21   ?  ? PT LONG TERM GOAL #4  ? Title Pt will decrease 5TSTS by at least 3 seconds in order to demonstrate clinically significant improvement in LE strength.   ? Baseline Eval: 16.07 sec; 06/25/21: 10.99   ? Time 12   ? Period Weeks   ? Status On-going   ? Target Date 08/09/21   ?  ? PT LONG TERM GOAL #5  ? Title Patient will demonstrate improved Left hip abd strength by performing 10 reps in sidelying against gravity for improved over hip strength with standing/walking.   ? Baseline Eval= Patient unable to complete a full ROM hip abd left LE; 4/20: Pt fatigues after 5 performed with proper cuing   ? Time 12   ? Period Weeks   ? Status On-going   ? Target Date 08/09/21   ? ?  ?  ? ?  ? ? ? ? ? ? ? ? Plan - 07/09/21 1719   ? ? Clinical Impression Statement Session somewhat limited secondary to pt late arrival. However, pt still highly motivated to complete interventions  and participate in PT. Continued  LE strengthening progressions. Standing hip abduction remains primary area to improve. The pt will benefit from further skilled PT to continue to improve strength, balance and mobility.   ? Personal Factors and Comorbidities Age   ? Examination-Activity Limitations Bend;Caring for Others;Carry;Lift;Squat;Stairs;Stand   ? Examination-Participation Restrictions Cleaning;Community Activity;[place] Leaver Work   ? Stability/Clinical Decision Making Evolving/Moderate complexity   ? Rehab Potential Good   ? PT Frequency 2x / week   ? PT Duration 12 weeks   ? PT Treatment/Interventions ADLs/Self Care Home Management;Canalith Repostioning;Cryotherapy;Electrical Stimulation;Moist Heat;Traction;DME Instruction;Gait training;Ultrasound;Stair training;Functional mobility training;Therapeutic activities;Therapeutic exercise;Balance training;Neuromuscular re-education;Patient/family education;Manual techniques;Passive range of motion;Dry needling   ? PT Next Visit Plan Review and progress Hip strengtheing, Implement higher level balance activities- static/dynamic, continue plan   ? PT Home Exercise Plan 05/17/2021=Access Code: [email or web address]  URL: [email or web address]; 06/05/2021=Access Code: [email or web address]  URL: [email or web address]; 4/11: Access Code: [email or web address]; 4/13: [email or web address]   ? Consulted and Agree with Plan of Care Patient   ? ?  ?  ? ?  ? ? ?Patient will benefit from skilled therapeutic intervention in order to improve the following deficits and impairments:  Abnormal gait, Decreased activity tolerance, Decreased balance, Decreased endurance, Decreased mobility, Decreased strength, Difficulty walking, Hypomobility, Impaired flexibility ? ?Visit Diagnosis: ?Difficulty in walking, not elsewhere classified ? ?Muscle weakness (generalized) ? ?Unsteadiness on feet ? ? ? ? ?Problem List ?Patient Active Problem List  ? Diagnosis Date Noted  ? Neuropathy 10/29/2018  ? Generalized osteoarthritis 07/10/2017  ? Advance directive  discussed with patient 02/13/2016  ? Family history of breast cancer 07/20/2015  ? Hyperlipemia 02/12/2015  ? Sleep disturbance 04/20/2014  ? Balance problem 01/26/2013  ? Routine general medical examina

## 2021-07-11 ENCOUNTER — Ambulatory Visit: Payer: Medicare HMO | Admitting: Physical Therapy

## 2021-07-11 ENCOUNTER — Telehealth: Payer: Self-pay | Admitting: Internal Medicine

## 2021-07-11 ENCOUNTER — Ambulatory Visit: Payer: Medicare HMO

## 2021-07-11 DIAGNOSIS — G8929 Other chronic pain: Secondary | ICD-10-CM

## 2021-07-11 DIAGNOSIS — R269 Unspecified abnormalities of gait and mobility: Secondary | ICD-10-CM

## 2021-07-11 DIAGNOSIS — M47896 Other spondylosis, lumbar region: Secondary | ICD-10-CM | POA: Diagnosis not present

## 2021-07-11 DIAGNOSIS — R262 Difficulty in walking, not elsewhere classified: Secondary | ICD-10-CM

## 2021-07-11 DIAGNOSIS — M13852 Other specified arthritis, left hip: Secondary | ICD-10-CM | POA: Diagnosis not present

## 2021-07-11 DIAGNOSIS — M545 Low back pain, unspecified: Secondary | ICD-10-CM | POA: Diagnosis not present

## 2021-07-11 DIAGNOSIS — M47816 Spondylosis without myelopathy or radiculopathy, lumbar region: Secondary | ICD-10-CM | POA: Diagnosis not present

## 2021-07-11 DIAGNOSIS — M6281 Muscle weakness (generalized): Secondary | ICD-10-CM

## 2021-07-11 DIAGNOSIS — R2681 Unsteadiness on feet: Secondary | ICD-10-CM

## 2021-07-11 DIAGNOSIS — M25552 Pain in left hip: Secondary | ICD-10-CM | POA: Diagnosis not present

## 2021-07-11 NOTE — Therapy (Signed)
[place] [facility] ?[place], [place], [address] ?Phone: [phone]   Fax:  [phone] ? ?Patient Details  ?Name: Sheryl Miller ?MRN: [id number] ?Date of Birth: 1935-03-16 ?Referring Provider:  Owens Loffler, MD ? ?Encounter Date: 07/11/2021 ? ?Patient arrived to clinic today complaining of ongoing left hip pain and stating she feels like the exercises may be contributing. She denies any imaging since her initial fall. She was able to point to lateral left hip and states pain is not point specific but diffuse- complaining of aching. No Treatment today- instructed patient to follow up with PCP as she states she believes she has an appt next week. Instructed her if not to request an earlier visit to ask MD if ok to go to ortho urgent care if needed. She verbalized understanding of plan and to contact office once she has a plan with her MD. Will likely hold all PT visits until she has been further evaluate ? ? ?Lewis Moccasin, PT ?07/11/2021, 5:25 PM ? ?[place] ?[facility] ?[place], [place], [address] ?Phone: [phone]   Fax:  [phone] ?

## 2021-07-11 NOTE — Telephone Encounter (Signed)
Spoke to pt. She is not able to come tomorrow so I made her an appt for Monday. I also gave her info on [facility]. PT had also told her about them, also. I gave her the phone number for them so she could verify with insurance as she thought she had to have a referral from Dr Silvio Pate. I told her I did not think she did.  ?

## 2021-07-11 NOTE — Telephone Encounter (Signed)
Pt went to therapy today with [facility] and they stopped it until she can get a xray on her hip, they wont do any more therapy until she has it done.she wants to know if she can have it done. Call back is [phone] ?

## 2021-07-15 ENCOUNTER — Ambulatory Visit: Payer: Medicare HMO

## 2021-07-15 ENCOUNTER — Encounter: Payer: Self-pay | Admitting: Internal Medicine

## 2021-07-15 ENCOUNTER — Ambulatory Visit (INDEPENDENT_AMBULATORY_CARE_PROVIDER_SITE_OTHER): Payer: Medicare HMO | Admitting: Internal Medicine

## 2021-07-15 DIAGNOSIS — M545 Low back pain, unspecified: Secondary | ICD-10-CM | POA: Diagnosis not present

## 2021-07-15 NOTE — Patient Instructions (Signed)
Please increase the tylenol to 1000 mg three times a day. ?Try over the counter diclofenac gel---rubbed on the painful area --- 2-3 times a day. ?Ask to restart the physical therapy. ?If you do not hear about a referral in the next week or so, I will set you up with a physiatrist at [facility] ?

## 2021-07-15 NOTE — Progress Notes (Signed)
? ?Subjective:  ? ? Patient ID: Sheryl Miller, female    DOB: March 09, 1936, 86 y.o.   MRN: [id number] ? ?HPI ?Here with husband due to back and hip pain ? ?"My back is just killing me" ?Has had intermittent pain which would get better---goes back a year or more ?Now the pain is worse--hard to even walk due to pain ?No pain in bed---and not bad when sitting ?Pain in low back on both sides ? ?Did get seen at [facility] ?Had x-rays done--"something in my spine" ?Has been doing PT---for hip (then stopped due to pain in back) ? ?No radiation of pain into legs ?No leg weakness ? ?Got medication--she thinks it was an antiinflammatory ?Sounds like steroid taper pack--but it hasn't helped ?Taking tylenol '1000mg'$  bid---may help slightly ? ?Current Outpatient Medications on File Prior to Visit  ?Medication Sig Dispense Refill  ? brimonidine (ALPHAGAN) 0.2 % ophthalmic solution 1 drop 2 (two) times daily.    ? diclofenac Sodium (VOLTAREN) 1 % GEL diclofenac 1 % topical gel    ? fexofenadine (ALLEGRA) 180 MG tablet Take 180 mg by mouth daily.    ? ketoconazole (NIZORAL) 2 % shampoo MASSAGE INTO SCALP EVERY OTHER DAY, LET SIT SEVERAL MINUTES BEFORE RINSING. 120 mL 3  ? mometasone (ELOCON) 0.1 % lotion APPLY TO AFFECTED AREAS OF SCALP TWICE DAILY UNTIL IMPROVED AND AS NEEDED FOR RECURRENCE. 60 mL 0  ? Multiple Vitamins-Minerals (CENTRUM SILVER ULTRA WOMENS) TABS Take 1 tablet by mouth daily.     ? Probiotic Product (PROBIOTIC PO) Take by mouth.    ? simvastatin (ZOCOR) 40 MG tablet TAKE 1 TABLET AT BEDTIME 90 tablet 3  ? timolol (TIMOPTIC) 0.5 % ophthalmic solution     ? traZODone (DESYREL) 150 MG tablet TAKE 1 TABLET AT BEDTIME AS NEEDED  FOR  SLEEP 90 tablet 3  ? triamcinolone cream (KENALOG) 0.1 % APPLY TO AFFECTED AREAS OF RASH ON BACK 1 TO 2 TIMES DAILY UNTIL IMPROVED. AVOID FACE, GROIN, AXILLA. 80 g 1  ? TURMERIC PO Take by mouth.    ? ?No current facility-administered medications on file prior to visit.  ? ? ?No Known  Allergies ? ?Past Medical History:  ?Diagnosis Date  ? Allergic rhinitis, cause unspecified   ? Breast cyst   ? Diverticulosis of colon (without mention of hemorrhage)   ? Esophageal reflux   ? Female stress incontinence   ? Internal hemorrhoids without mention of complication   ? Osteoporosis, unspecified   ? Other and unspecified hyperlipidemia   ? Raynaud's syndrome   ? Rheumatic fever   ? Unspecified constipation   ? ? ?Past Surgical History:  ?Procedure Laterality Date  ? ABDOMINAL HYSTERECTOMY    ? BLADDER REPAIR    ? BREAST BIOPSY Right 2000  ? fibrocystic disease  ? KNEE ARTHROSCOPY Right ~5/16  ? SHOULDER ARTHROSCOPY WITH ROTATOR CUFF REPAIR AND SUBACROMIAL DECOMPRESSION  08/2001  ? Ulysees Barns  ? SHOULDER SURGERY    ?  In 2006, bilaterally shoulder surgery for rotator cuff.  ? TONSILLECTOMY    ? TUBAL LIGATION    ? ? ?Family History  ?Problem Relation Age of Onset  ? Coronary artery disease Mother   ? Heart disease Mother   ? Diabetes Mother   ? COPD Father   ? Breast cancer Sister   ?     2 (age 77?, age 67?); "Gene pos"  ? Diabetes Brother   ? Breast cancer Maternal Aunt   ?  2  ? Stroke Paternal Grandmother   ? Diabetes Brother   ? Breast cancer Sister   ? Breast cancer Daughter   ?     ? at age 44  ? Breast cancer Maternal Aunt   ? Breast cancer Niece   ? Breast cancer Niece   ? ? ?Social History  ? ?Socioeconomic History  ? Marital status: Married  ?  Spouse name: Not on file  ? Number of children: 4  ? Years of education: Not on file  ? Highest education level: Not on file  ?Occupational History  ? Occupation: retired Press photographer  ?  Employer: RETIRED  ?Tobacco Use  ? Smoking status: Never  ? Smokeless tobacco: Never  ?Vaping Use  ? Vaping Use: Never used  ?Substance and Sexual Activity  ? Alcohol use: No  ?  Alcohol/week: 0.0 standard drinks  ? Drug use: No  ? Sexual activity: Yes  ?  Birth control/protection: Post-menopausal  ?Other Topics Concern  ? Not on file  ?Social History Narrative  ? Regular  exercise-yes, walking or rides stationery bike daily  ?   ? Has living will.   ? Husband should make health care decisions as needed, then son Herbie [place].   ? Would accept resuscitation but no prolonged ventilation.   ? Would not want feeding tube for extended time if not cognitively aware  ?   ?   ?   ? ?Social Determinants of Health  ? ?Financial Resource Strain: Not on file  ?Food Insecurity: Not on file  ?Transportation Needs: Not on file  ?Physical Activity: Not on file  ?Stress: Not on file  ?Social Connections: Not on file  ?Intimate Partner Violence: Not on file  ? ?Review of Systems ?Not sick ?No fever ? ?   ?Objective:  ? Physical Exam ?Constitutional:   ?   Appearance: Normal appearance.  ?Musculoskeletal:  ?   Comments: No spine tenderness ?Pain spot is paraspinal low lumbar---but not tender ?SLR negative ?Normal ROM in hips  ?Neurological:  ?   Mental Status: She is alert.  ?   Comments: Slightly antalgic gait ?No leg weakness  ?  ? ? ? ? ?   ?Assessment & Plan:  ? ?

## 2021-07-15 NOTE — Assessment & Plan Note (Addendum)
Has had evaluation at ortho and told to see a "back doctor" ?Unclear if this is spinal stenosis (not classic) ?Didn't suggest MRI ?They didn't send her back to therapy--which I think is probably the next step ?Increase tylenol to '1000mg'$  tid and add topical diclofenac ? ?Consider referral to physiatry---if they haven't referred her ?

## 2021-07-16 ENCOUNTER — Ambulatory Visit: Payer: Medicare HMO

## 2021-07-17 ENCOUNTER — Ambulatory Visit: Payer: Medicare HMO

## 2021-07-17 ENCOUNTER — Telehealth: Payer: Self-pay | Admitting: Internal Medicine

## 2021-07-17 MED ORDER — TIZANIDINE HCL 2 MG PO TABS: 2.0000 mg | ORAL_TABLET | Freq: Two times a day (BID) | ORAL | 0 refills | Status: DC | PRN

## 2021-07-17 NOTE — Telephone Encounter (Signed)
Spoke to pt

## 2021-07-17 NOTE — Telephone Encounter (Signed)
Please let her know that I sent a muscle relaxer for her to try. ?I mostly recommend it for bedtime--but she can try it during the day as long as she is not going anywhere (and be careful for sedation, etc) ?

## 2021-07-17 NOTE — Telephone Encounter (Signed)
Pt is taking methayiprednisolone Prescribed by Dr Gerrit Halls with [facility] and she said she has 1 left for tonight. She said he said she cant take anything but aspirin with it but she was wondering if Dr Silvio Pate would prescribe a muscle relaxer for her. Callback is [phone]. Pharmacy is [facility] ?

## 2021-07-18 ENCOUNTER — Ambulatory Visit: Payer: Medicare HMO

## 2021-07-22 ENCOUNTER — Ambulatory Visit: Payer: Medicare HMO

## 2021-07-22 DIAGNOSIS — R269 Unspecified abnormalities of gait and mobility: Secondary | ICD-10-CM | POA: Diagnosis not present

## 2021-07-22 DIAGNOSIS — M6281 Muscle weakness (generalized): Secondary | ICD-10-CM

## 2021-07-22 DIAGNOSIS — R262 Difficulty in walking, not elsewhere classified: Secondary | ICD-10-CM | POA: Diagnosis not present

## 2021-07-22 DIAGNOSIS — R2681 Unsteadiness on feet: Secondary | ICD-10-CM | POA: Diagnosis not present

## 2021-07-22 DIAGNOSIS — G8929 Other chronic pain: Secondary | ICD-10-CM | POA: Diagnosis not present

## 2021-07-22 DIAGNOSIS — M25552 Pain in left hip: Secondary | ICD-10-CM | POA: Diagnosis not present

## 2021-07-22 NOTE — Therapy (Signed)
[facility] ?[place], [place], [address] ?Phone: [phone]   Fax:  [phone] ? ?Physical Therapy Treatment ? ?Patient Details  ?Name: Sheryl Miller ?MRN: [id number] ?Date of Birth: May 27, 1935 ?Referring Provider (PT): Dr. Donella Stade ? ? ?Encounter Date: 07/22/2021 ? ? PT End of Session - 07/22/21 1134   ? ? Visit Number 13   ? Number of Visits 25   ? Date for PT Re-Evaluation 08/09/21   ? Authorization Type [employer]   ? Authorization Time Period 05/17/2021- 08/09/2021   ? Progress Note Due on Visit 20   ? PT Start Time [phone]   ? PT Stop Time 1014   ? PT Time Calculation (min) 36 min   ? Equipment Utilized During Treatment Gait belt   ? Activity Tolerance Patient tolerated treatment well;No increased pain   ? Behavior During Therapy [employer] for tasks assessed/performed   ? ?  ?  ? ?  ? ? ?Past Medical History:  ?Diagnosis Date  ? Allergic rhinitis, cause unspecified   ? Breast cyst   ? Diverticulosis of colon (without mention of hemorrhage)   ? Esophageal reflux   ? Female stress incontinence   ? Internal hemorrhoids without mention of complication   ? Osteoporosis, unspecified   ? Other and unspecified hyperlipidemia   ? Raynaud's syndrome   ? Rheumatic fever   ? Unspecified constipation   ? ? ?Past Surgical History:  ?Procedure Laterality Date  ? ABDOMINAL HYSTERECTOMY    ? BLADDER REPAIR    ? BREAST BIOPSY Right 2000  ? fibrocystic disease  ? KNEE ARTHROSCOPY Right ~5/16  ? SHOULDER ARTHROSCOPY WITH ROTATOR CUFF REPAIR AND SUBACROMIAL DECOMPRESSION  08/2001  ? Ulysees Barns  ? SHOULDER SURGERY    ?  In 2006, bilaterally shoulder surgery for rotator cuff.  ? TONSILLECTOMY    ? TUBAL LIGATION    ? ? ?There were no vitals filed for this visit. ? ? Subjective Assessment - 07/22/21 0954   ? ? Subjective Patient reports she went to MD regarding her back- States she was put on muscle relaxer..   ? Pertinent History Patient is a 86 year old  female with reported history of hip weakness left side and recent balance trouble with recent fall and trip later to urgent care. Patient has medical history of leg length discrepancy, Osteoporosis, rheumatic fever.   ? Limitations Walking;Standing;Lifting   ? How long can you sit comfortably? no issues   ? How long can you stand comfortably? I haven't measured but I think maybe 20 min if I had to   ? How long can you walk comfortably? about a mile maybe   ? Patient Stated Goals I want to walk better and not trip, stronger going up steps   ? Currently in Pain? Yes   ? Pain Score 3    ? Pain Location Back   ? Pain Orientation Left;Posterior;Lower   ? Pain Descriptors / Indicators Aching;Sore   ? Pain Type Chronic pain   ? Pain Onset More than a month ago   ? Pain Frequency Intermittent   ? Aggravating Factors  Walking   ? Pain Relieving Factors Rest, Medication   ? Effect of Pain on Daily Activities Difficulty with walking   ? ?  ?  ? ?  ? ? ?INTERVENTIONS:  ? ? ? ?Patient arrived a few min late for appointment due to traffic per her report.  ? ?  Treatment: Moist heat to low back while performing manual stretching today. ? ?Manual Therapy: ? ?Single knee to chest ?Lower trunk rotation ?B Hamstring ?Hip circles- CW/CCW ?Sciatic nerve glide- SLR with ankle DF x 20 reps ?Pirifomis ? ?Hold 30 sec  x 4 each LE. ? ? ? ? ? ? ? ? ? ? ? ? ? ? ? ? ? ? ? ? ? ? ? ? ? ? PT Education - 07/22/21 1134   ? ? Education Details Stretching exercise technique   ? Person(s) Educated Patient   ? Methods Explanation;Demonstration;Tactile cues;Verbal cues   ? Comprehension Verbalized understanding;Returned demonstration;Verbal cues required;Tactile cues required;Need further instruction   ? ?  ?  ? ?  ? ? ? PT Short Term Goals - 06/25/21 1046   ? ?  ? PT SHORT TERM GOAL #1  ? Title Pt will be independent with initial HEP in order to improve strength and balance in order to decrease fall risk and improve function at home and work.   ? Baseline  Eval= no formal HEP in place   ? Time 6   ? Period Weeks   ? Status Achieved   ? Target Date 08/09/21   ? ?  ?  ? ?  ? ? ? ? PT Long Term Goals - 06/27/21 1005   ? ?  ? PT LONG TERM GOAL #1  ? Title Pt will be independent with final HEP in order to improve strength and balance in order to decrease fall risk and improve function at home and work.   ? Baseline Eval= no formal HEP in place   ? Time 12   ? Period Weeks   ? Status On-going   ? Target Date 08/09/21   ?  ? PT LONG TERM GOAL #2  ? Title Pt will improve FOTO to target score of 58 to display perceived improvements in ability to complete ADL's   ? Baseline Eval: 53; 06/25/21; 53 at eval   ? Time 12   ? Status On-going   ? Target Date 08/09/21   ?  ? PT LONG TERM GOAL #3  ? Title Pt will improve BERG by at least 3 points in order to demonstrate clinically significant improvement in balance.   ? Baseline Eval: 45/56; 4/20: 55/56   ? Time 12   ? Period Weeks   ? Status New   ? Target Date 08/09/21   ?  ? PT LONG TERM GOAL #4  ? Title Pt will decrease 5TSTS by at least 3 seconds in order to demonstrate clinically significant improvement in LE strength.   ? Baseline Eval: 16.07 sec; 06/25/21: 10.99   ? Time 12   ? Period Weeks   ? Status On-going   ? Target Date 08/09/21   ?  ? PT LONG TERM GOAL #5  ? Title Patient will demonstrate improved Left hip abd strength by performing 10 reps in sidelying against gravity for improved over hip strength with standing/walking.   ? Baseline Eval= Patient unable to complete a full ROM hip abd left LE; 4/20: Pt fatigues after 5 performed with proper cuing   ? Time 12   ? Period Weeks   ? Status On-going   ? Target Date 08/09/21   ? ?  ?  ? ?  ? ? ? ? ? ? ? ? Plan - 07/22/21 1136   ? ? Clinical Impression Statement Patient reported pain improved from 3/10 down to 1/10 after stretching  and heat. Patient responded well to all manual techniques with improved overall pain and good ability to relax. Will try to review next session and  resume strengthening exercises as able next visit. The pt will benefit from further skilled PT to continue to improve strength, balance and mobility.   ? Personal Factors and Comorbidities Age   ? Examination-Activity Limitations Bend;Caring for Others;Carry;Lift;Squat;Stairs;Stand   ? Examination-Participation Restrictions Cleaning;Community Activity;[employer] Work   ? Stability/Clinical Decision Making Evolving/Moderate complexity   ? Rehab Potential Good   ? PT Frequency 2x / week   ? PT Duration 12 weeks   ? PT Treatment/Interventions ADLs/Self Care Home Management;Canalith Repostioning;Cryotherapy;Electrical Stimulation;Moist Heat;Traction;DME Instruction;Gait training;Ultrasound;Stair training;Functional mobility training;Therapeutic activities;Therapeutic exercise;Balance training;Neuromuscular re-education;Patient/family education;Manual techniques;Passive range of motion;Dry needling   ? PT Next Visit Plan Patient to be on hold until either sees her primary MD or possibly seen at [facility] urgent care for ongoing left hip pain. Patient to contact clinic once seen to discuss plan.   ? PT Home Exercise Plan 05/17/2021=Access Code: [email or web address]  URL: [email or web address]; 06/05/2021=Access Code: [email or web address]  URL: [email or web address]; 4/11: Access Code: [email or web address]; 4/13: [email or web address]   ? Consulted and Agree with Plan of Care Patient   ? ?  ?  ? ?  ? ? ?Patient will benefit from skilled therapeutic intervention in order to improve the following deficits and impairments:  Abnormal gait, Decreased activity tolerance, Decreased balance, Decreased endurance, Decreased mobility, Decreased strength, Difficulty walking, Hypomobility, Impaired flexibility ? ?Visit Diagnosis: ?Difficulty in walking, not elsewhere classified ? ?Muscle weakness (generalized) ? ?Unsteadiness on feet ? ?Abnormality of gait and mobility ? ?Chronic left hip pain ? ? ? ? ?Problem List ?Patient Active Problem List  ? Diagnosis Date  Noted  ? Lumbar back pain 07/15/2021  ? Neuropathy 10/29/2018  ? Generalized osteoarthritis 07/10/2017  ? Advance directive discussed with patient 02/13/2016  ? Family history of breast cancer 05/12/2

## 2021-07-23 ENCOUNTER — Ambulatory Visit: Payer: Medicare HMO

## 2021-07-23 ENCOUNTER — Other Ambulatory Visit: Payer: Self-pay | Admitting: Dermatology

## 2021-07-23 DIAGNOSIS — L219 Seborrheic dermatitis, unspecified: Secondary | ICD-10-CM

## 2021-07-25 ENCOUNTER — Ambulatory Visit: Payer: Medicare HMO

## 2021-07-25 DIAGNOSIS — R2681 Unsteadiness on feet: Secondary | ICD-10-CM | POA: Diagnosis not present

## 2021-07-25 DIAGNOSIS — R269 Unspecified abnormalities of gait and mobility: Secondary | ICD-10-CM

## 2021-07-25 DIAGNOSIS — G8929 Other chronic pain: Secondary | ICD-10-CM | POA: Diagnosis not present

## 2021-07-25 DIAGNOSIS — R262 Difficulty in walking, not elsewhere classified: Secondary | ICD-10-CM | POA: Diagnosis not present

## 2021-07-25 DIAGNOSIS — M6281 Muscle weakness (generalized): Secondary | ICD-10-CM

## 2021-07-25 DIAGNOSIS — M25552 Pain in left hip: Secondary | ICD-10-CM | POA: Diagnosis not present

## 2021-07-25 NOTE — Therapy (Signed)
[place] [facility] [address] [place], [place], [address] Phone: [phone]   Fax:  [phone]  Physical Therapy Treatment  Patient Details  Name: Sheryl Miller MRN: [id number] Date of Birth: 01/18/1936 Referring Provider (PT): Dr. Donella Stade   Encounter Date: 07/25/2021   PT End of Session - 07/25/21 1108     Visit Number 14    Number of Visits 25    Date for PT Re-Evaluation 08/09/21    Authorization Type [employer]    Authorization Time Period 05/17/2021- 08/09/2021    Progress Note Due on Visit 20    PT Start Time 1101    PT Stop Time 1140    PT Time Calculation (min) 39 min    Equipment Utilized During Treatment Gait belt    Activity Tolerance Patient tolerated treatment well;No increased pain    Behavior During Therapy WFL for tasks assessed/performed             Past Medical History:  Diagnosis Date   Allergic rhinitis, cause unspecified    Breast cyst    Diverticulosis of colon (without mention of hemorrhage)    Esophageal reflux    Female stress incontinence    Internal hemorrhoids without mention of complication    Osteoporosis, unspecified    Other and unspecified hyperlipidemia    Raynaud's syndrome    Rheumatic fever    Unspecified constipation     Past Surgical History:  Procedure Laterality Date   ABDOMINAL HYSTERECTOMY     BLADDER REPAIR     BREAST BIOPSY Right 2000   fibrocystic disease   KNEE ARTHROSCOPY Right ~5/16   SHOULDER ARTHROSCOPY WITH ROTATOR CUFF REPAIR AND SUBACROMIAL DECOMPRESSION  08/2001   R, Scott Dean   SHOULDER SURGERY      In 2006, bilaterally shoulder surgery for rotator cuff.   TONSILLECTOMY     TUBAL LIGATION      There were no vitals filed for this visit.   Subjective Assessment - 07/25/21 1106     Subjective Patient reports feeling some better and did not take a mm relaxer today.    Pertinent History Patient is a 86 year old female with  reported history of hip weakness left side and recent balance trouble with recent fall and trip later to urgent care. Patient has medical history of leg length discrepancy, Osteoporosis, rheumatic fever.    Limitations Walking;Standing;Lifting    How long can you sit comfortably? no issues    How long can you stand comfortably? I haven't measured but I think maybe 20 min if I had to    How long can you walk comfortably? about a mile maybe    Patient Stated Goals I want to walk better and not trip, stronger going up steps    Currently in Pain? Yes    Pain Score 2     Pain Location Calf    Pain Orientation Right;Posterior    Pain Descriptors / Indicators Aching    Pain Type Acute pain    Pain Onset Yesterday    Pain Frequency Intermittent    Aggravating Factors  Walking    Pain Relieving Factors Rest, medication    Effect of Pain on Daily Activities DIfficulty walking                INTERVENTIONS:     Manual Therapy: Hold 30 sec  x 4 each LE.     Single knee to chest Lower trunk  rotation B Hamstring Hip circles- CW/CCW Sciatic nerve glide- SLR with ankle DF x 20 reps Pirifomis  Therapeutic Exercises:        Calf stretch in standing - at wall - hold 20 sec x 3  Calf stretch in standing using steps- Hold 20 sec x 3  Added also supine left hip flex stretch; Supine piriformis stretch, Sciatic nerve flossing.     Access Code: [email or web address] URL: [email or web address] Date: 07/25/2021 Prepared by: Sande Brothers  Exercises - Modified Thomas Stretch  - 1 x daily - 7 x weekly - 3 sets - 20-30 hold - Supine Piriformis Stretch Pulling Heel to Hip  - 1 x daily - 3 sets - 20-30 hold - Supine Figure 4 Piriformis Stretch  - 1 x daily - 3 sets - 20-30 hold - Straight Leg Raise Sciatic Nerve Flossing  - 1 x daily - 3 sets - 20-30 hold                 PT Education - 07/25/21 1107     Education Details Exercise technique    Person(s) Educated  Patient    Methods Explanation;Demonstration;Tactile cues;Verbal cues    Comprehension Verbalized understanding;Returned demonstration;Verbal cues required;Tactile cues required;Need further instruction              PT Short Term Goals - 06/25/21 1046       PT SHORT TERM GOAL #1   Title Pt will be independent with initial HEP in order to improve strength and balance in order to decrease fall risk and improve function at home and work.    Baseline Eval= no formal HEP in place    Time 6    Period Weeks    Status Achieved    Target Date 08/09/21               PT Long Term Goals - 06/27/21 1005       PT LONG TERM GOAL #1   Title Pt will be independent with final HEP in order to improve strength and balance in order to decrease fall risk and improve function at home and work.    Baseline Eval= no formal HEP in place    Time 12    Period Weeks    Status On-going    Target Date 08/09/21      PT LONG TERM GOAL #2   Title Pt will improve FOTO to target score of 58 to display perceived improvements in ability to complete ADL's    Baseline Eval: 53; 06/25/21; 53 at eval    Time 12    Status On-going    Target Date 08/09/21      PT LONG TERM GOAL #3   Title Pt will improve BERG by at least 3 points in order to demonstrate clinically significant improvement in balance.    Baseline Eval: 45/56; 4/20: 55/56    Time 12    Period Weeks    Status New    Target Date 08/09/21      PT LONG TERM GOAL #4   Title Pt will decrease 5TSTS by at least 3 seconds in order to demonstrate clinically significant improvement in LE strength.    Baseline Eval: 16.07 sec; 06/25/21: 10.99    Time 12    Period Weeks    Status On-going    Target Date 08/09/21      PT LONG TERM GOAL #5   Title Patient will demonstrate improved Left hip abd strength by performing 10  reps in sidelying against gravity for improved over hip strength with standing/walking.    Baseline Eval= Patient unable to complete  a full ROM hip abd left LE; 4/20: Pt fatigues after 5 performed with proper cuing    Time 12    Period Weeks    Status On-going    Target Date 08/09/21                   Plan - 07/25/21 2210     Clinical Impression Statement Patient denied any pain with all manual stretching and able to return demonstration of HEP well today. She presented with good left hip flexibility today. as able next visit. The pt will benefit from further skilled PT to continue to improve strength, balance and mobility    Personal Factors and Comorbidities Age    Examination-Activity Limitations Bend;Caring for Others;Carry;Lift;Squat;Stairs;Stand    Examination-Participation Restrictions Cleaning;Community Activity;Yard Work    [employer] Evolving/Moderate complexity    Rehab Potential Good    PT Frequency 2x / week    PT Duration 12 weeks    PT Treatment/Interventions ADLs/Self Care Home Management;Canalith Repostioning;Cryotherapy;Electrical Stimulation;Moist Heat;Traction;DME Instruction;Gait training;Ultrasound;Stair training;Functional mobility training;Therapeutic activities;Therapeutic exercise;Balance training;Neuromuscular re-education;Patient/family education;Manual techniques;Passive range of motion;Dry needling    PT Next Visit Plan Resume hip strengthening next session if pain levels remain manageable.    PT Home Exercise Plan 05/17/2021=Access Code: [email or web address]  URL: [email or web address]; 06/05/2021=Access Code: [email or web address]  URL: [email or web address]; 4/11: Access Code: [email or web address]; 4/13: [email or web address]; 07/25/2021=?Access Code: [email or web address]  URL: [email or web address]    Consulted and Agree with Plan of Care Patient             Patient will benefit from skilled therapeutic intervention in order to improve the following deficits and impairments:  Abnormal gait, Decreased activity tolerance, Decreased balance, Decreased endurance, Decreased  mobility, Decreased strength, Difficulty walking, Hypomobility, Impaired flexibility  Visit Diagnosis: Difficulty in walking, not elsewhere classified  Muscle weakness (generalized)  Unsteadiness on feet  Abnormality of gait and mobility  Chronic left hip pain     Problem List Patient Active Problem List   Diagnosis Date Noted   Lumbar back pain 07/15/2021   Neuropathy 10/29/2018   Generalized osteoarthritis 07/10/2017   Advance directive discussed with patient 02/13/2016   Family history of breast cancer 07/20/2015   Hyperlipemia 02/12/2015   Sleep disturbance 04/20/2014   Balance problem 01/26/2013   Routine general medical examination at a health care facility 08/16/2010   PES PLANUS 02/11/2010   UNEQUAL LEG LENGTH 02/11/2010   VARICOSE VEINS, LOWER EXTREMITIES 12/01/2007   Osteoporosis 06/24/2007   HEMORRHOIDS, INTERNAL 06/07/2007   Chronic constipation 06/07/2007   GERD 02/11/2007   Allergic rhinitis due to pollen 07/09/2006    Lewis Moccasin, PT 07/25/2021, 10:24 PM  [place] [facility] [place], [place], [address] Phone: [phone]   Fax:  [phone]  Name: HIRA TRENT MRN: [id number] Date of Birth: 1935/08/29

## 2021-07-29 ENCOUNTER — Ambulatory Visit: Payer: Medicare HMO

## 2021-07-29 DIAGNOSIS — R269 Unspecified abnormalities of gait and mobility: Secondary | ICD-10-CM

## 2021-07-29 DIAGNOSIS — M25552 Pain in left hip: Secondary | ICD-10-CM | POA: Diagnosis not present

## 2021-07-29 DIAGNOSIS — M6281 Muscle weakness (generalized): Secondary | ICD-10-CM | POA: Diagnosis not present

## 2021-07-29 DIAGNOSIS — R262 Difficulty in walking, not elsewhere classified: Secondary | ICD-10-CM

## 2021-07-29 DIAGNOSIS — G8929 Other chronic pain: Secondary | ICD-10-CM

## 2021-07-29 DIAGNOSIS — R2681 Unsteadiness on feet: Secondary | ICD-10-CM

## 2021-07-29 NOTE — Therapy (Signed)
[place] MAIN [facility] SERVICES [address] [place], [place], [address] Phone: [phone]   Fax:  [phone]  Physical Therapy Treatment  Patient Details  Name: Sheryl Miller MRN: [id number] Date of Birth: Mar 13, 1935 Referring Provider (PT): Dr. Donella Stade   Encounter Date: 07/29/2021   PT End of Session - 07/29/21 0941     Visit Number 15    Number of Visits 25    Date for PT Re-Evaluation 08/09/21    Authorization Type [employer]    Authorization Time Period 05/17/2021- 08/09/2021    Progress Note Due on Visit 20    PT Start Time 0935    PT Stop Time 1015    PT Time Calculation (min) 40 min    Equipment Utilized During Treatment Gait belt    Activity Tolerance Patient tolerated treatment well;No increased pain    Behavior During Therapy WFL for tasks assessed/performed             Past Medical History:  Diagnosis Date   Allergic rhinitis, cause unspecified    Breast cyst    Diverticulosis of colon (without mention of hemorrhage)    Esophageal reflux    Female stress incontinence    Internal hemorrhoids without mention of complication    Osteoporosis, unspecified    Other and unspecified hyperlipidemia    Raynaud's syndrome    Rheumatic fever    Unspecified constipation     Past Surgical History:  Procedure Laterality Date   ABDOMINAL HYSTERECTOMY     BLADDER REPAIR     BREAST BIOPSY Right 2000   fibrocystic disease   KNEE ARTHROSCOPY Right ~5/16   SHOULDER ARTHROSCOPY WITH ROTATOR CUFF REPAIR AND SUBACROMIAL DECOMPRESSION  08/2001   R, Scott Dean   SHOULDER SURGERY      In 2006, bilaterally shoulder surgery for rotator cuff.   TONSILLECTOMY     TUBAL LIGATION      There were no vitals filed for this visit.   Subjective Assessment - 07/29/21 0939     Subjective Patient reports feeling some better and did not take a mm relaxer today.    Pertinent History Patient is a 86 year old female with  reported history of hip weakness left side and recent balance trouble with recent fall and trip later to urgent care. Patient has medical history of leg length discrepancy, Osteoporosis, rheumatic fever.    Limitations Walking;Standing;Lifting    How long can you sit comfortably? no issues    How long can you stand comfortably? I haven't measured but I think maybe 20 min if I had to    How long can you walk comfortably? about a mile maybe    Patient Stated Goals I want to walk better and not trip, stronger going up steps    Currently in Pain? No/denies               Manual Therapy: Hold 30 sec  x 4 each LE.     Single knee to chest Lower trunk rotation B Hamstring Hip circles- CW/CCW Sciatic nerve glide- SLR with ankle DF x 20 reps Pirifomis   Therapeutic Exercises:      Seated hip add with ball squeeze and Abdominal bracing x 10 reps  Supine Bridging x 12  Supine Hip abd (no resistance) in hooklye- x 12 reps each - No report of pain.  Sidelye clamshell no resistance x 10 BLE (no pain- patient reports easier on right LE)  Access Code: [email or web address] URL: [email or web address] Date: 07/29/2021 Prepared by: Sande Brothers  Exercises - Supine Lower Trunk Rotation  - 1 x daily - 3 sets - 20-30 s hold - Supine Hamstring 90/90 Stretch with Caregiver  - 1 x daily - 3 sets - 20-30 s hold - Supine Piriformis Stretch with Leg Straight  - 1-2 x daily - 3 sets - 20-30s hold - Supine Figure 4 Piriformis Stretch  - 1-2 x daily - 3 sets - 20-30s hold - Supine Single Knee to Chest Stretch  - 1-2 x daily - 3 sets - 20-30 s hold                    PT Education - 07/29/21 0940     Education Details Exercise technique    Person(s) Educated Patient;Spouse    Methods Explanation;Demonstration;Verbal cues;Tactile cues    Comprehension Verbalized understanding;Returned demonstration;Verbal cues required;Tactile cues required;Need further instruction               PT Short Term Goals - 06/25/21 1046       PT SHORT TERM GOAL #1   Title Pt will be independent with initial HEP in order to improve strength and balance in order to decrease fall risk and improve function at home and work.    Baseline Eval= no formal HEP in place    Time 6    Period Weeks    Status Achieved    Target Date 08/09/21               PT Long Term Goals - 06/27/21 1005       PT LONG TERM GOAL #1   Title Pt will be independent with final HEP in order to improve strength and balance in order to decrease fall risk and improve function at home and work.    Baseline Eval= no formal HEP in place    Time 12    Period Weeks    Status On-going    Target Date 08/09/21      PT LONG TERM GOAL #2   Title Pt will improve FOTO to target score of 58 to display perceived improvements in ability to complete ADL's    Baseline Eval: 53; 06/25/21; 53 at eval    Time 12    Status On-going    Target Date 08/09/21      PT LONG TERM GOAL #3   Title Pt will improve BERG by at least 3 points in order to demonstrate clinically significant improvement in balance.    Baseline Eval: 45/56; 4/20: 55/56    Time 12    Period Weeks    Status New    Target Date 08/09/21      PT LONG TERM GOAL #4   Title Pt will decrease 5TSTS by at least 3 seconds in order to demonstrate clinically significant improvement in LE strength.    Baseline Eval: 16.07 sec; 06/25/21: 10.99    Time 12    Period Weeks    Status On-going    Target Date 08/09/21      PT LONG TERM GOAL #5   Title Patient will demonstrate improved Left hip abd strength by performing 10 reps in sidelying against gravity for improved over hip strength with standing/walking.    Baseline Eval= Patient unable to complete a full ROM hip abd left LE; 4/20: Pt fatigues after 5 performed with proper cuing    Time 12    Period Weeks  Status On-going    Target Date 08/09/21                   Plan - 07/29/21  1256     Clinical Impression Statement Patient presented with good motivation and participated well today. She denied any Left pain before or during session and was able to resume some strengthening today without difficulty. Only performed several non-weight bearing and no resistance without any report of pain. Will attempt to advance Hip strengthening as appropriate next visit. The pt will benefit from further skilled PT to continue to improve strength, balance and mobility    Personal Factors and Comorbidities Age    Examination-Activity Limitations Bend;Caring for Others;Carry;Lift;Squat;Stairs;Stand    Examination-Participation Restrictions Cleaning;Community Activity;Yard Work    [employer] officer Evolving/Moderate complexity    Rehab Potential Good    PT Frequency 2x / week    PT Duration 12 weeks    PT Treatment/Interventions ADLs/Self Care Home Management;Canalith Repostioning;Cryotherapy;Electrical Stimulation;Moist Heat;Traction;DME Instruction;Gait training;Ultrasound;Stair training;Functional mobility training;Therapeutic activities;Therapeutic exercise;Balance training;Neuromuscular re-education;Patient/family education;Manual techniques;Passive range of motion;Dry needling    PT Next Visit Plan Resume hip strengthening next session if pain levels remain manageable.    PT Home Exercise Plan 05/17/2021=Access Code: [email or web address]  URL: [email or web address]; 06/05/2021=Access Code: [email or web address]  URL: [email or web address]; 4/11: Access Code: [email or web address]; 4/13: [email or web address]; 07/25/2021=?Access Code: [email or web address]  URL: [email or web address]    Consulted and Agree with Plan of Care Patient             Patient will benefit from skilled therapeutic intervention in order to improve the following deficits and impairments:  Abnormal gait, Decreased activity tolerance, Decreased balance, Decreased endurance, Decreased mobility, Decreased strength, Difficulty  walking, Hypomobility, Impaired flexibility  Visit Diagnosis: Difficulty in walking, not elsewhere classified  Muscle weakness (generalized)  Unsteadiness on feet  Abnormality of gait and mobility  Chronic left hip pain     Problem List Patient Active Problem List   Diagnosis Date Noted   Lumbar back pain 07/15/2021   Neuropathy 10/29/2018   Generalized osteoarthritis 07/10/2017   Advance directive discussed with patient 02/13/2016   Family history of breast cancer 07/20/2015   Hyperlipemia 02/12/2015   Sleep disturbance 04/20/2014   Balance problem 01/26/2013   Routine general medical examination at a health care facility 08/16/2010   PES PLANUS 02/11/2010   UNEQUAL LEG LENGTH 02/11/2010   VARICOSE VEINS, LOWER EXTREMITIES 12/01/2007   Osteoporosis 06/24/2007   HEMORRHOIDS, INTERNAL 06/07/2007   Chronic constipation 06/07/2007   GERD 02/11/2007   Allergic rhinitis due to pollen 07/09/2006    Lewis Moccasin, PT 07/29/2021, 1:00 PM  [place] MAIN [facility] SERVICES [address] [place], [place], [address] Phone: [phone]   Fax:  [phone]  Name: BRYONY KAMAN MRN: [id number] Date of Birth: 1935/09/27

## 2021-07-30 ENCOUNTER — Ambulatory Visit: Payer: Medicare HMO

## 2021-07-31 ENCOUNTER — Ambulatory Visit: Payer: Medicare HMO

## 2021-07-31 DIAGNOSIS — M6281 Muscle weakness (generalized): Secondary | ICD-10-CM

## 2021-07-31 DIAGNOSIS — M25552 Pain in left hip: Secondary | ICD-10-CM | POA: Diagnosis not present

## 2021-07-31 DIAGNOSIS — R262 Difficulty in walking, not elsewhere classified: Secondary | ICD-10-CM | POA: Diagnosis not present

## 2021-07-31 DIAGNOSIS — G8929 Other chronic pain: Secondary | ICD-10-CM

## 2021-07-31 DIAGNOSIS — R269 Unspecified abnormalities of gait and mobility: Secondary | ICD-10-CM

## 2021-07-31 DIAGNOSIS — R2681 Unsteadiness on feet: Secondary | ICD-10-CM | POA: Diagnosis not present

## 2021-07-31 NOTE — Therapy (Signed)
[place] MAIN [facility] SERVICES [address] [place], [place], [address] Phone: [phone]   Fax:  [phone]  Physical Therapy Treatment  Patient Details  Name: Sheryl Miller MRN: [id number] Date of Birth: 05-05-1935 Referring Provider (PT): Dr. Donella Stade   Encounter Date: 07/31/2021   PT End of Session - 07/31/21 1018     Visit Number 16    Number of Visits 25    Date for PT Re-Evaluation 08/09/21    Authorization Type [employer]    Authorization Time Period 05/17/2021- 08/09/2021    Progress Note Due on Visit 20    PT Start Time 0935    PT Stop Time 1014    PT Time Calculation (min) 39 min    Equipment Utilized During Treatment Gait belt    Activity Tolerance Patient tolerated treatment well;No increased pain    Behavior During Therapy WFL for tasks assessed/performed             Past Medical History:  Diagnosis Date   Allergic rhinitis, cause unspecified    Breast cyst    Diverticulosis of colon (without mention of hemorrhage)    Esophageal reflux    Female stress incontinence    Internal hemorrhoids without mention of complication    Osteoporosis, unspecified    Other and unspecified hyperlipidemia    Raynaud's syndrome    Rheumatic fever    Unspecified constipation     Past Surgical History:  Procedure Laterality Date   ABDOMINAL HYSTERECTOMY     BLADDER REPAIR     BREAST BIOPSY Right 2000   fibrocystic disease   KNEE ARTHROSCOPY Right ~5/16   SHOULDER ARTHROSCOPY WITH ROTATOR CUFF REPAIR AND SUBACROMIAL DECOMPRESSION  08/2001   R, Scott Dean   SHOULDER SURGERY      In 2006, bilaterally shoulder surgery for rotator cuff.   TONSILLECTOMY     TUBAL LIGATION      There were no vitals filed for this visit.   Subjective Assessment - 07/31/21 0940     Subjective Patient reports she was having some medial knee pain last couple of days but states doing good today.    Pertinent History Patient is a  86 year old female with reported history of hip weakness left side and recent balance trouble with recent fall and trip later to urgent care. Patient has medical history of leg length discrepancy, Osteoporosis, rheumatic fever.    Limitations Walking;Standing;Lifting    How long can you sit comfortably? no issues    How long can you stand comfortably? I haven't measured but I think maybe 20 min if I had to    How long can you walk comfortably? about a mile maybe    Patient Stated Goals I want to walk better and not trip, stronger going up steps    Currently in Pain? No/denies               INTERVENTIONS:   Therapeutic Exercises:  Standing at support bar: with CGA and Gait belt x 10 reps alt LE's  -Standing hip march  -Standing Hip ABD -Standing hip Ext -Standing hip flex with ER  -Standing donkey kick (Patient reported no pain in left hip)   -Seated Hip add with ball squeeze x 5 sec hold x 10 BLE - Seated hip abd with Green theraband x 10 BLE -Seated hamstring curl with GTB x 10 BLE -Sit to stand without UE support x 10 reps   Education provided  throughout session via VC/TC and demonstration to facilitate movement at target joints and correct muscle activation for all testing and exercises performed.                          PT Short Term Goals - 06/25/21 1046       PT SHORT TERM GOAL #1   Title Pt will be independent with initial HEP in order to improve strength and balance in order to decrease fall risk and improve function at home and work.    Baseline Eval= no formal HEP in place    Time 6    Period Weeks    Status Achieved    Target Date 08/09/21               PT Long Term Goals - 06/27/21 1005       PT LONG TERM GOAL #1   Title Pt will be independent with final HEP in order to improve strength and balance in order to decrease fall risk and improve function at home and work.    Baseline Eval= no formal HEP in place    Time 12     Period Weeks    Status On-going    Target Date 08/09/21      PT LONG TERM GOAL #2   Title Pt will improve FOTO to target score of 58 to display perceived improvements in ability to complete ADL's    Baseline Eval: 53; 06/25/21; 53 at eval    Time 12    Status On-going    Target Date 08/09/21      PT LONG TERM GOAL #3   Title Pt will improve BERG by at least 3 points in order to demonstrate clinically significant improvement in balance.    Baseline Eval: 45/56; 4/20: 55/56    Time 12    Period Weeks    Status New    Target Date 08/09/21      PT LONG TERM GOAL #4   Title Pt will decrease 5TSTS by at least 3 seconds in order to demonstrate clinically significant improvement in LE strength.    Baseline Eval: 16.07 sec; 06/25/21: 10.99    Time 12    Period Weeks    Status On-going    Target Date 08/09/21      PT LONG TERM GOAL #5   Title Patient will demonstrate improved Left hip abd strength by performing 10 reps in sidelying against gravity for improved over hip strength with standing/walking.    Baseline Eval= Patient unable to complete a full ROM hip abd left LE; 4/20: Pt fatigues after 5 performed with proper cuing    Time 12    Period Weeks    Status On-going    Target Date 08/09/21                   Plan - 07/31/21 1019     Clinical Impression Statement ?Patient performed well overall with no reported hip pain. Able to progress more with standing activities today as well as some resistive seated therex. Patient was able to follow all VC and visual demo with good return of demo today. Limited to 10 reps to ensure not overworking hip and gradually progress. Will plan to increased reps and add some balance/weight shift activities next visit. The pt will benefit from further skilled PT to continue to improve strength, balance and mobility    Personal Factors and Comorbidities Age  Examination-Activity Limitations Bend;Caring for Others;Carry;Lift;Squat;Stairs;Stand     Examination-Participation Restrictions Cleaning;Community Activity;Yard Work    [employer]chant [employer] officer Evolving/Moderate complexity    Rehab Potential Good    PT Frequency 2x / week    PT Duration 12 weeks    PT Treatment/Interventions ADLs/Self Care Home Management;Canalith Repostioning;Cryotherapy;Electrical Stimulation;Moist Heat;Traction;DME Instruction;Gait training;Ultrasound;Stair training;Functional mobility training;Therapeutic activities;Therapeutic exercise;Balance training;Neuromuscular re-education;Patient/family education;Manual techniques;Passive range of motion;Dry needling    PT Next Visit Plan Resume hip strengthening next session if pain levels remain manageable.    PT Home Exercise Plan 05/17/2021=Access Code: [email or web address]  URL: [email or web address]; 06/05/2021=Access Code: [email or web address]  URL: [email or web address]; 4/11: Access Code: [email or web address]; 4/13: [email or web address]; 07/25/2021=?Access Code: [email or web address]  URL: [email or web address]    Consulted and Agree with Plan of Care Patient             Patient will benefit from skilled therapeutic intervention in order to improve the following deficits and impairments:  Abnormal gait, Decreased activity tolerance, Decreased balance, Decreased endurance, Decreased mobility, Decreased strength, Difficulty walking, Hypomobility, Impaired flexibility  Visit Diagnosis: Difficulty in walking, not elsewhere classified  Muscle weakness (generalized)  Unsteadiness on feet  Abnormality of gait and mobility  Chronic left hip pain     Problem List Patient Active Problem List   Diagnosis Date Noted   Lumbar back pain 07/15/2021   Neuropathy 10/29/2018   Generalized osteoarthritis 07/10/2017   Advance directive discussed with patient 02/13/2016   Family history of breast cancer 07/20/2015   Hyperlipemia 02/12/2015   Sleep disturbance 04/20/2014   Balance problem 01/26/2013   Routine general  medical examination at a health care facility 08/16/2010   PES PLANUS 02/11/2010   UNEQUAL LEG LENGTH 02/11/2010   VARICOSE VEINS, LOWER EXTREMITIES 12/01/2007   Osteoporosis 06/24/2007   HEMORRHOIDS, INTERNAL 06/07/2007   Chronic constipation 06/07/2007   GERD 02/11/2007   Allergic rhinitis due to pollen 07/09/2006    Lewis Moccasin, PT 07/31/2021, 10:28 AM  [place], [place], [address] Phone: [phone]   Fax:  [phone]  Name: KRYSTALYNN RIDGEWAY MRN: [id number] Date of Birth: 10-02-1935

## 2021-08-01 DIAGNOSIS — M25552 Pain in left hip: Secondary | ICD-10-CM | POA: Diagnosis not present

## 2021-08-01 DIAGNOSIS — M545 Low back pain, unspecified: Secondary | ICD-10-CM | POA: Diagnosis not present

## 2021-08-01 DIAGNOSIS — M1712 Unilateral primary osteoarthritis, left knee: Secondary | ICD-10-CM | POA: Diagnosis not present

## 2021-08-06 ENCOUNTER — Ambulatory Visit: Payer: Medicare HMO

## 2021-08-06 DIAGNOSIS — R269 Unspecified abnormalities of gait and mobility: Secondary | ICD-10-CM

## 2021-08-06 DIAGNOSIS — R262 Difficulty in walking, not elsewhere classified: Secondary | ICD-10-CM

## 2021-08-06 DIAGNOSIS — M25552 Pain in left hip: Secondary | ICD-10-CM | POA: Diagnosis not present

## 2021-08-06 DIAGNOSIS — G8929 Other chronic pain: Secondary | ICD-10-CM | POA: Diagnosis not present

## 2021-08-06 DIAGNOSIS — M6281 Muscle weakness (generalized): Secondary | ICD-10-CM | POA: Diagnosis not present

## 2021-08-06 DIAGNOSIS — R2681 Unsteadiness on feet: Secondary | ICD-10-CM

## 2021-08-06 NOTE — Therapy (Signed)
[place] MAIN [facility] SERVICES [address] [place], [place], [address] Phone: [phone]   Fax:  [phone]  Physical Therapy Treatment  Patient Details  Name: Sheryl Miller MRN: [id number] Date of Birth: 1935-07-29 Referring Provider (PT): Dr. Donella Stade   Encounter Date: 08/06/2021   PT End of Session - 08/06/21 1022     Visit Number 17    Number of Visits 25    Date for PT Re-Evaluation 08/09/21    Authorization Type [employer]    Authorization Time Period 05/17/2021- 08/09/2021    Progress Note Due on Visit 20    PT Start Time 1015    PT Stop Time 1058    PT Time Calculation (min) 43 min    Equipment Utilized During Treatment Gait belt    Activity Tolerance Patient tolerated treatment well;No increased pain    Behavior During Therapy WFL for tasks assessed/performed             Past Medical History:  Diagnosis Date   Allergic rhinitis, cause unspecified    Breast cyst    Diverticulosis of colon (without mention of hemorrhage)    Esophageal reflux    Female stress incontinence    Internal hemorrhoids without mention of complication    Osteoporosis, unspecified    Other and unspecified hyperlipidemia    Raynaud's syndrome    Rheumatic fever    Unspecified constipation     Past Surgical History:  Procedure Laterality Date   ABDOMINAL HYSTERECTOMY     BLADDER REPAIR     BREAST BIOPSY Right 2000   fibrocystic disease   KNEE ARTHROSCOPY Right ~5/16   SHOULDER ARTHROSCOPY WITH ROTATOR CUFF REPAIR AND SUBACROMIAL DECOMPRESSION  08/2001   R, Scott Dean   SHOULDER SURGERY      In 2006, bilaterally shoulder surgery for rotator cuff.   TONSILLECTOMY     TUBAL LIGATION      There were no vitals filed for this visit.   Subjective Assessment - 08/06/21 1019     Subjective Patient reports having a good birthday and no pain. States husband is assisting with stretching.    Pertinent History Patient is a 86  year old female with reported history of hip weakness left side and recent balance trouble with recent fall and trip later to urgent care. Patient has medical history of leg length discrepancy, Osteoporosis, rheumatic fever.    Limitations Walking;Standing;Lifting    How long can you sit comfortably? no issues    How long can you stand comfortably? I haven't measured but I think maybe 20 min if I had to    How long can you walk comfortably? about a mile maybe    Patient Stated Goals I want to walk better and not trip, stronger going up steps    Currently in Pain? No/denies               INTERVENTIONS:   Therapeutic Exercises:  Standing at support bar: with CGA and Gait belt x 10 reps alt LE's  -Standing alternating step tap without UE support x 15 reps -Standing Hip ABD -Standing hip Ext -Standing hip flex with ER  -Standing donkey kick (Patient reported no pain in left hip)    -Seated Hip add with ball squeeze x 5 sec hold x 10 BLE - Seated hip abd with Green theraband x 10 BLE -Seated hamstring curl with GTB x 10 BLE  Education provided throughout session via VC/TC and demonstration  to facilitate movement at target joints and correct muscle activation for all testing and exercises performed.   Access Code: [email or web address] URL: [email or web address] Date: 08/06/2021 Prepared by: Sande Brothers  Exercises - Standing Hip Abduction with Counter Support  - 3 x weekly - 3 sets - 10 reps - 2 sec hold - Standing Hip Extension with Counter Support  - 1 x daily - 3 x weekly - 3 sets - 10 reps - 2 sec hold - Standing Forward Step Taps with Counter Support  - 1 x daily - 7 x weekly - 3 sets - 10 reps - Standing Clam with Resistance Loop  - 1 x daily - 3 x weekly - 3 sets - 10 reps - 2-3 sec hold - Standing Hip Extension with Knee Bent  - 1 x daily - 3 x weekly - 3 sets - 10 reps - 2-3 sec hold                        PT Education - 08/06/21 1022      Education Details Exercise technique    Person(s) Educated Patient    Methods Explanation;Demonstration;Tactile cues;Verbal cues    Comprehension Verbalized understanding;Returned demonstration;Verbal cues required;Tactile cues required;Need further instruction              PT Short Term Goals - 06/25/21 1046       PT SHORT TERM GOAL #1   Title Pt will be independent with initial HEP in order to improve strength and balance in order to decrease fall risk and improve function at home and work.    Baseline Eval= no formal HEP in place    Time 6    Period Weeks    Status Achieved    Target Date 08/09/21               PT Long Term Goals - 06/27/21 1005       PT LONG TERM GOAL #1   Title Pt will be independent with final HEP in order to improve strength and balance in order to decrease fall risk and improve function at home and work.    Baseline Eval= no formal HEP in place    Time 12    Period Weeks    Status On-going    Target Date 08/09/21      PT LONG TERM GOAL #2   Title Pt will improve FOTO to target score of 58 to display perceived improvements in ability to complete ADL's    Baseline Eval: 53; 06/25/21; 53 at eval    Time 12    Status On-going    Target Date 08/09/21      PT LONG TERM GOAL #3   Title Pt will improve BERG by at least 3 points in order to demonstrate clinically significant improvement in balance.    Baseline Eval: 45/56; 4/20: 55/56    Time 12    Period Weeks    Status New    Target Date 08/09/21      PT LONG TERM GOAL #4   Title Pt will decrease 5TSTS by at least 3 seconds in order to demonstrate clinically significant improvement in LE strength.    Baseline Eval: 16.07 sec; 06/25/21: 10.99    Time 12    Period Weeks    Status On-going    Target Date 08/09/21      PT LONG TERM GOAL #5   Title Patient will demonstrate improved Left hip abd  strength by performing 10 reps in sidelying against gravity for improved over hip strength with  standing/walking.    Baseline Eval= Patient unable to complete a full ROM hip abd left LE; 4/20: Pt fatigues after 5 performed with proper cuing    Time 12    Period Weeks    Status On-going    Target Date 08/09/21                   Plan - 08/06/21 1024     Clinical Impression Statement Patient presents with good motivation for today's session. She continues to progress with her hip strengthening exercises and no report of pain. She was issued and a standing hip HEP and verbalized good understanding of program. The pt will benefit from further skilled PT to continue to improve strength, balance and mobility    Personal Factors and Comorbidities Age    Examination-Activity Limitations Bend;Caring for Others;Carry;Lift;Squat;Stairs;Stand    Examination-Participation Restrictions Cleaning;Community Activity;Yard Work    Merchant navy officer Evolving/Moderate complexity    Rehab Potential Good    PT Frequency 2x / week    PT Duration 12 weeks    PT Treatment/Interventions ADLs/Self Care Home Management;Canalith Repostioning;Cryotherapy;Electrical Stimulation;Moist Heat;Traction;DME Instruction;Gait training;Ultrasound;Stair training;Functional mobility training;Therapeutic activities;Therapeutic exercise;Balance training;Neuromuscular re-education;Patient/family education;Manual techniques;Passive range of motion;Dry needling    PT Next Visit Plan Resume hip strengthening next session if pain levels remain manageable.    PT Home Exercise Plan 05/17/2021=Access Code: [email or web address]  URL: [email or web address]; 06/05/2021=Access Code: [email or web address]  URL: [email or web address]; 4/11: Access Code: [email or web address]; 4/13: [email or web address]; 07/25/2021=?Access Code: [email or web address]  URL: [email or web address]    Consulted and Agree with Plan of Care Patient             Patient will benefit from skilled therapeutic intervention in order to improve the following deficits  and impairments:  Abnormal gait, Decreased activity tolerance, Decreased balance, Decreased endurance, Decreased mobility, Decreased strength, Difficulty walking, Hypomobility, Impaired flexibility  Visit Diagnosis: Difficulty in walking, not elsewhere classified  Muscle weakness (generalized)  Unsteadiness on feet  Abnormality of gait and mobility  Chronic left hip pain     Problem List Patient Active Problem List   Diagnosis Date Noted   Lumbar back pain 07/15/2021   Neuropathy 10/29/2018   Generalized osteoarthritis 07/10/2017   Advance directive discussed with patient 02/13/2016   Family history of breast cancer 07/20/2015   Hyperlipemia 02/12/2015   Sleep disturbance 04/20/2014   Balance problem 01/26/2013   Routine general medical examination at a health care facility 08/16/2010   PES PLANUS 02/11/2010   UNEQUAL LEG LENGTH 02/11/2010   VARICOSE VEINS, LOWER EXTREMITIES 12/01/2007   Osteoporosis 06/24/2007   HEMORRHOIDS, INTERNAL 06/07/2007   Chronic constipation 06/07/2007   GERD 02/11/2007   Allergic rhinitis due to pollen 07/09/2006    Lewis Moccasin, PT 08/07/2021, 1:26 PM  [place] MAIN [facility] SERVICES [place], [place], [address] Phone: [phone]   Fax:  [phone]  Name: Sheryl Miller MRN: [id number] Date of Birth: 1935/05/14

## 2021-08-08 ENCOUNTER — Ambulatory Visit: Payer: Medicare HMO

## 2021-08-12 ENCOUNTER — Ambulatory Visit: Payer: Medicare HMO

## 2021-08-13 ENCOUNTER — Ambulatory Visit: Payer: Medicare HMO

## 2021-08-15 ENCOUNTER — Ambulatory Visit: Payer: Medicare HMO

## 2021-08-19 ENCOUNTER — Ambulatory Visit: Payer: Medicare HMO

## 2021-08-20 ENCOUNTER — Ambulatory Visit: Payer: Medicare HMO

## 2021-08-20 ENCOUNTER — Encounter: Payer: Medicare HMO | Admitting: Internal Medicine

## 2021-08-22 ENCOUNTER — Ambulatory Visit: Payer: Medicare HMO

## 2021-08-23 DIAGNOSIS — M25552 Pain in left hip: Secondary | ICD-10-CM | POA: Diagnosis not present

## 2021-08-23 DIAGNOSIS — M545 Low back pain, unspecified: Secondary | ICD-10-CM | POA: Diagnosis not present

## 2021-08-23 DIAGNOSIS — M1712 Unilateral primary osteoarthritis, left knee: Secondary | ICD-10-CM | POA: Diagnosis not present

## 2021-08-26 ENCOUNTER — Ambulatory Visit: Payer: Medicare HMO

## 2021-08-30 ENCOUNTER — Ambulatory Visit: Payer: Medicare HMO

## 2021-09-02 ENCOUNTER — Ambulatory Visit: Payer: Medicare HMO | Admitting: Dermatology

## 2021-09-02 DIAGNOSIS — L219 Seborrheic dermatitis, unspecified: Secondary | ICD-10-CM

## 2021-09-02 DIAGNOSIS — L814 Other melanin hyperpigmentation: Secondary | ICD-10-CM | POA: Diagnosis not present

## 2021-09-02 DIAGNOSIS — L820 Inflamed seborrheic keratosis: Secondary | ICD-10-CM | POA: Diagnosis not present

## 2021-09-02 DIAGNOSIS — L300 Nummular dermatitis: Secondary | ICD-10-CM

## 2021-09-02 DIAGNOSIS — Z1283 Encounter for screening for malignant neoplasm of skin: Secondary | ICD-10-CM | POA: Diagnosis not present

## 2021-09-02 DIAGNOSIS — L578 Other skin changes due to chronic exposure to nonionizing radiation: Secondary | ICD-10-CM | POA: Diagnosis not present

## 2021-09-02 DIAGNOSIS — L821 Other seborrheic keratosis: Secondary | ICD-10-CM

## 2021-09-02 DIAGNOSIS — D229 Melanocytic nevi, unspecified: Secondary | ICD-10-CM | POA: Diagnosis not present

## 2021-09-02 DIAGNOSIS — D1800 Hemangioma unspecified site: Secondary | ICD-10-CM

## 2021-09-02 DIAGNOSIS — L570 Actinic keratosis: Secondary | ICD-10-CM | POA: Diagnosis not present

## 2021-09-04 ENCOUNTER — Ambulatory Visit: Payer: Medicare HMO

## 2021-09-06 ENCOUNTER — Ambulatory Visit: Payer: Medicare HMO

## 2021-09-11 ENCOUNTER — Other Ambulatory Visit: Payer: Self-pay | Admitting: Dermatology

## 2021-09-12 ENCOUNTER — Ambulatory Visit: Payer: Medicare HMO

## 2021-09-19 ENCOUNTER — Ambulatory Visit: Payer: Medicare HMO

## 2021-09-25 ENCOUNTER — Ambulatory Visit: Payer: Medicare HMO

## 2021-09-29 ENCOUNTER — Other Ambulatory Visit: Payer: Self-pay | Admitting: Dermatology

## 2021-09-29 DIAGNOSIS — L219 Seborrheic dermatitis, unspecified: Secondary | ICD-10-CM

## 2021-10-02 ENCOUNTER — Ambulatory Visit: Payer: Medicare HMO

## 2021-10-05 ENCOUNTER — Other Ambulatory Visit: Payer: Self-pay | Admitting: Dermatology

## 2021-10-05 DIAGNOSIS — L219 Seborrheic dermatitis, unspecified: Secondary | ICD-10-CM

## 2021-10-08 ENCOUNTER — Ambulatory Visit: Payer: Medicare HMO

## 2021-10-10 ENCOUNTER — Ambulatory Visit: Payer: Medicare HMO

## 2021-10-15 ENCOUNTER — Ambulatory Visit: Payer: Medicare HMO

## 2021-10-17 ENCOUNTER — Ambulatory Visit: Payer: Medicare HMO

## 2021-10-22 ENCOUNTER — Ambulatory Visit: Payer: Medicare HMO

## 2021-10-24 ENCOUNTER — Ambulatory Visit: Payer: Medicare HMO

## 2021-10-25 ENCOUNTER — Ambulatory Visit: Payer: Self-pay | Admitting: Internal Medicine

## 2021-10-29 ENCOUNTER — Ambulatory Visit: Payer: Medicare HMO

## 2021-10-31 ENCOUNTER — Ambulatory Visit: Payer: Medicare HMO

## 2021-11-05 ENCOUNTER — Ambulatory Visit: Payer: Medicare HMO | Admitting: Dermatology

## 2021-11-05 ENCOUNTER — Ambulatory Visit: Payer: Medicare HMO

## 2021-11-05 DIAGNOSIS — I87303 Chronic venous hypertension (idiopathic) without complications of bilateral lower extremity: Secondary | ICD-10-CM

## 2021-11-05 DIAGNOSIS — L578 Other skin changes due to chronic exposure to nonionizing radiation: Secondary | ICD-10-CM | POA: Diagnosis not present

## 2021-11-05 DIAGNOSIS — L570 Actinic keratosis: Secondary | ICD-10-CM | POA: Diagnosis not present

## 2021-11-05 DIAGNOSIS — L821 Other seborrheic keratosis: Secondary | ICD-10-CM | POA: Diagnosis not present

## 2021-11-05 NOTE — Progress Notes (Signed)
Follow-Up Visit   Subjective  Sheryl Miller is a 86 y.o. female who presents for the following: Follow-up.  Patient presents for 2 month follow-up Aks of the left and right lower lip. She states the areas have improved, but she still feels something there. She also has a white spot on her upper lip she would like checked. No changes per pt. No growing, no bleeding, no scabbing.   The following portions of the chart were reviewed this encounter and updated as appropriate:       Review of Systems:  No other skin or systemic complaints except as noted in HPI or Assessment and Plan.  Objective  Well appearing patient in no apparent distress; mood and affect are within normal limits.  A focused examination was performed including face. Relevant physical exam findings are noted in the Assessment and Plan.  L lower lip x 1, R lower lip x 2 (3) Keratotic macules.  Left Upper Lip 4.0 mm flesh white thin papule       BL Lower Legs Mild edema of the lower legs, tender to touch/pressure    Assessment & Plan  Actinic Damage - chronic, secondary to cumulative UV radiation exposure/sun exposure over time - diffuse scaly erythematous macules with underlying dyspigmentation - Recommend daily broad spectrum sunscreen SPF 30+ to sun-exposed areas, reapply every 2 hours as needed.  - Recommend staying in the shade or wearing long sleeves, sun glasses (UVA+UVB protection) and wide brim hats (4-inch brim around the entire circumference of the hat). - Call for new or changing lesions.  AK (actinic keratosis) (3) L lower lip x 1, R lower lip x 2  Actinic keratoses are precancerous spots that appear secondary to cumulative UV radiation exposure/sun exposure over time. They are chronic with expected duration over 1 year. A portion of actinic keratoses will progress to squamous cell carcinoma of the skin. It is not possible to reliably predict which spots will progress to skin cancer and  so treatment is recommended to prevent development of skin cancer.  Recommend daily broad spectrum sunscreen SPF 30+ to sun-exposed areas, reapply every 2 hours as needed.  Recommend staying in the shade or wearing long sleeves, sun glasses (UVA+UVB protection) and wide brim hats (4-inch brim around the entire circumference of the hat). Call for new or changing lesions.  Destruction of lesion - L lower lip x 1, R lower lip x 2  Destruction method: cryotherapy   Informed consent: discussed and consent obtained   Lesion destroyed using liquid nitrogen: Yes   Region frozen until ice ball extended beyond lesion: Yes   Outcome: patient tolerated procedure well with no complications   Post-procedure details: wound care instructions given   Additional details:  Prior to procedure, discussed risks of blister formation, small wound, skin dyspigmentation, or rare scar following cryotherapy. Recommend Vaseline ointment to treated areas while healing.   Seborrheic keratosis Left Upper Lip  vs Sebaceous Hyperplasia  Benign appearing. Observation.  Photo today.  Recheck on f/up   Stasis edema of both lower extremities BL Lower Legs  Stasis in the legs causes chronic leg swelling, which may result in itchy or painful rashes, skin discoloration, skin texture changes, and sometimes ulceration.  Recommend daily graduated compression hose/stockings- easiest to put on first thing in morning, remove at bedtime.  Elevate legs as much as possible. Avoid salt/sodium rich foods.  Recommend [employer] for compression socks    Return in about 10 months (around 09/06/2022) for  TBSE. Also 3-4 mos for AK f/u lips.Lindi Adie, CMA, am acting as scribe for Brendolyn Patty, MD .  Documentation: I have reviewed the above documentation for accuracy and completeness, and I agree with the above.  Brendolyn Patty MD

## 2021-11-05 NOTE — Patient Instructions (Addendum)
Stasis in the legs causes chronic leg swelling, which may result in itchy or painful rashes, skin discoloration, skin texture changes, and sometimes ulceration.  Recommend daily graduated compression hose/stockings- easiest to put on first thing in morning, remove at bedtime. May purchase at [place]. Elevate legs as much as possible. Avoid salt/sodium rich foods.  Cryotherapy Aftercare  Wash gently with soap and water everyday.   Apply Vaseline daily until healed.   Due to recent changes in healthcare laws, you may see results of your pathology and/or laboratory studies on MyChart before the doctors have had a chance to review them. We understand that in some cases there may be results that are confusing or concerning to you. Please understand that not all results are received at the same time and often the doctors may need to interpret multiple results in order to provide you with the best plan of care or course of treatment. Therefore, we ask that you please give Sheryl Miller 2 business days to thoroughly review all your results before contacting the office for clarification. Should we see a critical lab result, you will be contacted sooner.   If You Need Anything After Your Visit  If you have any questions or concerns for your doctor, please call our main line at [phone] and press option 4 to reach your doctor's medical assistant. If no one answers, please leave a voicemail as directed and we will return your call as soon as possible. Messages left after 4 pm will be answered the following business day.   You may also send Sheryl Miller a message via [place]. We typically respond to MyChart messages within 1-2 business days.  For prescription refills, please ask your pharmacy to contact our office. Our fax number is [phone].  If you have an urgent issue when the clinic is closed that cannot wait until the next business day, you can page your doctor at the number below.    Please note that  while we do our best to be available for urgent issues outside of office hours, we are not available 24/7.   If you have an urgent issue and are unable to reach Sheryl Miller, you may choose to seek medical care at your doctor's office, retail clinic, urgent care center, or emergency room.  If you have a medical emergency, please immediately call 911 or go to the emergency department.  Pager Numbers  - Dr. Nehemiah Massed: [phone]  - Dr. Laurence Ferrari: [phone]  - Dr. Nicole Kindred: [phone]  In the event of inclement weather, please call our main line at [phone] for an update on the status of any delays or closures.  Dermatology Medication Tips: Please keep the boxes that topical medications come in in order to help keep track of the instructions about where and how to use these. Pharmacies typically print the medication instructions only on the boxes and not directly on the medication tubes.   If your medication is too expensive, please contact our office at [phone] option 4 or send Sheryl Miller a message through [place].   We are unable to tell what your co-pay for medications will be in advance as this is different depending on your insurance coverage. However, we may be able to find a substitute medication at lower cost or fill out paperwork to get insurance to cover a needed medication.   If a prior authorization is required to get your medication covered by your insurance company, please allow Sheryl Miller 1-2 business days to complete this process.  Drug prices  often vary depending on where the prescription is filled and some pharmacies may offer cheaper prices.  The website www.goodrx.com contains coupons for medications through different pharmacies. The prices here do not account for what the cost may be with help from insurance (it may be cheaper with your insurance), but the website can give you the price if you did not use any insurance.  - You can print the associated coupon and take it with your  prescription to the pharmacy.  - You may also stop by our office during regular business hours and pick up a GoodRx coupon card.  - If you need your prescription sent electronically to a different pharmacy, notify our office through [facility] or by phone at [phone] option 4.     Si Usted Necesita Algo Despus de Su Visita  Tambin puede enviarnos un mensaje a travs de Pharmacist, community. Por lo general respondemos a los mensajes de MyChart en el transcurso de 1 a 2 das hbiles.  Para renovar recetas, por favor pida a su farmacia que se ponga en contacto con nuestra oficina. Harland Dingwall de fax es [place] [phone].  Si tiene un asunto urgente cuando la clnica est cerrada y que no puede esperar hasta el siguiente da hbil, puede llamar/localizar a su doctor(a) al nmero que aparece a continuacin.   Por favor, tenga en cuenta que aunque hacemos todo lo posible para estar disponibles para asuntos urgentes fuera del horario de [place], no estamos disponibles las 24 horas del da, los 7 das de la [place].   Si tiene un problema urgente y no puede comunicarse con nosotros, puede optar por buscar atencin mdica  en el consultorio de su doctor(a), en una clnica privada, en un centro de atencin urgente o en una sala de emergencias.  Si tiene Engineering geologist, por favor llame inmediatamente al 911 o vaya a la sala de emergencias.  Nmeros de bper  - Dr. Nehemiah Massed: [phone]  - Dra. Moye: [phone]  - Dra. Nicole Kindred: [phone]  En caso de inclemencias del [place], por favor llame a Johnsie Kindred principal al [phone] para una actualizacin sobre el [place] de cualquier retraso o cierre.  Consejos para la medicacin en dermatologa: Por favor, guarde las cajas en las que vienen los medicamentos de uso tpico para ayudarle a seguir las instrucciones sobre dnde y cmo usarlos. Las farmacias generalmente imprimen las instrucciones del medicamento slo en las cajas y no  directamente en los tubos del [place].   Si su medicamento es muy caro, por favor, pngase en contacto con Zigmund Daniel llamando al [phone] y presione la opcin 4 o envenos un mensaje a travs de Pharmacist, community.   No podemos decirle cul ser su copago por los medicamentos por adelantado ya que esto es diferente dependiendo de la cobertura de su seguro. Sin embargo, es posible que podamos encontrar un medicamento sustituto a Electrical engineer un formulario para que el seguro cubra el medicamento que se considera necesario.   Si se requiere una autorizacin previa para que su compaa de seguros [place] su medicamento, por favor permtanos de 1 a 2 das hbiles para completar este proceso.  Los precios de los medicamentos varan con frecuencia dependiendo del Environmental consultant de dnde se surte la receta y alguna farmacias pueden ofrecer precios ms baratos.  El sitio web www.goodrx.com tiene cupones para medicamentos de Airline pilot. Los precios aqu no tienen en cuenta lo que podra costar con la ayuda del seguro (puede ser ms barato con  su seguro), pero el sitio web puede darle el precio si no Field seismologist.  - Puede imprimir el cupn correspondiente y llevarlo con su receta a la farmacia.  - Tambin puede pasar por nuestra oficina durante el horario de atencin regular y Charity fundraiser una tarjeta de cupones de GoodRx.  - Si necesita que su receta se enve electrnicamente a una farmacia diferente, informe a nuestra oficina a travs de MyChart de [place] o por telfono llamando al [phone] y presione la opcin 4.

## 2021-11-07 ENCOUNTER — Ambulatory Visit: Payer: Medicare HMO

## 2022-01-03 ENCOUNTER — Other Ambulatory Visit: Payer: Self-pay | Admitting: Dermatology

## 2022-01-03 DIAGNOSIS — L219 Seborrheic dermatitis, unspecified: Secondary | ICD-10-CM

## 2022-01-06 ENCOUNTER — Other Ambulatory Visit: Payer: Self-pay | Admitting: Orthopedic Surgery

## 2022-01-06 ENCOUNTER — Encounter
Admission: RE | Admit: 2022-01-06 | Discharge: 2022-01-06 | Disposition: A | Discharge status: Home / Self Care | Payer: Medicare HMO | Source: Ambulatory Visit | Attending: Orthopedic Surgery | Admitting: Orthopedic Surgery

## 2022-01-06 VITALS — BP 126/69 | HR 85 | Temp 97.6°F | Resp 14 | Ht 62.0 in | Wt 157.0 lb

## 2022-01-06 DIAGNOSIS — Z01812 Encounter for preprocedural laboratory examination: Secondary | ICD-10-CM

## 2022-01-06 DIAGNOSIS — E7849 Other hyperlipidemia: Secondary | ICD-10-CM

## 2022-01-06 DIAGNOSIS — Z01818 Encounter for other preprocedural examination: Secondary | ICD-10-CM | POA: Diagnosis not present

## 2022-01-06 LAB — CBC WITH DIFFERENTIAL/PLATELET
Abs Immature Granulocytes: 0.02 10*3/uL (ref 0.00–0.07)
Basophils Absolute: 0 10*3/uL (ref 0.0–0.1)
Basophils Relative: 1 %
Eosinophils Absolute: 0.1 10*3/uL (ref 0.0–0.5)
Eosinophils Relative: 1 %
HCT: 36.9 % (ref 36.0–46.0)
Hemoglobin: 12.3 g/dL (ref 12.0–15.0)
Immature Granulocytes: 0 %
Lymphocytes Relative: 24 %
Lymphs Abs: 1.2 10*3/uL (ref 0.7–4.0)
MCH: 30.7 pg (ref 26.0–34.0)
MCHC: 33.3 g/dL (ref 30.0–36.0)
MCV: 92 fL (ref 80.0–100.0)
Monocytes Absolute: 0.6 10*3/uL (ref 0.1–1.0)
Monocytes Relative: 11 %
Neutro Abs: 3.2 10*3/uL (ref 1.7–7.7)
Neutrophils Relative %: 63 %
Platelets: 232 10*3/uL (ref 150–400)
RBC: 4.01 MIL/uL (ref 3.87–5.11)
RDW: 12.6 % (ref 11.5–15.5)
WBC: 5.1 10*3/uL (ref 4.0–10.5)
nRBC: 0 % (ref 0.0–0.2)

## 2022-01-06 LAB — BASIC METABOLIC PANEL
Anion gap: 5 (ref 5–15)
BUN: 12 mg/dL (ref 8–23)
CO2: 28 mmol/L (ref 22–32)
Calcium: 9 mg/dL (ref 8.9–10.3)
Chloride: 100 mmol/L (ref 98–111)
Creatinine, Ser: 0.64 mg/dL (ref 0.44–1.00)
GFR, Estimated: >60 mL/min (ref >60)
Glucose, Bld: 105 mg/dL — ABNORMAL HIGH (ref 70–99)
Potassium: 4 mmol/L (ref 3.5–5.1)
Sodium: 133 mmol/L — ABNORMAL LOW (ref 135–145)

## 2022-01-06 LAB — SURGICAL PCR SCREEN
MRSA, PCR: NEGATIVE
Staphylococcus aureus: NEGATIVE

## 2022-01-06 LAB — PROTIME-INR
INR: 1 (ref 0.8–1.2)
Prothrombin Time: 13.1 seconds (ref 11.4–15.2)

## 2022-01-06 LAB — URINALYSIS, ROUTINE W REFLEX MICROSCOPIC
Bilirubin Urine: NEGATIVE
Glucose, UA: NEGATIVE mg/dL
Hgb urine dipstick: NEGATIVE
Ketones, ur: NEGATIVE mg/dL
Leukocytes,Ua: NEGATIVE
Nitrite: NEGATIVE
Protein, ur: NEGATIVE mg/dL
Specific Gravity, Urine: 1.014 (ref 1.005–1.030)
pH: 5 (ref 5.0–8.0)

## 2022-01-06 LAB — TYPE AND SCREEN
ABO/RH(D): A POS
Antibody Screen: NEGATIVE

## 2022-01-06 LAB — APTT: aPTT: 27 seconds (ref 24–36)

## 2022-01-06 NOTE — Patient Instructions (Addendum)
Your procedure is scheduled on: Tuesday, November 7 Report to the Registration Desk on the 1st floor of the [facility]. To find out your arrival time, please call [phone] between 1PM - 3PM on: Monday, November 6 If your arrival time is 6:00 am, do not arrive prior to that time as the [facility] entrance doors do not open until 6:00 am.  REMEMBER: Instructions that are not followed completely may result in serious medical risk, up to and including death; or upon the discretion of your surgeon and anesthesiologist your surgery may need to be rescheduled.  Do not eat or drink after midnight the night before surgery.  No gum chewing, lozengers or hard candies.  DO NOT TAKE ANY MEDICATIONS THE MORNING OF SURGERY  YOU MAY TAKE YOUR AM EYE DROPS  One week prior to surgery: starting October 31 Stop Anti-inflammatories (NSAIDS) such as Advil, Aleve, Ibuprofen, Motrin, Naproxen, Naprosyn and Aspirin based products such as Excedrin, Goodys Powder, BC Powder. Stop ANY OVER THE COUNTER supplements until after surgery. Stop vitamin C, melatonin, multiple vitamins, probiotic, turmeric, coQ10, Vitamin D.  You may however, continue to take Tylenol if needed for pain up until the day of surgery.  No Alcohol for 24 hours before or after surgery.  On the morning of surgery brush your teeth with toothpaste and water, you may rinse your mouth with mouthwash if you wish. Do not swallow any toothpaste or mouthwash.  Use CHG Soap as directed on instruction sheet.  Do not wear jewelry, make-up, hairpins, clips or nail polish.  Do not wear lotions, powders, or perfumes.   Do not shave body from the neck down 48 hours prior to surgery just in case you cut yourself which could leave a site for infection.  Also, freshly shaved skin may become irritated if using the CHG soap.  Contact lenses, hearing aids and dentures may not be worn into surgery.  Do not bring valuables to the hospital. [facility]  is not responsible for any missing/lost belongings or valuables.   Notify your doctor if there is any change in your medical condition (cold, fever, infection).  Wear comfortable clothing (specific to your surgery type) to the hospital.  After surgery, you can help prevent lung complications by doing breathing exercises.  Take deep breaths and cough every 1-2 hours. Your doctor may order a device called an Incentive Spirometer to help you take deep breaths.  If you are being admitted to the hospital overnight, leave your suitcase in the car. After surgery it may be brought to your room.  If you are being discharged the day of surgery, you will not be allowed to drive home. You will need a responsible adult (18 years or older) to drive you home and stay with you that night.   If you are taking public transportation, you will need to have a responsible adult (18 years or older) with you. Please confirm with your physician that it is acceptable to use public transportation.   Please call the [facility] at [phone] if you have any questions about these instructions.  Surgery Visitation Policy:  Patients undergoing a surgery or procedure may have two family members or support persons with them as long as the person is not COVID-19 positive or experiencing its symptoms.   Inpatient Visitation:    Visiting hours are 7 a.m. to 8 p.m. Up to four visitors are allowed at one time in a patient room, including children. The visitors may  rotate out with other people during the day. One designated support person (adult) may remain overnight.      Preparing for Surgery with CHLORHEXIDINE GLUCONATE (CHG) Soap  Chlorhexidine Gluconate (CHG) Soap  o An antiseptic cleaner that kills germs and bonds with the skin to continue killing germs even after washing  o Used for showering the night before surgery and morning of surgery  Before surgery, you can play an important role  by reducing the number of germs on your skin.  CHG (Chlorhexidine gluconate) soap is an antiseptic cleanser which kills germs and bonds with the skin to continue killing germs even after washing.  Please do not use if you have an allergy to CHG or antibacterial soaps. If your skin becomes reddened/irritated stop using the CHG.  1. Shower the NIGHT BEFORE SURGERY and the MORNING OF SURGERY with CHG soap.  2. If you choose to wash your hair, wash your hair first as usual with your normal shampoo.  3. After shampooing, rinse your hair and body thoroughly to remove the shampoo.  4. Use CHG as you would any other liquid soap. You can apply CHG directly to the skin and wash gently with a scrungie or a clean washcloth.  5. Apply the CHG soap to your body only from the neck down. Do not use on open wounds or open sores. Avoid contact with your eyes, ears, mouth, and genitals (private parts). Wash face and genitals (private parts) with your normal soap.  6. Wash thoroughly, paying special attention to the area where your surgery will be performed.  7. Thoroughly rinse your body with warm water.  8. Do not shower/wash with your normal soap after using and rinsing off the CHG soap.  9. Pat yourself dry with a clean towel.  10. Wear clean pajamas to bed the night before surgery.  12. Place clean sheets on your bed the night of your first shower and do not sleep with pets.  13. Shower again with the CHG soap on the day of surgery prior to arriving at the hospital.  14. Do not apply any deodorants/lotions/powders.  15. Please wear clean clothes to the hospital.

## 2022-01-13 MED ORDER — TRANEXAMIC ACID-NACL 1000-0.7 MG/100ML-% IV SOLN
1000.0000 mg | INTRAVENOUS | Status: AC
  Administered 2022-01-14: 1000 mg via INTRAVENOUS

## 2022-01-13 MED ORDER — ORAL CARE MOUTH RINSE: 15.0000 mL | Freq: Once | OROMUCOSAL | Status: AC

## 2022-01-13 MED ORDER — FAMOTIDINE 20 MG PO TABS
20.0000 mg | ORAL_TABLET | Freq: Once | ORAL | Status: AC
  Administered 2022-01-14: 20 mg via ORAL

## 2022-01-13 MED ORDER — LACTATED RINGERS IV SOLN: INTRAVENOUS | Status: DC

## 2022-01-13 MED ORDER — CHLORHEXIDINE GLUCONATE 0.12 % MT SOLN
15.0000 mL | Freq: Once | OROMUCOSAL | Status: AC
  Administered 2022-01-14: 15 mL via OROMUCOSAL

## 2022-01-14 ENCOUNTER — Observation Stay: Payer: Medicare HMO

## 2022-01-14 ENCOUNTER — Other Ambulatory Visit: Payer: Self-pay

## 2022-01-14 ENCOUNTER — Ambulatory Visit: Payer: Medicare HMO | Admitting: Anesthesiology

## 2022-01-14 ENCOUNTER — Encounter: Payer: Self-pay | Admitting: Orthopedic Surgery

## 2022-01-14 ENCOUNTER — Inpatient Hospital Stay
Admission: RE | Admit: 2022-01-14 | Discharge: 2022-01-20 | DRG: 470 | Disposition: A | Discharge status: Home Health | Payer: Medicare HMO | Attending: Orthopedic Surgery | Admitting: Orthopedic Surgery

## 2022-01-14 ENCOUNTER — Encounter
Admission: RE | Disposition: A | Discharge status: Home Health | Payer: Self-pay | Source: Home / Self Care | Attending: Orthopedic Surgery

## 2022-01-14 ENCOUNTER — Ambulatory Visit: Payer: Medicare HMO | Admitting: Urgent Care

## 2022-01-14 DIAGNOSIS — K219 Gastro-esophageal reflux disease without esophagitis: Secondary | ICD-10-CM | POA: Diagnosis present

## 2022-01-14 DIAGNOSIS — Z803 Family history of malignant neoplasm of breast: Secondary | ICD-10-CM

## 2022-01-14 DIAGNOSIS — E871 Hypo-osmolality and hyponatremia: Secondary | ICD-10-CM

## 2022-01-14 DIAGNOSIS — Z96652 Presence of left artificial knee joint: Principal | ICD-10-CM

## 2022-01-14 DIAGNOSIS — R112 Nausea with vomiting, unspecified: Secondary | ICD-10-CM | POA: Diagnosis present

## 2022-01-14 DIAGNOSIS — Z833 Family history of diabetes mellitus: Secondary | ICD-10-CM

## 2022-01-14 DIAGNOSIS — E785 Hyperlipidemia, unspecified: Secondary | ICD-10-CM | POA: Diagnosis present

## 2022-01-14 DIAGNOSIS — D649 Anemia, unspecified: Secondary | ICD-10-CM | POA: Diagnosis present

## 2022-01-14 DIAGNOSIS — I7300 Raynaud's syndrome without gangrene: Secondary | ICD-10-CM | POA: Diagnosis present

## 2022-01-14 DIAGNOSIS — Z90710 Acquired absence of both cervix and uterus: Secondary | ICD-10-CM

## 2022-01-14 DIAGNOSIS — M810 Age-related osteoporosis without current pathological fracture: Secondary | ICD-10-CM | POA: Diagnosis present

## 2022-01-14 DIAGNOSIS — R911 Solitary pulmonary nodule: Secondary | ICD-10-CM | POA: Diagnosis present

## 2022-01-14 DIAGNOSIS — Z825 Family history of asthma and other chronic lower respiratory diseases: Secondary | ICD-10-CM

## 2022-01-14 DIAGNOSIS — M1712 Unilateral primary osteoarthritis, left knee: Principal | ICD-10-CM | POA: Diagnosis present

## 2022-01-14 DIAGNOSIS — E222 Syndrome of inappropriate secretion of antidiuretic hormone: Secondary | ICD-10-CM | POA: Diagnosis present

## 2022-01-14 DIAGNOSIS — Z823 Family history of stroke: Secondary | ICD-10-CM

## 2022-01-14 DIAGNOSIS — D6489 Other specified anemias: Secondary | ICD-10-CM | POA: Diagnosis present

## 2022-01-14 DIAGNOSIS — E876 Hypokalemia: Secondary | ICD-10-CM | POA: Diagnosis present

## 2022-01-14 DIAGNOSIS — Z8249 Family history of ischemic heart disease and other diseases of the circulatory system: Secondary | ICD-10-CM

## 2022-01-14 HISTORY — PX: TOTAL KNEE ARTHROPLASTY: SHX125

## 2022-01-14 LAB — CBC
HCT: 35.8 % — ABNORMAL LOW (ref 36.0–46.0)
Hemoglobin: 12.1 g/dL (ref 12.0–15.0)
MCH: 30.4 pg (ref 26.0–34.0)
MCHC: 33.8 g/dL (ref 30.0–36.0)
MCV: 89.9 fL (ref 80.0–100.0)
Platelets: 233 10*3/uL (ref 150–400)
RBC: 3.98 MIL/uL (ref 3.87–5.11)
RDW: 12.7 % (ref 11.5–15.5)
WBC: 6.1 10*3/uL (ref 4.0–10.5)
nRBC: 0 % (ref 0.0–0.2)

## 2022-01-14 LAB — CREATININE, SERUM
Creatinine, Ser: 0.61 mg/dL (ref 0.44–1.00)
GFR, Estimated: >60 mL/min (ref >60)

## 2022-01-14 LAB — ABO/RH: ABO/RH(D): A POS

## 2022-01-14 SURGERY — ARTHROPLASTY, KNEE, TOTAL
Anesthesia: Spinal | Site: Knee | Laterality: Left

## 2022-01-14 MED ORDER — METOCLOPRAMIDE HCL 5 MG PO TABS: 5.0000 mg | ORAL_TABLET | Freq: Three times a day (TID) | ORAL | Status: DC | PRN

## 2022-01-14 MED ORDER — ACETAMINOPHEN 500 MG PO TABS
ORAL_TABLET | ORAL | Status: AC
  Filled 2022-01-14: qty 2

## 2022-01-14 MED ORDER — SENNOSIDES-DOCUSATE SODIUM 8.6-50 MG PO TABS: 1.0000 | ORAL_TABLET | Freq: Every evening | ORAL | Status: DC | PRN

## 2022-01-14 MED ORDER — CHLORHEXIDINE GLUCONATE 0.12 % MT SOLN
OROMUCOSAL | Status: AC
  Filled 2022-01-14: qty 15

## 2022-01-14 MED ORDER — BUPIVACAINE HCL (PF) 0.5 % IJ SOLN
INTRAMUSCULAR | Status: DC | PRN
  Administered 2022-01-14: 3 mL

## 2022-01-14 MED ORDER — CEFAZOLIN SODIUM-DEXTROSE 2-4 GM/100ML-% IV SOLN
2.0000 g | INTRAVENOUS | Status: AC
  Administered 2022-01-14: 2 g via INTRAVENOUS

## 2022-01-14 MED ORDER — RISAQUAD PO CAPS
1.0000 | ORAL_CAPSULE | Freq: Every day | ORAL | Status: DC
  Administered 2022-01-14 – 2022-01-20 (×7): 1 via ORAL
  Filled 2022-01-14 (×7): qty 1

## 2022-01-14 MED ORDER — SIMVASTATIN 20 MG PO TABS
40.0000 mg | ORAL_TABLET | Freq: Every day | ORAL | Status: DC
  Administered 2022-01-14 – 2022-01-19 (×6): 40 mg via ORAL
  Filled 2022-01-14 (×7): qty 2

## 2022-01-14 MED ORDER — ENOXAPARIN SODIUM 40 MG/0.4ML IJ SOSY: 40.0000 mg | PREFILLED_SYRINGE | INTRAMUSCULAR | Status: DC

## 2022-01-14 MED ORDER — OXYCODONE HCL 5 MG PO TABS
5.0000 mg | ORAL_TABLET | ORAL | Status: DC | PRN
  Administered 2022-01-15 – 2022-01-17 (×4): 5 mg via ORAL
  Filled 2022-01-14 (×4): qty 1

## 2022-01-14 MED ORDER — CEFAZOLIN SODIUM-DEXTROSE 2-4 GM/100ML-% IV SOLN
INTRAVENOUS | Status: AC
  Filled 2022-01-14: qty 100

## 2022-01-14 MED ORDER — OXYCODONE HCL 5 MG PO TABS: 10.0000 mg | ORAL_TABLET | ORAL | Status: DC | PRN

## 2022-01-14 MED ORDER — ONDANSETRON HCL 4 MG/2ML IJ SOLN
INTRAMUSCULAR | Status: AC
  Filled 2022-01-14: qty 2

## 2022-01-14 MED ORDER — ENOXAPARIN SODIUM 30 MG/0.3ML IJ SOSY
30.0000 mg | PREFILLED_SYRINGE | Freq: Two times a day (BID) | INTRAMUSCULAR | Status: DC
  Administered 2022-01-15 – 2022-01-20 (×11): 30 mg via SUBCUTANEOUS
  Filled 2022-01-14 (×11): qty 0.3

## 2022-01-14 MED ORDER — METHOCARBAMOL 1000 MG/10ML IJ SOLN: 500.0000 mg | Freq: Four times a day (QID) | INTRAVENOUS | Status: DC | PRN

## 2022-01-14 MED ORDER — METHOCARBAMOL 500 MG PO TABS
500.0000 mg | ORAL_TABLET | Freq: Four times a day (QID) | ORAL | Status: DC | PRN
  Administered 2022-01-16: 500 mg via ORAL
  Filled 2022-01-14: qty 1

## 2022-01-14 MED ORDER — OXYCODONE HCL 5 MG PO TABS: 5.0000 mg | ORAL_TABLET | Freq: Once | ORAL | Status: DC | PRN

## 2022-01-14 MED ORDER — BUPIVACAINE LIPOSOME 1.3 % IJ SUSP
INTRAMUSCULAR | Status: AC
  Filled 2022-01-14: qty 20

## 2022-01-14 MED ORDER — DOCUSATE SODIUM 100 MG PO CAPS
100.0000 mg | ORAL_CAPSULE | Freq: Two times a day (BID) | ORAL | Status: DC
  Administered 2022-01-14 – 2022-01-16 (×4): 100 mg via ORAL
  Filled 2022-01-14 (×4): qty 1

## 2022-01-14 MED ORDER — OXYCODONE HCL 5 MG/5ML PO SOLN: 5.0000 mg | Freq: Once | ORAL | Status: DC | PRN

## 2022-01-14 MED ORDER — SODIUM CHLORIDE 0.9 % IR SOLN
Status: DC | PRN
  Administered 2022-01-14: 3000 mL

## 2022-01-14 MED ORDER — GABAPENTIN 100 MG PO CAPS: 100.0000 mg | ORAL_CAPSULE | Freq: Every day | ORAL | Status: DC | PRN

## 2022-01-14 MED ORDER — TRAMADOL HCL 50 MG PO TABS
50.0000 mg | ORAL_TABLET | Freq: Four times a day (QID) | ORAL | Status: DC
  Administered 2022-01-14 – 2022-01-20 (×23): 50 mg via ORAL
  Filled 2022-01-14 (×25): qty 1

## 2022-01-14 MED ORDER — ONDANSETRON HCL 4 MG/2ML IJ SOLN
4.0000 mg | Freq: Four times a day (QID) | INTRAMUSCULAR | Status: DC | PRN
  Administered 2022-01-15 – 2022-01-16 (×2): 4 mg via INTRAVENOUS
  Filled 2022-01-14 (×3): qty 2

## 2022-01-14 MED ORDER — HYDROMORPHONE HCL 1 MG/ML IJ SOLN
0.5000 mg | INTRAMUSCULAR | Status: DC | PRN
  Administered 2022-01-15: 0.5 mg via INTRAVENOUS
  Filled 2022-01-14: qty 1

## 2022-01-14 MED ORDER — PHENYLEPHRINE HCL-NACL 20-0.9 MG/250ML-% IV SOLN
INTRAVENOUS | Status: DC | PRN
  Administered 2022-01-14: 20 ug/min via INTRAVENOUS

## 2022-01-14 MED ORDER — TRAZODONE HCL 50 MG PO TABS: 150.0000 mg | ORAL_TABLET | Freq: Every evening | ORAL | Status: DC | PRN

## 2022-01-14 MED ORDER — BISACODYL 5 MG PO TBEC
5.0000 mg | DELAYED_RELEASE_TABLET | Freq: Every day | ORAL | Status: DC | PRN
  Administered 2022-01-17 – 2022-01-18 (×2): 5 mg via ORAL
  Filled 2022-01-14 (×3): qty 1

## 2022-01-14 MED ORDER — BUPIVACAINE-EPINEPHRINE (PF) 0.25% -1:200000 IJ SOLN
INTRAMUSCULAR | Status: AC
  Filled 2022-01-14: qty 60

## 2022-01-14 MED ORDER — SEVOFLURANE IN SOLN
RESPIRATORY_TRACT | Status: AC
  Filled 2022-01-14: qty 250

## 2022-01-14 MED ORDER — FENTANYL CITRATE (PF) 100 MCG/2ML IJ SOLN: 25.0000 ug | INTRAMUSCULAR | Status: DC | PRN

## 2022-01-14 MED ORDER — ADULT MULTIVITAMIN W/MINERALS CH
1.0000 | ORAL_TABLET | Freq: Every day | ORAL | Status: DC
  Administered 2022-01-14 – 2022-01-20 (×7): 1 via ORAL
  Filled 2022-01-14 (×7): qty 1

## 2022-01-14 MED ORDER — ONDANSETRON HCL 4 MG/2ML IJ SOLN
INTRAMUSCULAR | Status: DC | PRN
  Administered 2022-01-14: 4 mg via INTRAVENOUS

## 2022-01-14 MED ORDER — SODIUM CHLORIDE 0.9 % IV SOLN
INTRAVENOUS | Status: DC | PRN
  Administered 2022-01-14: 60 mL

## 2022-01-14 MED ORDER — NEOMYCIN-POLYMYXIN B GU 40-200000 IR SOLN
Status: DC | PRN
  Administered 2022-01-14: 12 mL
  Administered 2022-01-14: 2 mL

## 2022-01-14 MED ORDER — CHLORHEXIDINE GLUCONATE CLOTH 2 % EX PADS
6.0000 | MEDICATED_PAD | Freq: Once | CUTANEOUS | Status: AC
  Administered 2022-01-14: 6 via TOPICAL

## 2022-01-14 MED ORDER — MELATONIN 5 MG PO TABS
5.0000 mg | ORAL_TABLET | Freq: Every day | ORAL | Status: DC
  Administered 2022-01-14 – 2022-01-19 (×6): 5 mg via ORAL
  Filled 2022-01-14 (×7): qty 1

## 2022-01-14 MED ORDER — 0.9 % SODIUM CHLORIDE (POUR BTL) OPTIME
TOPICAL | Status: DC | PRN
  Administered 2022-01-14: 500 mL

## 2022-01-14 MED ORDER — METOCLOPRAMIDE HCL 5 MG/ML IJ SOLN: 5.0000 mg | Freq: Three times a day (TID) | INTRAMUSCULAR | Status: DC | PRN

## 2022-01-14 MED ORDER — FAMOTIDINE 20 MG PO TABS
ORAL_TABLET | ORAL | Status: AC
  Filled 2022-01-14: qty 1

## 2022-01-14 MED ORDER — ACETAMINOPHEN 500 MG PO TABS
1000.0000 mg | ORAL_TABLET | ORAL | Status: AC
  Administered 2022-01-14: 1000 mg via ORAL

## 2022-01-14 MED ORDER — SODIUM CHLORIDE 0.9 % IV SOLN: INTRAVENOUS | Status: DC

## 2022-01-14 MED ORDER — MORPHINE SULFATE (PF) 4 MG/ML IV SOLN
INTRAVENOUS | Status: AC
  Filled 2022-01-14: qty 1

## 2022-01-14 MED ORDER — TRANEXAMIC ACID-NACL 1000-0.7 MG/100ML-% IV SOLN
INTRAVENOUS | Status: AC
  Filled 2022-01-14: qty 100

## 2022-01-14 MED ORDER — PROPOFOL 500 MG/50ML IV EMUL
INTRAVENOUS | Status: DC | PRN
  Administered 2022-01-14: 60 ug/kg/min via INTRAVENOUS

## 2022-01-14 MED ORDER — ACETAMINOPHEN 500 MG PO TABS
1000.0000 mg | ORAL_TABLET | Freq: Four times a day (QID) | ORAL | Status: AC
  Administered 2022-01-14 – 2022-01-15 (×4): 1000 mg via ORAL
  Filled 2022-01-14 (×4): qty 2

## 2022-01-14 MED ORDER — ACETAMINOPHEN 325 MG PO TABS
325.0000 mg | ORAL_TABLET | Freq: Four times a day (QID) | ORAL | Status: DC | PRN
  Administered 2022-01-16: 650 mg via ORAL
  Filled 2022-01-14: qty 2

## 2022-01-14 MED ORDER — ONDANSETRON HCL 4 MG PO TABS: 4.0000 mg | ORAL_TABLET | Freq: Four times a day (QID) | ORAL | Status: DC | PRN

## 2022-01-14 MED ORDER — BRIMONIDINE TARTRATE 0.2 % OP SOLN
1.0000 [drp] | Freq: Two times a day (BID) | OPHTHALMIC | Status: DC
  Administered 2022-01-15 – 2022-01-20 (×11): 1 [drp] via OPHTHALMIC
  Filled 2022-01-14: qty 5

## 2022-01-14 MED ORDER — NEOMYCIN-POLYMYXIN B GU 40-200000 IR SOLN
Status: AC
  Filled 2022-01-14: qty 20

## 2022-01-14 MED ORDER — FLEET ENEMA 7-19 GM/118ML RE ENEM
1.0000 | ENEMA | Freq: Once | RECTAL | Status: AC | PRN
  Administered 2022-01-19: 1 via RECTAL

## 2022-01-14 SURGICAL SUPPLY — 74 items
BLADE SAW 90X13X1.19 OSCILLAT (BLADE) ×1 IMPLANT
BLADE SAW 90X25X1.19 OSCILLAT (BLADE) ×1 IMPLANT
CEMENT HV SMART SET (Cement) ×2 IMPLANT
CEMENT TIBIA MBT (Knees) IMPLANT
CNTNR SPEC 2.5X3XGRAD LEK (MISCELLANEOUS) ×1
CONT SPEC 4OZ STER OR WHT (MISCELLANEOUS) ×1
CONT SPEC 4OZ STRL OR WHT (MISCELLANEOUS) ×1
CONTAINER SPEC 2.5X3XGRAD LEK (MISCELLANEOUS) ×1 IMPLANT
COOLER POLAR GLACIER W/PUMP (MISCELLANEOUS) ×1 IMPLANT
CUFF TOURN SGL QUICK 24 (TOURNIQUET CUFF)
CUFF TOURN SGL QUICK 34 (TOURNIQUET CUFF)
CUFF TRNQT CYL 24X4X16.5-23 (TOURNIQUET CUFF) IMPLANT
CUFF TRNQT CYL 34X4.125X (TOURNIQUET CUFF) IMPLANT
DRAPE 3/4 80X56 (DRAPES) ×2 IMPLANT
DRAPE IMP U-DRAPE 54X76 (DRAPES) ×2 IMPLANT
DRAPE INCISE IOBAN 66X60 STRL (DRAPES) ×1 IMPLANT
DRAPE SURG 17X11 SM STRL (DRAPES) ×2 IMPLANT
DRSG AQUACEL AG ADV 3.5X10 (GAUZE/BANDAGES/DRESSINGS) ×1 IMPLANT
DRSG MEPILEX SACRM 8.7X9.8 (GAUZE/BANDAGES/DRESSINGS) ×1 IMPLANT
DURAPREP 26ML APPLICATOR (WOUND CARE) ×4 IMPLANT
ELECT REM PT RETURN 9FT ADLT (ELECTROSURGICAL) ×1
ELECTRODE REM PT RTRN 9FT ADLT (ELECTROSURGICAL) ×1 IMPLANT
GAUZE SPONGE 4X4 12PLY STRL (GAUZE/BANDAGES/DRESSINGS) ×1 IMPLANT
GLOVE BIOGEL PI IND STRL 9 (GLOVE) ×2 IMPLANT
GLOVE BIOGEL PI ORTHO SZ9 (GLOVE) ×8 IMPLANT
GLOVE SURG SYN 7.5  E (GLOVE) ×1
GLOVE SURG SYN 7.5 E (GLOVE) ×1 IMPLANT
GLOVE SURG SYN 7.5 PF PI (GLOVE) ×1 IMPLANT
GLOVE SURG UNDER POLY LF SZ7.5 (GLOVE) ×1 IMPLANT
GOWN STRL REUS TWL 2XL XL LVL4 (GOWN DISPOSABLE) ×1 IMPLANT
GOWN STRL REUS W/ TWL LRG LVL3 (GOWN DISPOSABLE) ×1 IMPLANT
GOWN STRL REUS W/ TWL LRG LVL4 (GOWN DISPOSABLE) ×1 IMPLANT
GOWN STRL REUS W/ TWL XL LVL3 (GOWN DISPOSABLE) ×1 IMPLANT
GOWN STRL REUS W/TWL 2XL LVL3 (GOWN DISPOSABLE) ×1 IMPLANT
GOWN STRL REUS W/TWL LRG LVL3 (GOWN DISPOSABLE) ×1
GOWN STRL REUS W/TWL LRG LVL4 (GOWN DISPOSABLE) ×1
GOWN STRL REUS W/TWL XL LVL3 (GOWN DISPOSABLE) ×1
HOLDER FOLEY CATH W/STRAP (MISCELLANEOUS) ×1 IMPLANT
IMMBOLIZER KNEE 19 BLUE UNIV (SOFTGOODS) ×1 IMPLANT
IMPL FEMUR SIGMA LT PS SZ 3 (Knees) IMPLANT
IMPLANT FEMUR SIGMA LT PS SZ 3 (Knees) ×1 IMPLANT
INSERT TIBIAL PFC SIG SZ3 10MM (Knees) IMPLANT
IV NS IRRIG 3000ML ARTHROMATIC (IV SOLUTION) ×1 IMPLANT
KIT TURNOVER KIT A (KITS) ×1 IMPLANT
MANIFOLD NEPTUNE II (INSTRUMENTS) ×2 IMPLANT
NDL SAFETY ECLIP 18X1.5 (MISCELLANEOUS) ×1 IMPLANT
NDL SPNL 20GX3.5 QUINCKE YW (NEEDLE) ×1 IMPLANT
NEEDLE HYPO 22GX1.5 SAFETY (NEEDLE) ×1 IMPLANT
NEEDLE SPNL 20GX3.5 QUINCKE YW (NEEDLE) ×1 IMPLANT
NS IRRIG 1000ML POUR BTL (IV SOLUTION) ×1 IMPLANT
PACK TOTAL KNEE (MISCELLANEOUS) ×1 IMPLANT
PAD ABD DERMACEA PRESS 5X9 (GAUZE/BANDAGES/DRESSINGS) ×2 IMPLANT
PAD WRAPON POLAR KNEE (MISCELLANEOUS) ×1 IMPLANT
PATELLA DOME PFC 35MM (Knees) IMPLANT
PENCIL SMOKE EVACUATOR COATED (MISCELLANEOUS) ×1 IMPLANT
PIN DRILL FIX HALF THREAD (BIT) ×2 IMPLANT
PIN FIXATION 1/8DIA X 3INL (PIN) ×2 IMPLANT
PULSAVAC PLUS IRRIG FAN TIP (DISPOSABLE) ×1
STAPLER SKIN PROX 35W (STAPLE) ×1 IMPLANT
SUCTION FRAZIER HANDLE 10FR (MISCELLANEOUS) ×1
SUCTION TUBE FRAZIER 10FR DISP (MISCELLANEOUS) ×1 IMPLANT
SUT ETHIBOND NAB CT1 #1 30IN (SUTURE) ×2 IMPLANT
SUT VIC AB 0 CT1 36 (SUTURE) ×1 IMPLANT
SUT VIC AB 2-0 CT1 (SUTURE) ×2 IMPLANT
SYR 20ML LL LF (SYRINGE) ×1 IMPLANT
SYR 30ML LL (SYRINGE) ×2 IMPLANT
TIBIA MBT CEMENT (Knees) ×1 IMPLANT
TIP FAN IRRIG PULSAVAC PLUS (DISPOSABLE) ×1 IMPLANT
TOWER CARTRIDGE SMART MIX (DISPOSABLE) ×1 IMPLANT
TRAP FLUID SMOKE EVACUATOR (MISCELLANEOUS) ×1 IMPLANT
TRAY FOLEY MTR SLVR 16FR STAT (SET/KITS/TRAYS/PACK) ×1 IMPLANT
TUBE SUCT KAM VAC (TUBING) ×1 IMPLANT
WATER STERILE IRR 500ML POUR (IV SOLUTION) ×1 IMPLANT
WRAPON POLAR PAD KNEE (MISCELLANEOUS) ×1

## 2022-01-14 NOTE — H&P (Signed)
PREOPERATIVE H&P  Chief Complaint: Left Knee Osteoarthritis  HPI: Sheryl Miller is a 86 y.o. female who presents for preoperative history and physical with a diagnosis of Left Knee Osteoarthritis. Her left knee pain is significantly impairing activities of daily living.  Patient has failed nonoperative management wished to proceed with a left total knee arthroplasty.  Patient's x-rays demonstrate tricompartmental osteoarthritis including joint space narrowing, subchondral sclerosis and marginal osteophytes.   Past Medical History:  Diagnosis Date   Allergic rhinitis, cause unspecified    Breast cyst    Diverticulosis of colon (without mention of hemorrhage)    Esophageal reflux    Female stress incontinence    Internal hemorrhoids without mention of complication    Osteoporosis, unspecified    Other and unspecified hyperlipidemia    Raynaud's syndrome    Rheumatic fever    Unspecified constipation    Past Surgical History:  Procedure Laterality Date   ABDOMINAL HYSTERECTOMY     BLADDER REPAIR     BREAST BIOPSY Right 2000   fibrocystic disease   KNEE ARTHROSCOPY Right ~5/16   ROTATOR CUFF REPAIR  2006   SHOULDER ARTHROSCOPY WITH ROTATOR CUFF REPAIR AND SUBACROMIAL DECOMPRESSION  2003   R, Scott Dean   TONSILLECTOMY     TONSILLECTOMY     TUBAL LIGATION     Social History   Socioeconomic History   Marital status: Married    Spouse name: Charles   Number of children: 4   Years of education: Not on file   Highest education level: Not on file  Occupational History   Occupation: retired Scientist, clinical (histocompatibility and immunogenetics): RETIRED  Tobacco Use   Smoking status: Never   Smokeless tobacco: Never  Vaping Use   Vaping Use: Never used  Substance and Sexual Activity   Alcohol use: Yes    Comment: wine of sunday   Drug use: No   Sexual activity: Yes    Birth control/protection: Surgical  Other Topics Concern   Not on file  Social History Narrative   Regular exercise-yes, walking or  rides stationery bike daily      Has living will.    Husband should make health care decisions as needed, then son Herbie [place].    Would accept resuscitation but no prolonged ventilation.    Would not want feeding tube for extended time if not cognitively aware            Social Determinants of Health   Financial Resource Strain: Not on file  Food Insecurity: Not on file  Transportation Needs: Not on file  Physical Activity: Not on file  Stress: Not on file  Social Connections: Not on file   Family History  Problem Relation Age of Onset   Coronary artery disease Mother    Heart disease Mother    Diabetes Mother    COPD Father    Breast cancer Sister        2 (age 89?, age 17?); "Gene pos"   Diabetes Brother    Breast cancer Maternal Aunt        2   Stroke Paternal Grandmother    Diabetes Brother    Breast cancer Sister    Breast cancer Daughter        ? at age 93   Breast cancer Maternal Aunt    Breast cancer Niece    Breast cancer Niece    No Known Allergies Prior to Admission medications   Medication Sig Start Date End Date  Taking? Authorizing Provider  Ascorbic Acid (VITAMIN C) 1000 MG tablet Take 1,000 mg by mouth daily.   Yes [provider]  brimonidine (ALPHAGAN) 0.2 % ophthalmic solution Place 1 drop into both eyes 2 (two) times daily. 08/03/19  Yes [provider]  diclofenac Sodium (VOLTAREN) 1 % GEL Apply 2 g topically daily as needed.   Yes [provider]  fexofenadine (ALLEGRA) 180 MG tablet Take 180 mg by mouth daily.   Yes [provider]  gabapentin (NEURONTIN) 100 MG capsule Take 100 mg by mouth daily as needed.   Yes [provider]  ketoconazole (NIZORAL) 2 % shampoo MASSAGE INTO SCALP EVERY OTHER DAY, LET SIT SEVERAL MINUTES BEFORE RINSING. 09/30/21  Yes Brendolyn Patty, MD  melatonin 5 MG TABS Take 5 mg by mouth at bedtime.   Yes [provider]  mometasone (ELOCON) 0.1 % lotion APPLY TO AFFECTED AREAS  OF SCALP TWICE DAILY UNTIL IMPROVED AND AS NEEDED FOR RECURRENCE. 01/07/22  Yes Ralene Bathe, MD  Multiple Vitamins-Minerals (CENTRUM SILVER ULTRA WOMENS) TABS Take 1 tablet by mouth daily.    Yes [provider]  Probiotic Product (PROBIOTIC-10 PO) Take 1 capsule by mouth daily.   Yes [provider]  simvastatin (ZOCOR) 40 MG tablet TAKE 1 TABLET AT BEDTIME 04/22/21  Yes Viviana Simpler I, MD  timolol (TIMOPTIC) 0.5 % ophthalmic solution Place 1 drop into both eyes 2 (two) times daily. 07/26/19  Yes [provider]  traZODone (DESYREL) 150 MG tablet TAKE 1 TABLET AT BEDTIME AS NEEDED  FOR  SLEEP 03/18/21  Yes Viviana Simpler I, MD  TURMERIC PO Take 500 mg by mouth daily.   Yes [provider]  Ubiquinone (ULTRA COQ10 PO) Take 1 capsule by mouth daily.   Yes [provider]  vitamin D3 (CHOLECALCIFEROL) 25 MCG tablet Take 1,000 Units by mouth daily.   Yes [provider]     Positive ROS: All other systems have been reviewed and were otherwise negative with the exception of those mentioned in the HPI and as above.  Physical Exam: General: Alert, no acute distress Cardiovascular: Regular rate and rhythm, no murmurs rubs or gallops.  No pedal edema Respiratory: Clear to auscultation bilaterally, no wheezes rales or rhonchi. No cyanosis, no use of accessory musculature GI: No organomegaly, abdomen is soft and non-tender nondistended with positive bowel sounds. Skin: Skin intact, no lesions within the operative field. Neurologic: Sensation intact distally Psychiatric: Patient is competent for consent with normal mood and affect Lymphatic: No cervical lymphadenopathy  MUSCULOSKELETAL: Left knee: Patient skin is intact.  There is no erythema.  She has 2 areas of ecchymosis over the lateral left leg and 1 over the lateral left hip without skin breakdown.  Patient has no swelling of the thigh or leg.  Distally she is neurovascular intact.  Range  of motion is from 0 - 110-120 degrees.  Assessment: Left Knee Osteoarthritis  Plan: Plan for Procedure(s): LEFT TOTAL KNEE ARTHROPLASTY  I reviewed the details of the operation as well as the postoperative course with the patient and her husband.  Patient will be admitted postoperatively for pain control, neurovascular monitoring and 24 hours of postop antibiotics.  I discussed the risks and benefits of surgery. The risks include but are not limited to infection requiring the removal of the prosthesis, bleeding requiring blood transfusion, nerve or blood vessel injury, joint stiffness or loss of motion, persistent pain, weakness or instability, hardware failure or loosening and the  need for further surgery. Medical risks include but are not limited to DVT and pulmonary embolism, myocardial infarction, stroke, pneumonia, respiratory failure and death. Patient understood these risks and wished to proceed.     Thornton Park, MD   01/14/2022 10:55 AM

## 2022-01-14 NOTE — Anesthesia Preprocedure Evaluation (Signed)
Anesthesia Evaluation  Patient identified by MRN, date of birth, ID band Patient awake    Reviewed: Allergy & Precautions, NPO status , Patient's Chart, lab work & pertinent test results  History of Anesthesia Complications Negative for: history of anesthetic complications  Airway Mallampati: III  TM Distance: <3 FB Neck ROM: full    Dental  (+) Chipped, Poor Dentition, Missing, Upper Dentures   Pulmonary neg pulmonary ROS, neg shortness of breath   Pulmonary exam normal        Cardiovascular Exercise Tolerance: Good (-) angina (-) Past MI negative cardio ROS Normal cardiovascular exam     Neuro/Psych negative neurological ROS  negative psych ROS   GI/Hepatic Neg liver ROS,GERD  Controlled,,  Endo/Other  negative endocrine ROS    Renal/GU      Musculoskeletal   Abdominal   Peds  Hematology negative hematology ROS (+)   Anesthesia Other Findings Past Medical History: No date: Allergic rhinitis, cause unspecified No date: Breast cyst No date: Diverticulosis of colon (without mention of hemorrhage) No date: Esophageal reflux No date: Female stress incontinence No date: Internal hemorrhoids without mention of complication No date: Osteoporosis, unspecified No date: Other and unspecified hyperlipidemia No date: Raynaud's syndrome No date: Rheumatic fever No date: Unspecified constipation  Past Surgical History: No date: ABDOMINAL HYSTERECTOMY No date: BLADDER REPAIR 2000: BREAST BIOPSY; Right     Comment:  fibrocystic disease ~5/16: KNEE ARTHROSCOPY; Right 2006: ROTATOR CUFF REPAIR 2003: SHOULDER ARTHROSCOPY WITH ROTATOR CUFF REPAIR AND SUBACROMIAL  DECOMPRESSION     Comment:  R, Scott Dean No date: TONSILLECTOMY No date: TONSILLECTOMY No date: TUBAL LIGATION  BMI    Body Mass Index: 28.71 kg/m      Reproductive/Obstetrics negative OB ROS                              Anesthesia Physical Anesthesia Plan  ASA: 3  Anesthesia Plan: Spinal   Post-op Pain Management:    Induction:   PONV Risk Score and Plan:   Airway Management Planned: Natural Airway and Nasal Cannula  Additional Equipment:   Intra-op Plan:   Post-operative Plan:   Informed Consent: I have reviewed the patients History and Physical, chart, labs and discussed the procedure including the risks, benefits and alternatives for the proposed anesthesia with the patient or authorized representative who has indicated his/her understanding and acceptance.     Dental Advisory Given  Plan Discussed with: Anesthesiologist, CRNA and Surgeon  Anesthesia Plan Comments: (Patient reports no bleeding problems and no anticoagulant use.  Plan for spinal with backup GA  Patient consented for risks of anesthesia including but not limited to:  - adverse reactions to medications - damage to eyes, teeth, lips or other oral mucosa - nerve damage due to positioning  - risk of bleeding, infection and or nerve damage from spinal that could lead to paralysis - risk of headache or failed spinal - damage to teeth, lips or other oral mucosa - sore throat or hoarseness - damage to heart, brain, nerves, lungs, other parts of body or loss of life  Patient voiced understanding.)       Anesthesia Quick Evaluation

## 2022-01-14 NOTE — Evaluation (Signed)
Physical Therapy Evaluation Patient Details Name: Sheryl Miller MRN: [id number] DOB: 12/19/1935 Today's Date: 01/14/2022  History of Present Illness  86 y/o female s/p L TKA 11/7.  Clinical Impression  Pt did very well with POD0 PT session.  She was able to perform SLRs, ambulate ~50 ft, had 0-83* ROM and generally did quite well, not needing physical assist with bed mobility of getting to standing from standard height bed.  Pt at or exceeding typical POD0 expectations, will continue to PT to further facilitate post-op protocol, mobility, strength, ROM and gait.       Recommendations for follow up therapy are one component of a multi-disciplinary discharge planning process, led by the attending physician.  Recommendations may be updated based on patient status, additional functional criteria and insurance authorization.  Follow Up Recommendations Follow physician's recommendations for discharge plan and follow up therapies      Assistance Recommended at Discharge Intermittent Supervision/Assistance  Patient can return home with the following  A little help with bathing/dressing/bathroom;Assistance with cooking/housework;Assist for transportation;Help with stairs or ramp for entrance    Equipment Recommendations None recommended by PT  Recommendations for Other Services  Rehab consult    Functional Status Assessment Patient has had a recent decline in their functional status and demonstrates the ability to make significant improvements in function in a reasonable and predictable amount of time.     Precautions / Restrictions Precautions Required Braces or Orthoses: Knee Immobilizer - Left Knee Immobilizer - Left:  (for knee ext ROM in bed) Restrictions Weight Bearing Restrictions: Yes LLE Weight Bearing: Weight bearing as tolerated      Mobility  Bed Mobility Overal bed mobility: Modified Independent             General bed mobility comments: Pt slow to get to  sitting but did not require assist    Transfers Overall transfer level: Needs assistance Equipment used: Rolling walker (2 wheels) Transfers: Sit to/from Stand Sit to Stand: Min guard           General transfer comment: plenty of cuing for set up, sequencing, pt needed some extra time and effort but did not require direct assist from standard height bed    Ambulation/Gait Ambulation/Gait assistance: Min guard Gait Distance (Feet): 50 Feet Assistive device: Rolling walker (2 wheels)         General Gait Details: Pt with good WBing tolerance, appropriate UE reliance on the walker and no buckling, LOBs or other overt safety concerns.  Stairs            Wheelchair Mobility    Modified Rankin (Stroke Patients Only)       Balance Overall balance assessment: Modified Independent                                           Pertinent Vitals/Pain Pain Assessment Pain Assessment: 0-10 Pain Score: 6  Pain Location: L knee    Home Living Family/patient expects to be discharged to:: Private residence Living Arrangements: Spouse/significant other Available Help at Discharge: Family;Available 24 hours/day   Home Access: Stairs to enter Entrance Stairs-Rails: Right;Left (too wide) Entrance Stairs-Number of Steps: 3     Home Equipment: Rolling Walker (2 wheels);Cane - single point;BSC/3in1      Prior Function Prior Level of Function : Independent/Modified Independent  Mobility Comments: pt has been using SPC recently, able to be active, drive, etc       Hand Dominance        Extremity/Trunk Assessment   Upper Extremity Assessment Upper Extremity Assessment: Overall WFL for tasks assessed    Lower Extremity Assessment Lower Extremity Assessment: Overall WFL for tasks assessed (expected post-op weakness, but able to AROM L SLR after warm up reps)       Communication   Communication: No difficulties  Cognition  Arousal/Alertness: Awake/alert Behavior During Therapy: WFL for tasks assessed/performed Overall Cognitive Status: Within Functional Limits for tasks assessed                                          General Comments      Exercises Total Joint Exercises Ankle Circles/Pumps: AROM, 10 reps Quad Sets: Strengthening, 10 reps Short Arc Quad: AROM, 10 reps Heel Slides: AROM, 5 reps (with lightly resisted leg ext) Hip ABduction/ADduction: AROM, Strengthening, 5 reps Straight Leg Raises: AROM, 10 reps Knee Flexion: PROM, 5 reps Goniometric ROM: 0-83   Assessment/Plan    PT Assessment Patient needs continued PT services  PT Problem List Decreased strength;Decreased range of motion;Decreased activity tolerance;Decreased balance;Decreased mobility;Decreased knowledge of use of DME;Decreased safety awareness;Pain       PT Treatment Interventions DME instruction;Gait training;Stair training;Functional mobility training;Therapeutic activities;Therapeutic exercise;Balance training;Cognitive remediation;Patient/family education    PT Goals (Current goals can be found in the Care Plan section)  Acute Rehab PT Goals Patient Stated Goal: go home tomorrow PT Goal Formulation: With patient Time For Goal Achievement: 01/27/22 Potential to Achieve Goals: Good    Frequency BID     Co-evaluation               AM-PAC PT "6 Clicks" Mobility  Outcome Measure Help needed turning from your back to your side while in a flat bed without using bedrails?: None Help needed moving from lying on your back to sitting on the side of a flat bed without using bedrails?: None Help needed moving to and from a bed to a chair (including a wheelchair)?: A Little Help needed standing up from a chair using your arms (e.g., wheelchair or bedside chair)?: A Little Help needed to walk in hospital room?: A Little Help needed climbing 3-5 steps with a railing? : A Little 6 Click Score: 20     End of Session Equipment Utilized During Treatment: Gait belt Activity Tolerance: Patient tolerated treatment well Patient left: with call bell/phone within reach;in chair;with nursing/sitter in room Nurse Communication: Mobility status PT Visit Diagnosis: Muscle weakness (generalized) (M62.81);Difficulty in walking, not elsewhere classified (R26.2);Pain Pain - Right/Left: Left Pain - part of body: Knee    Time: 4010-2725 PT Time Calculation (min) (ACUTE ONLY): 55 min   Charges:   PT Evaluation $PT Eval Low Complexity: 1 Low PT Treatments $Gait Training: 8-22 mins $Therapeutic Exercise: 8-22 mins $Therapeutic Activity: 8-22 mins        Kreg Shropshire, DPT 01/14/2022, 5:52 PM

## 2022-01-14 NOTE — Anesthesia Procedure Notes (Signed)
Spinal  Patient location during procedure: OR Start time: 01/14/2022 11:05 AM End time: 01/14/2022 11:10 AM Reason for block: surgical anesthesia Staffing Performed: resident/CRNA  Anesthesiologist: Piscitello, Precious Haws, MD Resident/CRNA: Esaw Grandchild, CRNA Performed by: Esaw Grandchild, CRNA Authorized by: Andria Frames, MD   Preanesthetic Checklist Completed: patient identified, IV checked, site marked, risks and benefits discussed, surgical consent, monitors and equipment checked, pre-op evaluation and timeout performed Spinal Block Patient position: sitting Prep: ChloraPrep Patient monitoring: continuous pulse ox, blood pressure and heart rate Approach: midline Location: L3-4 Injection technique: single-shot Needle Needle type: Pencan  Needle gauge: 24 G Needle length: 9 cm Assessment Sensory level: T4 Events: CSF return

## 2022-01-14 NOTE — Transfer of Care (Signed)
Immediate Anesthesia Transfer of Care Note  Patient: Sheryl Miller  Procedure(s) Performed: TOTAL KNEE ARTHROPLASTY (Left: Knee)  Patient Location: PACU  Anesthesia Type:General and Spinal  Level of Consciousness: drowsy  Airway & Oxygen Therapy: Patient Spontanous Breathing and Patient connected to face mask oxygen  Post-op Assessment: Report given to RN and Post -op Vital signs reviewed and stable  Post vital signs: Reviewed and stable  Last Vitals:  Vitals Value Taken Time  BP    Temp    Pulse    Resp    SpO2      Last Pain:  Vitals:   01/14/22 1003  TempSrc: Oral  PainSc: 0-No pain      Patients Stated Pain Goal: 0 (09/27/80 8833)  Complications: No notable events documented.

## 2022-01-14 NOTE — Plan of Care (Signed)

## 2022-01-14 NOTE — Op Note (Signed)
DATE OF SURGERY:  01/14/2022 TIME: 2:05 PM  PATIENT NAME:  Edwena Felty A Orona   AGE: 86 y.o.    PRE-OPERATIVE DIAGNOSIS:  Left Knee Osteoarthritis  POST-OPERATIVE DIAGNOSIS:  Same  PROCEDURE:  Procedure(s): LEFT TOTAL KNEE ARTHROPLASTY  SURGEON:  Thornton Park, MD   ASSISTANT:  Roland Rack, PA  OPERATIVE IMPLANTS: Depuy PFC Sigma, Posterior Stabilized Femural component size 3, Tibia size rotating platform component size 3, Patella polyethylene 3-peg oval button size 35, with a 10 mm polyethylene insert.  EBL:  50  TOURNIQUET TIME:  77 minutes  PREOPERATIVE INDICATIONS:  Riane Rung Kosiba is an 86 y.o. female who has a diagnosis of  Left Knee Osteoarthritis and elected for a left total knee arthroplasty after failing nonoperative treatment.  Their knee pain significantly impacts their activity of daily living.  Radiographs have demonstrated tricompartmental osteoarthritis joint space narrowing, osteophytes and subchondral sclerosis.  The risks, benefits, and alternatives were discussed at length including but not limited to the risks of infection, bleeding, nerve or blood vessel injury, knee stiffness, fracture, dislocation, loosening or failure of the hardware and the need for further surgery. Medical risks include but not limited to DVT and pulmonary embolism, myocardial infarction, stroke, pneumonia, respiratory failure and death. I discussed these risks with the patient in my office prior to the date of surgery. They understood these risks and were willing to proceed.  OPERATIVE FINDINGS AND UNIQUE ASPECTS OF THE CASE:  tricompartmental osteoarthritis with significant subchondral sclerosis within the medial compartment  OPERATIVE DESCRIPTION:  The patient was brought to the operative room and placed in a supine position after undergoing placement of a spinal anesthetic.  A Foley catheter was placed.  IV antibiotics were given. Patient received 2 g IV Ancef and tranexamic acid  1000 mg IV prior to the inflation of the tourniquet. The lower extremity was prepped and draped in the usual sterile fashion.  A time out was performed to verify the patient's name, date of birth, medical record number, correct site of surgery and correct procedure to be performed. The timeout was also used to confirm the patient received antibiotics and that appropriate instruments, implants and radiographs studies were available in the room.  The leg was elevated and exsanguinated with an Esmarch and the tourniquet was inflated to 275 mmHg for 77 minutes..  A midline incision was made over the left knee. Full-thickness skin flaps were developed. A medial parapatellar arthrotomy was then made and the patella everted and the knee was brought into 90 of flexion. Hoffa's fat pad along with the cruciate ligaments and medial and lateral menisci were resected.   The distal femoral intramedullary canal was opened with a drill and the intramedullary distal femoral cutting jig was inserted into the femoral canal pinned into position. It was set at 5 degrees resecting 10 mm off the distal femur.  Care was taken to protect the collateral ligaments during distal femoral resection.  The distal femoral resection was performed with an oscillating saw. The femoral cutting guide was then removed.  The extramedullary tibial cutting guide was then placed using the anterior tibial crest and second ray of the foot as a references.  The tibial cutting guide was adjusted to allow for appropriate posterior slope.  The tibial cutting block was pinned into position. The slotted stylus was used to measure the proximal tibial resection of 10 mm off the high lateral side.  The tibial long rod alignment guide was then used to confirm position of the  cutting block. A third cross pin through the tibial cutting block was then drilled into position to allow for rotational stability. Care was taken during the tibial resection to protect the  medial and collateral ligaments.  The resected tibial bone was removed along with the posterior horns of the menisci.  The PCL was sacrificed.  Extension gap was measured with a spacer block and alignment and extension was confirmed using a long alignment rod.  The attention was then turned back to the femur. The posterior referencing distal femoral sizing guide was applied to the distal femur.  The femur was sized to be a size 3. Rotation of the referencing guide was checked with the epicondylar axis and Whitesides line. Then the 4-in-1 cutting jig was then applied to the distal femur. A stylus was used to confirm that the anterior femur would not be notched.   Then the anterior, posterior and chamfer femoral cuts were then made with an oscillating saw.  The flexion gap was then measured with a flexion spacer block and long alignment rod and was found to be symmetric with the extension gap and perpendicular to mechanical axis of the tibia.  The distal femoral preparation was completed by performing the posterior stabilized box cut using the cutting block. The entry site for the intramedullary femoral guide was filled with autologous bone graft from bone previously resected earlier in the case.  The proximal tibia plateau was then sized with trial trays. The best coverage was achieved with a size 3. This tibial tray was then pinned into position. The proximal tibia was then prepared with the reamer and keel punch.  After tibial preparation was completed, all trial components were inserted with polyethylene trials.  The knee was found to have excellent balance and full motion with a size 10 mm tibial polyethylene insert..    The attention was then turned to preparation of the patella. The thickness of the patella was measured with a caliper, the diameter measured with the patella templates.  The patella resection was then made with an oscillating saw using the patella cutting guide.  The final patellar  diameter was 38 mm.  3 peg holes for the patella component were then drilled. The trial patella was then placed. Knee was taken through a full range of motion and deemed to be stable with the trial components. All trial components were then removed. The knee capsule was then injected with Exparel. The joint was copiously irrigated with pulse lavage.  The final total knee arthroplasty components were then cemented into place with a 10 mm trial polyethylene insert and all excess methylmethacrylate was removed.  The joint was again copiously irrigated. After the cement had hardened the knee was again taken through a full range of motion. It was felt to be most stable with the 10 mm tibial polyethylene insert. The actual tibial polyethylene insert was then placed.   The knee was taken through a range of motion and the patella tracked well and the knee was again irrigated copiously.    The medial arthrotomy was closed with #1 Ethibond. The subcutaneous tissue closed with 0 and 2-0 vicryl, and skin approximated with staples.  A dry sterile and compressive dressing was applied.  A Polar Care was applied to the operative knee along with a knee immobilizer.  The patient was awakened and brought to the PACU in stable and satisfactory condition.  All sharp, lap and instrument counts were correct at the conclusion the case. I spoke with  the patient's husband in the postop consultation room to let him know the case had been performed without complication and the patient was stable in recovery room.

## 2022-01-14 NOTE — Progress Notes (Signed)
Patient is doing well, notified husband that she is doing great and going to room 157 on the first floor.

## 2022-01-15 ENCOUNTER — Encounter: Payer: Self-pay | Admitting: Orthopedic Surgery

## 2022-01-15 DIAGNOSIS — E785 Hyperlipidemia, unspecified: Secondary | ICD-10-CM | POA: Diagnosis present

## 2022-01-15 DIAGNOSIS — Z96652 Presence of left artificial knee joint: Secondary | ICD-10-CM

## 2022-01-15 DIAGNOSIS — M810 Age-related osteoporosis without current pathological fracture: Secondary | ICD-10-CM | POA: Diagnosis present

## 2022-01-15 DIAGNOSIS — E876 Hypokalemia: Secondary | ICD-10-CM | POA: Diagnosis not present

## 2022-01-15 DIAGNOSIS — D649 Anemia, unspecified: Secondary | ICD-10-CM | POA: Diagnosis present

## 2022-01-15 LAB — BASIC METABOLIC PANEL
Anion gap: 0 — ABNORMAL LOW (ref 5–15)
BUN: 13 mg/dL (ref 8–23)
CO2: 22 mmol/L (ref 22–32)
Calcium: 6.4 mg/dL — CL (ref 8.9–10.3)
Chloride: 115 mmol/L — ABNORMAL HIGH (ref 98–111)
Creatinine, Ser: 0.44 mg/dL (ref 0.44–1.00)
GFR, Estimated: >60 mL/min (ref >60)
Glucose, Bld: 92 mg/dL (ref 70–99)
Potassium: 3.3 mmol/L — ABNORMAL LOW (ref 3.5–5.1)
Sodium: 137 mmol/L (ref 135–145)

## 2022-01-15 LAB — CBC
HCT: 28.2 % — ABNORMAL LOW (ref 36.0–46.0)
Hemoglobin: 9.6 g/dL — ABNORMAL LOW (ref 12.0–15.0)
MCH: 30.8 pg (ref 26.0–34.0)
MCHC: 34 g/dL (ref 30.0–36.0)
MCV: 90.4 fL (ref 80.0–100.0)
Platelets: 190 10*3/uL (ref 150–400)
RBC: 3.12 MIL/uL — ABNORMAL LOW (ref 3.87–5.11)
RDW: 12.9 % (ref 11.5–15.5)
WBC: 6.5 10*3/uL (ref 4.0–10.5)
nRBC: 0 % (ref 0.0–0.2)

## 2022-01-15 LAB — PHOSPHORUS: Phosphorus: 3.4 mg/dL (ref 2.5–4.6)

## 2022-01-15 LAB — MAGNESIUM: Magnesium: 1.9 mg/dL (ref 1.7–2.4)

## 2022-01-15 MED ORDER — CALCIUM GLUCONATE-NACL 2-0.675 GM/100ML-% IV SOLN
2.0000 g | Freq: Once | INTRAVENOUS | Status: AC
  Administered 2022-01-15: 2000 mg via INTRAVENOUS
  Filled 2022-01-15: qty 100

## 2022-01-15 MED ORDER — VITAMIN D 25 MCG (1000 UNIT) PO TABS
1000.0000 [IU] | ORAL_TABLET | Freq: Every day | ORAL | Status: DC
  Administered 2022-01-15 – 2022-01-20 (×6): 1000 [IU] via ORAL
  Filled 2022-01-15 (×6): qty 1

## 2022-01-15 MED ORDER — TIMOLOL MALEATE 0.5 % OP SOLN
1.0000 [drp] | Freq: Two times a day (BID) | OPHTHALMIC | Status: DC
  Administered 2022-01-15 – 2022-01-20 (×10): 1 [drp] via OPHTHALMIC
  Filled 2022-01-15: qty 5

## 2022-01-15 MED ORDER — POTASSIUM CHLORIDE CRYS ER 20 MEQ PO TBCR
60.0000 meq | EXTENDED_RELEASE_TABLET | Freq: Once | ORAL | Status: AC
  Administered 2022-01-15: 60 meq via ORAL
  Filled 2022-01-15: qty 3

## 2022-01-15 MED ORDER — CALCIUM CARBONATE 1250 (500 CA) MG PO TABS
1250.0000 mg | ORAL_TABLET | Freq: Three times a day (TID) | ORAL | Status: DC
  Administered 2022-01-15 – 2022-01-20 (×16): 1250 mg via ORAL
  Filled 2022-01-15 (×17): qty 1

## 2022-01-15 NOTE — Progress Notes (Signed)
Physical Therapy Treatment Patient Details Name: Sheryl Miller MRN: [id number] DOB: Mar 04, 1936 Today's Date: 01/15/2022   History of Present Illness 86 y/o female s/p L TKA 11/7.    PT Comments    Pt reports minimal (2/10) pain on arrival but with any movement or activity quickly feeling exquisite pain. She also had issues with nausea, dizziness and generally not feeling well.  She was able to struggle through typical POD1 exercises but needed more AAROM, rest breaks and general encouragement today than on the eval last night.  Pt was able to ambulate ~100 ft with progressively improved speed and cadence but she started out very slow, guarded and with hesitant cadence.  Pt achieved 80 deg AROM yesterday, today unable to attain TKE and could not breach 80* flexion even with multiple reps of progressive gentle ROM tasks.   Recommendations for follow up therapy are one component of a multi-disciplinary discharge planning process, led by the attending physician.  Recommendations may be updated based on patient status, additional functional criteria and insurance authorization.  Follow Up Recommendations  Follow physician's recommendations for discharge plan and follow up therapies     Assistance Recommended at Discharge Intermittent Supervision/Assistance  Patient can return home with the following A little help with bathing/dressing/bathroom;Assistance with cooking/housework;Assist for transportation;Help with stairs or ramp for entrance;A little help with walking and/or transfers   Equipment Recommendations  None recommended by PT    Recommendations for Other Services       Precautions / Restrictions Precautions Precautions: None Required Braces or Orthoses: Knee Immobilizer - Left Knee Immobilizer - Left:  (in bed) Restrictions Weight Bearing Restrictions: Yes LLE Weight Bearing: Weight bearing as tolerated     Mobility  Bed Mobility Overal bed mobility: Modified  Independent             General bed mobility comments: Pt slow to get to sitting; struggled to initiate L LE movement toward EOB - slower and more labored than yesterday POD0 effort    Transfers Overall transfer level: Needs assistance Equipment used: Rolling walker (2 wheels) Transfers: Sit to/from Stand Sit to Stand: Min assist, Mod assist, From elevated surface           General transfer comment: Pt struggled to rise from standard height bed, raised bed and still unable to rise w/o assist.  Plenty of cuing and set up for UE use on raised rails and LE set up but she could not rise w/o assist from 2" raised bed.  Ultimatley needed 3" raised bed and min/mod assist to attain standing    Ambulation/Gait Ambulation/Gait assistance: Min assist Gait Distance (Feet): 100 Feet Assistive device: Rolling walker (2 wheels)         General Gait Details: Pt much more hesitant than on the eval with WBing and ambulation.  No buckling or LOBs but clearly needing more UE support and deliberate effort to extend knee fully in [place].  Pt did show improved speed and tolerance with increased effort and did do more overall distance but not moving as confidently or consistently as yesterday.   Stairs             Wheelchair Mobility    Modified Rankin (Stroke Patients Only)       Balance Overall balance assessment: Needs assistance Sitting-balance support: No upper extremity supported Sitting balance-Leahy Scale: Good       Standing balance-Leahy Scale: Fair Standing balance comment: increased UE reliance, lacking L TKE during stance phase  Cognition Arousal/Alertness: Awake/alert Behavior During Therapy: WFL for tasks assessed/performed Overall Cognitive Status: Within Functional Limits for tasks assessed                                          Exercises Total Joint Exercises Ankle Circles/Pumps: AROM, 10 reps Quad  Sets: Strengthening, 10 reps Short Arc Quad: AROM, 10 reps Heel Slides: AROM, 10 reps (with gently resisted leg ext) Hip ABduction/ADduction: AAROM, AROM, 10 reps Straight Leg Raises: AAROM, AROM, 10 reps (pt struggled with SLRs this AM, needed AAROM for first 7 reps, final 3 AROM with great effort) Knee Flexion: PROM, 5 reps Goniometric ROM: 3-79    General Comments General comments (skin integrity, edema, etc.): Pt's BP appropriate, but pt with c/o nausea (and some mild dizziness) during ambulation effort.  Did have a few dry heaves.      Pertinent Vitals/Pain Pain Assessment Pain Assessment: 0-10 Pain Score: 2  (reports 2/10 pain at rest, significantly increased with any and all movement of L LE) Pain Location: L knee    Home Living                          Prior Function            PT Goals (current goals can now be found in the care plan section) Progress towards PT goals: Progressing toward goals    Frequency    BID      PT Plan Current plan remains appropriate    Co-evaluation              AM-PAC PT "6 Clicks" Mobility   Outcome Measure  Help needed turning from your back to your side while in a flat bed without using bedrails?: None Help needed moving from lying on your back to sitting on the side of a flat bed without using bedrails?: A Little Help needed moving to and from a bed to a chair (including a wheelchair)?: A Little Help needed standing up from a chair using your arms (e.g., wheelchair or bedside chair)?: A Lot Help needed to walk in hospital room?: A Little Help needed climbing 3-5 steps with a railing? : A Lot 6 Click Score: 17    End of Session Equipment Utilized During Treatment: Gait belt Activity Tolerance: Patient tolerated treatment well Patient left: with chair alarm set;with call bell/phone within reach Nurse Communication: Mobility status PT Visit Diagnosis: Muscle weakness (generalized) (M62.81);Difficulty in  walking, not elsewhere classified (R26.2);Pain Pain - Right/Left: Left Pain - part of body: Knee     Time: 0810-0910 PT Time Calculation (min) (ACUTE ONLY): 60 min  Charges:  $Gait Training: 8-22 mins $Therapeutic Exercise: 8-22 mins $Therapeutic Activity: 23-37 mins                     Kreg Shropshire, DPT 01/15/2022, 10:45 AM

## 2022-01-15 NOTE — Progress Notes (Signed)
The patient lives at home with her Husband She has a 3 in 1 and a rolling walker as well as a cane She already has [facility] set up and they were planning n coming out tomorrow, she let them know she is not at home yet She is struggling with mobility  TOC will continue to monitor in case needs change Her husband will provide transportation

## 2022-01-15 NOTE — Anesthesia Postprocedure Evaluation (Signed)
Anesthesia Post Note  Patient: Anivea A Encarnacion  Procedure(s) Performed: TOTAL KNEE ARTHROPLASTY (Left: Knee)  Patient location during evaluation: Nursing Unit Anesthesia Type: Spinal Level of consciousness: oriented and awake and alert Pain management: pain level controlled Vital Signs Assessment: post-procedure vital signs reviewed and stable Respiratory status: spontaneous breathing and respiratory function stable Cardiovascular status: blood pressure returned to baseline and stable Postop Assessment: no headache, no backache, no apparent nausea or vomiting and patient able to bend at knees Anesthetic complications: no  No notable events documented.   Last Vitals:  Vitals:   01/15/22 0434 01/15/22 0701  BP: (!) 121/50 (!) 121/54  Pulse: 66 68  Resp: 17 17  Temp:  36.7 C  SpO2: 93%     Last Pain:  Vitals:   01/15/22 0701  TempSrc: Oral  PainSc: 0-No pain                 Alison Stalling

## 2022-01-15 NOTE — Evaluation (Signed)
Occupational Therapy Evaluation Patient Details Name: Sheryl Miller MRN: [id number] DOB: Jul 13, 1935 Today's Date: 01/15/2022   History of Present Illness 86 y/o female s/p L TKA 11/7.   Clinical Impression   Patient presenting with decreased independence in self-care, functional mobility, safety, strength, and endurance. Patient reports she live sat home with her husband and will have 24/7 assistance at discharge. Patient reports she is mod I at baseline with ADL and IADLs and has been recently using SPC to ambulate. Equipment: SPC, RW, and BSC. Patient currently functioning at mod A for transfers sit<>stand using RW. VC for hand placement and foot placement to safely transfer. Patient ambulated ~6 ft to bed with min guard/ very close supervision due to patient with c/o nausea and dizziness. RN notified. BP and SP02 all WFL. Patient required min A for LE management to place patient back in supine. Patient educated on LB dressing techniques, polar car system, and wearing schedule for knee immobilizer. Hand out given and patient was able to verbalize understanding. Knee immboilizer donned at end of session max A. Patient left in bed with call bell inreach, bed alarm set, and all needs met.  Patient will benefit from acute OT to increase overall independence in the areas of ADLs, functional mobility, in order to safely discharge to the next venue of care. .       Recommendations for follow up therapy are one component of a multi-disciplinary discharge planning process, led by the attending physician.  Recommendations may be updated based on patient status, additional functional criteria and insurance authorization.   Follow Up Recommendations  Follow physician's recommendations for discharge plan and follow up therapies    Assistance Recommended at Discharge Intermittent Supervision/Assistance  Patient can return home with the following A little help with walking and/or transfers;A little help  with bathing/dressing/bathroom;Help with stairs or ramp for entrance;Assist for transportation;Assistance with cooking/housework    Functional Status Assessment  Patient has had a recent decline in their functional status and demonstrates the ability to make significant improvements in function in a reasonable and predictable amount of time.  Equipment Recommendations  None recommended by OT       Precautions / Restrictions Precautions Precautions: None Required Braces or Orthoses: Knee Immobilizer - Left Knee Immobilizer - Left:  (in bed) Restrictions Weight Bearing Restrictions: Yes LLE Weight Bearing: Weight bearing as tolerated      Mobility Bed Mobility Overal bed mobility: Needs Assistance Bed Mobility: Sit to Supine           General bed mobility comments: Patient in recliner upon arrival. Required min A for LE management back into bed.    Transfers Overall transfer level: Needs assistance Equipment used: Rolling walker (2 wheels) Transfers: Sit to/from Stand Sit to Stand: Mod assist           General transfer comment: Patient required mod A to stand from recliner (low surface), slow rising upon standing with c/o dizziness. VC for hand placementon RW and LE placement in order to stand from recliner. Fair standing balance observed      Balance Overall balance assessment: Needs assistance Sitting-balance support: No upper extremity supported Sitting balance-Leahy Scale: Good     Standing balance support: Bilateral upper extremity supported, Reliant on assistive device for balance Standing balance-Leahy Scale: Fair                             ADL either performed or assessed with  clinical judgement   ADL Overall ADL's : Needs assistance/impaired                     Lower Body Dressing: Minimal assistance Lower Body Dressing Details (indicate cue type and reason): Per patient report required min A for LB dressing tasks.              Functional mobility during ADLs: Rolling walker (2 wheels)        Pertinent Vitals/Pain Pain Assessment Pain Assessment: No/denies pain        Extremity/Trunk Assessment Upper Extremity Assessment Upper Extremity Assessment: Overall WFL for tasks assessed   Lower Extremity Assessment Lower Extremity Assessment: Overall WFL for tasks assessed;LLE deficits/detail       Communication Communication Communication: No difficulties   Cognition Arousal/Alertness: Awake/alert, Lethargic Behavior During Therapy: WFL for tasks assessed/performed Overall Cognitive Status: Within Functional Limits for tasks assessed                                 General Comments: patient reports she feel dizzy/nauseous due to medication.     General Comments  Pt's BP appropriate, but pt with c/o nausea (and some mild dizziness) during ambulation effort.  Did have a few dry heaves.            Home Living Family/patient expects to be discharged to:: Private residence Living Arrangements: Spouse/significant other Available Help at Discharge: Family;Available 24 hours/day Type of Home: House Home Access: Stairs to enter [employer] of Steps: 3 Entrance Stairs-Rails: Right;Left       Bathroom Shower/Tub: Tub/shower unit;Walk-in shower   Bathroom Toilet: Handicapped height     Home Equipment: Agricultural consultant (2 wheels);Cane - single point;BSC/3in1          Prior Functioning/Environment Prior Level of Function : Independent/Modified Independent             Mobility Comments: pt has been using SPC recently, able to be active, drive, etc ADLs Comments: Independent with ADls and IADLs        OT Problem List: Decreased strength;Impaired balance (sitting and/or standing);Decreased activity tolerance;Decreased safety awareness      OT Treatment/Interventions: Self-care/ADL training;Therapeutic activities;Therapeutic exercise    OT Goals(Current goals  can be found in the care plan section) Acute Rehab OT Goals Patient Stated Goal: get better OT Goal Formulation: With patient Time For Goal Achievement: 01/29/22 Potential to Achieve Goals: Good ADL Goals Pt Will Perform Lower Body Bathing: with modified independence Pt Will Perform Lower Body Dressing: with modified independence Pt Will Transfer to Toilet: with modified independence Pt Will Perform Toileting - Clothing Manipulation and hygiene: with modified independence  OT Frequency: Min 2X/week       AM-PAC OT "6 Clicks" Daily Activity     Outcome Measure Help from another person eating meals?: None Help from another person taking care of personal grooming?: A Little Help from another person toileting, which includes using toliet, bedpan, or urinal?: A Little Help from another person bathing (including washing, rinsing, drying)?: A Lot Help from another person to put on and taking off regular upper body clothing?: A Little Help from another person to put on and taking off regular lower body clothing?: A Little 6 Click Score: 18   End of Session Equipment Utilized During Treatment: Rolling walker (2 wheels) Nurse Communication: Mobility status;Other (comment) (Patient reporting she feels nausea and dizzy)  Activity Tolerance: Patient limited  by fatigue Patient left: in bed;with call bell/phone within reach;with bed alarm set;Other (comment)  OT Visit Diagnosis: Muscle weakness (generalized) (M62.81);Dizziness and giddiness (R42)                Time: 7846-9629 OT Time Calculation (min): 32 min Charges:  OT General Charges $OT Visit: 1 Visit OT Evaluation $OT Eval Low Complexity: 1 Low OT Treatments $Therapeutic Activity: 8-22 mins    Amry Cathy, OTS 01/15/2022, 11:09 AM

## 2022-01-15 NOTE — Progress Notes (Signed)
Physical Therapy Treatment Patient Details Name: Sheryl Miller MRN: [id number] DOB: 08-30-1935 Today's Date: 01/15/2022   History of Present Illness 86 y/o female s/p L TKA 11/7.    PT Comments    Despite pain and nausea pt continues to be motivated to work with PT but ultimately she needed a lot of extra time, assist, guarding and encouragement to participate with typical POD1 activities.  She continues to struggle with bed mobility and has definite need for assist to rise to standing.  She lacks TKE with and w/o WBing and is hypersensitive to even very gentle overpressure past 5* and continues to display tightness with flexion that continues to be very sensitive to positioning >70* (aka sitting EOB with unsupported/gravity dependent position).  Pt made very good effort with ambulation despite pain and nausea but ultimately had to sit and vomit.  Further ambulation deferred at that point. Pt still hoping to d/c home tomorrow but continues to feel poorly and have a lot of pain and stiffness.  Will continue to work with PT toward post TKA goals.  Will need to show safe negotiation of steps before being able to go home.      Recommendations for follow up therapy are one component of a multi-disciplinary discharge planning process, led by the attending physician.  Recommendations may be updated based on patient status, additional functional criteria and insurance authorization.  Follow Up Recommendations  Follow physician's recommendations for discharge plan and follow up therapies     Assistance Recommended at Discharge Frequent or constant Supervision/Assistance  Patient can return home with the following A little help with walking and/or transfers;A little help with bathing/dressing/bathroom;Assistance with cooking/housework;Assist for transportation;Help with stairs or ramp for entrance   Equipment Recommendations  None recommended by PT    Recommendations for Other Services        Precautions / Restrictions Precautions Precautions: Fall Required Braces or Orthoses: Knee Immobilizer - Left Restrictions LLE Weight Bearing: Weight bearing as tolerated     Mobility  Bed Mobility Overal bed mobility: Needs Assistance Bed Mobility: Sit to Supine       Sit to supine: Mod assist   General bed mobility comments: Pt very much struggled to get her L LE to EOB and then required more assist to support it to ease down toward the ground as she was stiff/sore/pain limited and generally needing very slow and guarded movement with all mobility    Transfers Overall transfer level: Needs assistance Equipment used: Rolling walker (2 wheels) Transfers: Sit to/from Stand Sit to Stand: Mod assist           General transfer comment: Pt again unable to rise from standard height bed or w/o assist from 3" elevated bed.  She needed repeated cuing for set up, sequencing, UE use, etc    Ambulation/Gait Ambulation/Gait assistance: Min assist Gait Distance (Feet): 60 Feet Assistive device: Rolling walker (2 wheels)         General Gait Details: Pt continues to very slow and guarded gait.  lacks TKE during weight acceptance/stance phase on the L.  Pt feeling nausea on and off t/o the effort and finally did need to sit and vomit.  Further ambulation deferred at that point   Stairs             Wheelchair Mobility    Modified Rankin (Stroke Patients Only)       Balance Overall balance assessment: Needs assistance Sitting-balance support: No upper extremity supported Sitting balance-Leahy Scale:  Good     Standing balance support: Bilateral upper extremity supported, Reliant on assistive device for balance Standing balance-Leahy Scale: Fair Standing balance comment: relaint on the walker and hesitant to take full weight on the R                            Cognition Arousal/Alertness: Awake/alert Behavior During Therapy: WFL for tasks  assessed/performed Overall Cognitive Status: Within Functional Limits for tasks assessed                                 General Comments: continues to report dizzy/nauseous        Exercises Total Joint Exercises Ankle Circles/Pumps: AROM, 10 reps Quad Sets: Strengthening, 10 reps Long Arc Quad: AAROM, 5 reps (pt needing AAROM to support knee as pain was a signficant limiter when knee unsupported by the leg rest - hesitant with ROM great than ~60*) Knee Flexion: AAROM, 5 reps    General Comments        Pertinent Vitals/Pain Pain Assessment Pain Assessment: 0-10 Pain Score: 8  Pain Location: L knee    Home Living                          Prior Function            PT Goals (current goals can now be found in the care plan section) Progress towards PT goals:  (slow progress, nausea, pain, stiffness are limiters)    Frequency    BID      PT Plan Current plan remains appropriate    Co-evaluation              AM-PAC PT "6 Clicks" Mobility   Outcome Measure  Help needed turning from your back to your side while in a flat bed without using bedrails?: None Help needed moving from lying on your back to sitting on the side of a flat bed without using bedrails?: A Lot Help needed moving to and from a bed to a chair (including a wheelchair)?: A Little Help needed standing up from a chair using your arms (e.g., wheelchair or bedside chair)?: A Lot Help needed to walk in hospital room?: A Little Help needed climbing 3-5 steps with a railing? : A Lot 6 Click Score: 16    End of Session Equipment Utilized During Treatment: Gait belt Activity Tolerance: Patient tolerated treatment well Patient left: with chair alarm set;with call bell/phone within reach Nurse Communication: Mobility status PT Visit Diagnosis: Muscle weakness (generalized) (M62.81);Difficulty in walking, not elsewhere classified (R26.2);Pain Pain - Right/Left: Left Pain -  part of body: Knee     Time: 0388-8280 PT Time Calculation (min) (ACUTE ONLY): 58 min  Charges:  $Gait Training: 8-22 mins $Therapeutic Exercise: 8-22 mins $Therapeutic Activity: 23-37 mins                     Kreg Shropshire, DPT 01/15/2022, 6:19 PM

## 2022-01-15 NOTE — Plan of Care (Signed)

## 2022-01-15 NOTE — Progress Notes (Signed)
Subjective:  POD #1 s/p left total knee arthroplasty.   Patient reports left knee pain as moderate.  Patient sitting in bed in pajamas.  Knee immobilizer and Polar Care in place.  Patient complains of anterior left knee pain.  Patient states she was able to get out of bed and walk to the nurses station with physical therapy this morning.  Patient felt nauseated when walking.  Patient found to be hypocalcemic and hypokalemic on her labs this morning.  Hospitalist has been consulted.  Objective:   VITALS:   Vitals:   01/14/22 2338 01/15/22 0434 01/15/22 0701 01/15/22 0829  BP: (!) 99/46 (!) 121/50 (!) 121/54 (!) 143/76  Pulse: 62 66 68   Resp: '18 17 17   '$ Temp: 98 F (36.7 C)  98 F (36.7 C)   TempSrc: Oral  Oral   SpO2: 95% 93%    Weight:      Height:        PHYSICAL EXAM: Left lower extremity: Patient's pajamas prevented access to the dressing today. Neurovascular intact Sensation intact distally Intact pulses distally Dorsiflexion/Plantar flexion intact Compartment soft  LABS  Results for orders placed or performed during the hospital encounter of 01/14/22 (from the past 24 hour(s))  CBC     Status: Abnormal   Collection Time: 01/14/22  4:56 PM  Result Value Ref Range   WBC 6.1 4.0 - 10.5 K/uL   RBC 3.98 3.87 - 5.11 MIL/uL   Hemoglobin 12.1 12.0 - 15.0 g/dL   HCT 35.8 (L) 36.0 - 46.0 %   MCV 89.9 80.0 - 100.0 fL   MCH 30.4 26.0 - 34.0 pg   MCHC 33.8 30.0 - 36.0 g/dL   RDW 12.7 11.5 - 15.5 %   Platelets 233 150 - 400 K/uL   nRBC 0.0 0.0 - 0.2 %  Creatinine, serum     Status: None   Collection Time: 01/14/22  4:56 PM  Result Value Ref Range   Creatinine, Ser 0.61 0.44 - 1.00 mg/dL   GFR, Estimated >60 >60 mL/min  CBC     Status: Abnormal   Collection Time: 01/15/22  3:45 AM  Result Value Ref Range   WBC 6.5 4.0 - 10.5 K/uL   RBC 3.12 (L) 3.87 - 5.11 MIL/uL   Hemoglobin 9.6 (L) 12.0 - 15.0 g/dL   HCT 28.2 (L) 36.0 - 46.0 %   MCV 90.4 80.0 - 100.0 fL   MCH  30.8 26.0 - 34.0 pg   MCHC 34.0 30.0 - 36.0 g/dL   RDW 12.9 11.5 - 15.5 %   Platelets 190 150 - 400 K/uL   nRBC 0.0 0.0 - 0.2 %  Basic metabolic panel     Status: Abnormal   Collection Time: 01/15/22  3:45 AM  Result Value Ref Range   Sodium 137 135 - 145 mmol/L   Potassium 3.3 (L) 3.5 - 5.1 mmol/L   Chloride 115 (H) 98 - 111 mmol/L   CO2 22 22 - 32 mmol/L   Glucose, Bld 92 70 - 99 mg/dL   BUN 13 8 - 23 mg/dL   Creatinine, Ser 0.44 0.44 - 1.00 mg/dL   Calcium 6.4 (LL) 8.9 - 10.3 mg/dL   GFR, Estimated >60 >60 mL/min   Anion gap 0 (L) 5 - 15  Magnesium     Status: None   Collection Time: 01/15/22  7:52 AM  Result Value Ref Range   Magnesium 1.9 1.7 - 2.4 mg/dL  Phosphorus  Status: None   Collection Time: 01/15/22  7:52 AM  Result Value Ref Range   Phosphorus 3.4 2.5 - 4.6 mg/dL    DG Knee Left Port  Result Date: 01/14/2022 CLINICAL DATA:  Follow-up total knee arthroplasty EXAM: PORTABLE LEFT KNEE - 1-2 VIEW COMPARISON:  None Available. FINDINGS: Total knee arthroplasty components appear well positioned. No unexpected finding. IMPRESSION: Good appearance following total knee arthroplasty. Electronically Signed   By: Nelson Chimes M.D.   On: 01/14/2022 14:29    Assessment/Plan: 1 Day Post-Op   Principal Problem:   S/P total knee arthroplasty, left Active Problems:   HLD (hyperlipidemia)   Hypocalcemia   Hypokalemia   Normocytic anemia   OP (osteoporosis)  Patient's calcium and potassium have been repleted by the hospitalist.  Labs will be redrawn in the morning.  Continue physical therapy.  Lovenox started today for DVT prophylaxis.  Continue pain management.    Thornton Park , MD 01/15/2022, 2:05 PM

## 2022-01-15 NOTE — Consult Note (Signed)
Medical Consultation   Sheryl Miller  RWE:[id number]  DOB: 11/24/1935  DOA: 01/14/2022  PCP: Teodora Medici, DO   Outpatient Specialists:    Requesting physician: -Dr. Mack Guise of Ortho  Reason for consultation: -Hypocalcemia  History of Present Illness:  Sheryl Miller is an 86 y.o. female with PMH of hyperlipidemia, GERD, osteoporosis, left knee pain, who is admitted by Dr. Mack Guise of Ortho for left knee replacement.  Patient is POD1 of left knee replacement.  We are asked to consult due to hypocalcemia.  Per Dr. Mack Guise, patient underwent successful left knee replacement on 11/7.  Patient has moderate pain in the surgical site.  Denies chest pain, cough, shortness of breath.  No fever or chills.  No nausea vomiting, diarrhea or abdominal pain.  No symptoms of UTI.  Patient was found to have hypocalcemia with calcium 6.4.  No muscle cramping.  We are asked to consult.  Date reviewed, lab, image and vitals: Calcium 6.4, potassium 3.3, anemia with hemoglobin 9.6 (12.1 01/14/2022), renal function okay, temperature normal, blood pressure 99/46, heart rate 68, RR 17, oxygen saturation 93-95% on room air.    EKG:  Not done in ED yet  Review of Systems:   General: no fevers, chills, no changes in body weight, no changes in appetite Skin: no rash HEENT: no blurry vision, hearing changes or sore throat Pulm: no dyspnea, coughing, wheezing CV: no chest pain, palpitations, shortness of breath Abd: no nausea/vomiting, abdominal pain, diarrhea/constipation GU: no dysuria, hematuria, polyuria Ext: Has left knee pain Neuro: no weakness, numbness, or tingling    Past Medical History: Past Medical History:  Diagnosis Date   Allergic rhinitis, cause unspecified    Breast cyst    Diverticulosis of colon (without mention of hemorrhage)    Esophageal reflux    Female stress incontinence    Internal hemorrhoids without mention of complication     Osteoporosis, unspecified    Other and unspecified hyperlipidemia    Raynaud's syndrome    Rheumatic fever    Unspecified constipation     Past Surgical History: Past Surgical History:  Procedure Laterality Date   ABDOMINAL HYSTERECTOMY     BLADDER REPAIR     BREAST BIOPSY Right 2000   fibrocystic disease   KNEE ARTHROSCOPY Right ~5/16   ROTATOR CUFF REPAIR  2006   SHOULDER ARTHROSCOPY WITH ROTATOR CUFF REPAIR AND SUBACROMIAL DECOMPRESSION  2003   R, Scott Dean   TONSILLECTOMY     TONSILLECTOMY     TOTAL KNEE ARTHROPLASTY Left 01/14/2022   Procedure: TOTAL KNEE ARTHROPLASTY;  Surgeon: Thornton Park, MD;  Location: [facility] ORS;  Service: Orthopedics;  Laterality: Left;   TUBAL LIGATION       Allergies:  No Known Allergies   Social History:  reports that she has never smoked. She has never used smokeless tobacco. She reports current alcohol use. She reports that she does not use drugs.  Family History: Family History  Problem Relation Age of Onset   Coronary artery disease Mother    Heart disease Mother    Diabetes Mother    COPD Father    Breast cancer Sister        2 (age 70?, age 81?); "Gene pos"   Diabetes Brother    Breast cancer Maternal Aunt        2   Stroke Paternal Grandmother    Diabetes Brother    Breast  cancer Sister    Breast cancer Daughter        ? at age 40   Breast cancer Maternal Aunt    Breast cancer Niece    Breast cancer Niece      Physical Exam: Vitals:   01/14/22 2012 01/14/22 2338 01/15/22 0434 01/15/22 0701  BP: (!) 149/61 (!) 99/46 (!) 121/50 (!) 121/54  Pulse: 70 62 66 68  Resp: '17 18 17 17  '$ Temp: 97.7 F (36.5 C) 98 F (36.7 C)  98 F (36.7 C)  TempSrc: Oral Oral  Oral  SpO2: 95% 95% 93%   Weight:      Height:         General: Not in acute distress HEENT:       Eyes: PERRL, EOMI, no scleral icterus.       ENT: No discharge from the ears and nose, no pharynx injection, no tonsillar enlargement.        Neck: No JVD,  no bruit, no mass felt. Heme: No neck lymph node enlargement. Cardiac: S1/S2, RRR, No murmurs, No gallops or rubs. Respiratory: No rales, wheezing, rhonchi or rubs. GI: Soft, nondistended, nontender, no rebound pain, no organomegaly, BS present. GU: No hematuria Ext: No pitting leg edema bilaterally. 1+DP/PT pulse bilaterally. Musculoskeletal: S/p of left knee replacement, has left knee pain in surgical site Skin: No rashes.  Neuro: Alert, oriented X3, cranial nerves II-XII grossly intact, moves all extremities normally.  Psych: Patient is not psychotic, no suicidal or hemocidal ideation.    Data reviewed:  I have personally reviewed following labs and imaging studies Labs:  CBC: Recent Labs  Lab 01/14/22 1656 01/15/22 0345  WBC 6.1 6.5  HGB 12.1 9.6*  HCT 35.8* 28.2*  MCV 89.9 90.4  PLT 233 628    Basic Metabolic Panel: Recent Labs  Lab 01/14/22 1656 01/15/22 0345  NA  --  137  K  --  3.3*  CL  --  115*  CO2  --  22  GLUCOSE  --  92  BUN  --  13  CREATININE 0.61 0.44  CALCIUM  --  6.4*   GFR Estimated Creatinine Clearance: 48.3 mL/min (by C-G formula based on SCr of 0.44 mg/dL). Liver Function Tests: No results for input(s): "AST", "ALT", "ALKPHOS", "BILITOT", "PROT", "ALBUMIN" in the last 168 hours. No results for input(s): "LIPASE", "AMYLASE" in the last 168 hours. No results for input(s): "AMMONIA" in the last 168 hours. Coagulation profile No results for input(s): "INR", "PROTIME" in the last 168 hours.  Cardiac Enzymes: No results for input(s): "CKTOTAL", "CKMB", "CKMBINDEX", "TROPONINI" in the last 168 hours. BNP: Invalid input(s): "POCBNP" CBG: No results for input(s): "GLUCAP" in the last 168 hours. D-Dimer No results for input(s): "DDIMER" in the last 72 hours. Hgb A1c No results for input(s): "HGBA1C" in the last 72 hours. Lipid Profile No results for input(s): "CHOL", "HDL", "LDLCALC", "TRIG", "CHOLHDL", "LDLDIRECT" in the last 72  hours. Thyroid function studies No results for input(s): "TSH", "T4TOTAL", "T3FREE", "THYROIDAB" in the last 72 hours.  Invalid input(s): "FREET3" Anemia work up No results for input(s): "VITAMINB12", "FOLATE", "FERRITIN", "TIBC", "IRON", "RETICCTPCT" in the last 72 hours. Urinalysis    Component Value Date/Time   COLORURINE YELLOW (A) 01/06/2022 1003   APPEARANCEUR HAZY (A) 01/06/2022 1003   APPEARANCEUR Clear 04/23/2011 1113   LABSPEC 1.014 01/06/2022 1003   LABSPEC 1.026 04/23/2011 1113   PHURINE 5.0 01/06/2022 1003   GLUCOSEU NEGATIVE 01/06/2022 1003   GLUCOSEU Negative  04/23/2011 1113   HGBUR NEGATIVE 01/06/2022 [place] 01/06/2022 1003   BILIRUBINUR negative 07/20/2015 1408   BILIRUBINUR Negative 04/23/2011 [place] 01/06/2022 1003   PROTEINUR NEGATIVE 01/06/2022 1003   UROBILINOGEN 0.2 07/20/2015 1408   UROBILINOGEN 0.2 08/17/2007 1034   NITRITE NEGATIVE 01/06/2022 1003   [place] 01/06/2022 1003   LEUKOCYTESUR Negative 04/23/2011 1113     Microbiology Recent Results (from the past 240 hour(s))  Surgical pcr screen     Status: None   Collection Time: 01/06/22 10:03 AM   Specimen: Nasal Mucosa; Nasal Swab  Result Value Ref Range Status   MRSA, PCR NEGATIVE NEGATIVE Final   Staphylococcus aureus NEGATIVE NEGATIVE Final    Comment: (NOTE) The Xpert SA Assay (FDA approved for NASAL specimens in patients 6 years of age and older), is one component of a comprehensive surveillance program. It is not intended to diagnose infection nor to guide or monitor treatment. Performed at [facility], [address] [place],  [address]        Inpatient Medications:   Scheduled Meds:  acetaminophen  1,000 mg Oral Q6H   acidophilus  1 capsule Oral Daily   brimonidine  1 drop Both Eyes BID   calcium carbonate  1,250 mg Oral TID WC   vitamin D3  1,000 Units Oral Daily   docusate sodium  100 mg Oral BID    enoxaparin (LOVENOX) injection  30 mg Subcutaneous BID   melatonin  5 mg Oral QHS   multivitamin with minerals  1 tablet Oral Daily   simvastatin  40 mg Oral QHS   traMADol  50 mg Oral Q6H   Continuous Infusions:  sodium chloride Stopped (01/15/22 0509)   calcium gluconate 2,000 mg (01/15/22 0813)   methocarbamol (ROBAXIN) IV       Radiological Exams on Admission: DG Knee Left Port  Result Date: 01/14/2022 CLINICAL DATA:  Follow-up total knee arthroplasty EXAM: PORTABLE LEFT KNEE - 1-2 VIEW COMPARISON:  None Available. FINDINGS: Total knee arthroplasty components appear well positioned. No unexpected finding. IMPRESSION: Good appearance following total knee arthroplasty. Electronically Signed   By: Nelson Chimes M.D.   On: 01/14/2022 14:29    Impression/Recommendations Principal Problem:   S/P total knee arthroplasty, left Active Problems:   Hypocalcemia   Hypokalemia   HLD (hyperlipidemia)   Normocytic anemia   OP (osteoporosis)    Assessment and Plan:  S/P total knee arthroplasty, left: POD 1. Has tolerated pain in the surgical site.  -Pain management per primary Ortho team -Patient is on as needed Tylenol, as needed Dilaudid, as needed oxycodone -As needed Robaxin -Patient is on subcutaneous Lovenox for DVT prophylaxis  Hypocalcemia: Calcium 6.4.  Etiology is not clear -Give 2 g of calcium gluconate IV -Continue home vitamin D supplement -Start Os-Cal supplement -Check vitamin D 125 level and PTH level  Hypokalemia: Potassium 3.3 -Repleted potassium -Check magnesium level and phosphorus level  HLD (hyperlipidemia) -Zocor  Normocytic anemia: Hemoglobin 12.1 on 01/14/2022.  Today 9.6, likely due to blood loss and surgery -Follow-up with CBC  History of OP (osteoporosis) -Vitamin D and Os-cal supplement      Thank you for this consultation.  Our [facility] hospitalist team will follow the patient with you.  Time Spent: 35 mion     Ivor Costa M.D. [facility] 01/15/2022, 8:22 AM

## 2022-01-16 ENCOUNTER — Other Ambulatory Visit: Payer: Self-pay

## 2022-01-16 DIAGNOSIS — K219 Gastro-esophageal reflux disease without esophagitis: Secondary | ICD-10-CM | POA: Diagnosis present

## 2022-01-16 DIAGNOSIS — Z96652 Presence of left artificial knee joint: Secondary | ICD-10-CM | POA: Diagnosis not present

## 2022-01-16 DIAGNOSIS — E222 Syndrome of inappropriate secretion of antidiuretic hormone: Secondary | ICD-10-CM | POA: Diagnosis present

## 2022-01-16 DIAGNOSIS — E871 Hypo-osmolality and hyponatremia: Secondary | ICD-10-CM

## 2022-01-16 DIAGNOSIS — Z8249 Family history of ischemic heart disease and other diseases of the circulatory system: Secondary | ICD-10-CM | POA: Diagnosis not present

## 2022-01-16 DIAGNOSIS — Z833 Family history of diabetes mellitus: Secondary | ICD-10-CM | POA: Diagnosis not present

## 2022-01-16 DIAGNOSIS — I7300 Raynaud's syndrome without gangrene: Secondary | ICD-10-CM | POA: Diagnosis present

## 2022-01-16 DIAGNOSIS — R112 Nausea with vomiting, unspecified: Secondary | ICD-10-CM | POA: Diagnosis present

## 2022-01-16 DIAGNOSIS — E876 Hypokalemia: Secondary | ICD-10-CM | POA: Diagnosis present

## 2022-01-16 DIAGNOSIS — Z803 Family history of malignant neoplasm of breast: Secondary | ICD-10-CM | POA: Diagnosis not present

## 2022-01-16 DIAGNOSIS — M1712 Unilateral primary osteoarthritis, left knee: Secondary | ICD-10-CM | POA: Diagnosis present

## 2022-01-16 DIAGNOSIS — D6489 Other specified anemias: Secondary | ICD-10-CM | POA: Diagnosis present

## 2022-01-16 DIAGNOSIS — R911 Solitary pulmonary nodule: Secondary | ICD-10-CM | POA: Diagnosis present

## 2022-01-16 DIAGNOSIS — M810 Age-related osteoporosis without current pathological fracture: Secondary | ICD-10-CM | POA: Diagnosis present

## 2022-01-16 DIAGNOSIS — E785 Hyperlipidemia, unspecified: Secondary | ICD-10-CM | POA: Diagnosis present

## 2022-01-16 DIAGNOSIS — Z825 Family history of asthma and other chronic lower respiratory diseases: Secondary | ICD-10-CM | POA: Diagnosis not present

## 2022-01-16 DIAGNOSIS — Z823 Family history of stroke: Secondary | ICD-10-CM | POA: Diagnosis not present

## 2022-01-16 DIAGNOSIS — Z90710 Acquired absence of both cervix and uterus: Secondary | ICD-10-CM | POA: Diagnosis not present

## 2022-01-16 LAB — BASIC METABOLIC PANEL
Anion gap: 8 (ref 5–15)
Anion gap: 6 (ref 5–15)
Anion gap: 5 (ref 5–15)
Anion gap: 6 (ref 5–15)
BUN: 11 mg/dL (ref 8–23)
BUN: 11 mg/dL (ref 8–23)
BUN: 12 mg/dL (ref 8–23)
BUN: 10 mg/dL (ref 8–23)
CO2: 25 mmol/L (ref 22–32)
CO2: 26 mmol/L (ref 22–32)
CO2: 29 mmol/L (ref 22–32)
CO2: 30 mmol/L (ref 22–32)
Calcium: 8.9 mg/dL (ref 8.9–10.3)
Calcium: 8.8 mg/dL — ABNORMAL LOW (ref 8.9–10.3)
Calcium: 8.7 mg/dL — ABNORMAL LOW (ref 8.9–10.3)
Calcium: 9.5 mg/dL (ref 8.9–10.3)
Chloride: 95 mmol/L — ABNORMAL LOW (ref 98–111)
Chloride: 94 mmol/L — ABNORMAL LOW (ref 98–111)
Chloride: 88 mmol/L — ABNORMAL LOW (ref 98–111)
Chloride: 95 mmol/L — ABNORMAL LOW (ref 98–111)
Creatinine, Ser: 0.51 mg/dL (ref 0.44–1.00)
Creatinine, Ser: 0.52 mg/dL (ref 0.44–1.00)
Creatinine, Ser: 0.46 mg/dL (ref 0.44–1.00)
Creatinine, Ser: 0.68 mg/dL (ref 0.44–1.00)
GFR, Estimated: >60 mL/min (ref >60)
GFR, Estimated: >60 mL/min (ref >60)
GFR, Estimated: >60 mL/min (ref >60)
GFR, Estimated: >60 mL/min (ref >60)
Glucose, Bld: 141 mg/dL — ABNORMAL HIGH (ref 70–99)
Glucose, Bld: 127 mg/dL — ABNORMAL HIGH (ref 70–99)
Glucose, Bld: 134 mg/dL — ABNORMAL HIGH (ref 70–99)
Glucose, Bld: 119 mg/dL — ABNORMAL HIGH (ref 70–99)
Potassium: 4.5 mmol/L (ref 3.5–5.1)
Potassium: 4.8 mmol/L (ref 3.5–5.1)
Potassium: 4.6 mmol/L (ref 3.5–5.1)
Potassium: 4.7 mmol/L (ref 3.5–5.1)
Sodium: 128 mmol/L — ABNORMAL LOW (ref 135–145)
Sodium: 126 mmol/L — ABNORMAL LOW (ref 135–145)
Sodium: 122 mmol/L — ABNORMAL LOW (ref 135–145)
Sodium: 131 mmol/L — ABNORMAL LOW (ref 135–145)

## 2022-01-16 LAB — CALCITRIOL (1,25 DI-OH VIT D): Vit D, 1,25-Dihydroxy: 37.5 pg/mL (ref 24.8–81.5)

## 2022-01-16 LAB — CBC
HCT: 29.9 % — ABNORMAL LOW (ref 36.0–46.0)
Hemoglobin: 10.1 g/dL — ABNORMAL LOW (ref 12.0–15.0)
MCH: 30.1 pg (ref 26.0–34.0)
MCHC: 33.8 g/dL (ref 30.0–36.0)
MCV: 89 fL (ref 80.0–100.0)
Platelets: 217 10*3/uL (ref 150–400)
RBC: 3.36 MIL/uL — ABNORMAL LOW (ref 3.87–5.11)
RDW: 12.5 % (ref 11.5–15.5)
WBC: 8.8 10*3/uL (ref 4.0–10.5)
nRBC: 0 % (ref 0.0–0.2)

## 2022-01-16 LAB — HEPATIC FUNCTION PANEL
ALT: 18 U/L (ref 0–44)
AST: 19 U/L (ref 15–41)
Albumin: 3 g/dL — ABNORMAL LOW (ref 3.5–5.0)
Alkaline Phosphatase: 63 U/L (ref 38–126)
Bilirubin, Direct: 0.1 mg/dL (ref 0.0–0.2)
Indirect Bilirubin: 0.7 mg/dL (ref 0.3–0.9)
Total Bilirubin: 0.8 mg/dL (ref 0.3–1.2)
Total Protein: 5.9 g/dL — ABNORMAL LOW (ref 6.5–8.1)

## 2022-01-16 LAB — PARATHYROID HORMONE, INTACT (NO CA): PTH: 25 pg/mL (ref 15–65)

## 2022-01-16 MED ORDER — TOLVAPTAN 15 MG PO TABS
15.0000 mg | ORAL_TABLET | Freq: Once | ORAL | Status: AC
  Administered 2022-01-16: 15 mg via ORAL
  Filled 2022-01-16: qty 1

## 2022-01-16 MED ORDER — SODIUM CHLORIDE 0.9 % IV SOLN: INTRAVENOUS | Status: DC

## 2022-01-16 NOTE — Progress Notes (Signed)
Physical Therapy Treatment Patient Details Name: Sheryl Miller MRN: [id number] DOB: 03-07-1936 Today's Date: 01/16/2022   History of Present Illness 86 y/o female s/p L TKA 11/7.    PT Comments    Pt seen for PT tx with pt agreeable. Pt is able to complete STS from recliner with min assist with BUE pushing to stand but extra time to transition BUE to RW & slight posterior lean. Pt reports her husband can assist her with transfers PRN. Pt ambulates increased distances with RW & supervision with decreased gait speed but fair balance overall. After education/demo pt is able to negotiate 4 steps laterally with L rail & min assist with min cuing. Pt reports she would rather go home vs SNF rehab & this PT believes that's a reasonable option as pt reports her husband & friend can provide assistance PRN. Notified team of pt's wishes & PT recommendation of HHPT f/u. Will plan to practice stairs one more time tomorrow AM, but pt reports comfort with stair negotiation on this date.     Recommendations for follow up therapy are one component of a multi-disciplinary discharge planning process, led by the attending physician.  Recommendations may be updated based on patient status, additional functional criteria and insurance authorization.  Follow Up Recommendations  Follow physician's recommendations for discharge plan and follow up therapies     Assistance Recommended at Discharge Intermittent Supervision/Assistance  Patient can return home with the following A little help with walking and/or transfers;A little help with bathing/dressing/bathroom;Assistance with cooking/housework;Assist for transportation;Help with stairs or ramp for entrance   Equipment Recommendations  None recommended by PT (per chart, pt has RW)    Recommendations for Other Services       Precautions / Restrictions Precautions Precautions: Fall Required Braces or Orthoses: Knee Immobilizer - Left Knee Immobilizer -  Left:  (in bed) Restrictions Weight Bearing Restrictions: Yes LLE Weight Bearing: Weight bearing as tolerated     Mobility  Bed Mobility Overal bed mobility: Needs Assistance Bed Mobility: Supine to Sit     Supine to sit: Min assist, HOB elevated     General bed mobility comments: not tested, pt received & left in recliner    Transfers Overall transfer level: Needs assistance Equipment used: Rolling walker (2 wheels) Transfers: Sit to/from Stand Sit to Stand: Min assist           General transfer comment: STS from recliner ~3 times during session with BUE pushing to stand, extra time to transition BUE from recliner armrests to RW, slight posterior lean    Ambulation/Gait Ambulation/Gait assistance: Supervision Gait Distance (Feet): 200 Feet Assistive device: Rolling walker (2 wheels) Gait Pattern/deviations: Decreased step length - right, Decreased step length - left, Decreased stride length, Decreased dorsiflexion - right, Decreased dorsiflexion - left, Decreased weight shift to left Gait velocity: Significantly decreased gait speed     General Gait Details: Pt with decreased L hip/knee flexion during swing phase, decreased dorsiflexion but this improves as pt continues to ambulate.   Stairs Stairs: Yes Stairs assistance: Min assist Stair Management: One rail Left, Step to pattern Number of Stairs: 4 General stair comments: PT provides education & demo re: stair negotiation laterally with L rail. Pt return demonstrates with min assist with min cuing for compensatory pattern.   Wheelchair Mobility    Modified Rankin (Stroke Patients Only)       Balance Overall balance assessment: Needs assistance Sitting-balance support: No upper extremity supported Sitting balance-Leahy Scale: Good  Standing balance support: Bilateral upper extremity supported, Reliant on assistive device for balance Standing balance-Leahy Scale: Fair                               Cognition Arousal/Alertness: Awake/alert Behavior During Therapy: Flat affect Overall Cognitive Status: Within Functional Limits for tasks assessed                                          Exercises Total Joint Exercises Quad Sets: Strengthening, AROM, Left, 10 reps, Supine Heel Slides: AAROM, Strengthening, Supine, Left, 10 reps Straight Leg Raises: AAROM, Supine, Strengthening, Left, 10 reps Long Arc Quad: AAROM, Strengthening, Seated, Left, 5 reps    General Comments General comments (skin integrity, edema, etc.): Pt c/o slight nausea at end of gait.      Pertinent Vitals/Pain Pain Assessment Pain Assessment: Faces Pain Score: 6  Faces Pain Scale: Hurts little more Pain Location: L knee Pain Descriptors / Indicators: Discomfort Pain Intervention(s): Premedicated before session, Monitored during session    Home Living                          Prior Function            PT Goals (current goals can now be found in the care plan section) Acute Rehab PT Goals Patient Stated Goal: go home tomorrow PT Goal Formulation: With patient Time For Goal Achievement: 01/27/22 Potential to Achieve Goals: Good Progress towards PT goals: Progressing toward goals    Frequency    BID      PT Plan Current plan remains appropriate    Co-evaluation              AM-PAC PT "6 Clicks" Mobility   Outcome Measure  Help needed turning from your back to your side while in a flat bed without using bedrails?: None Help needed moving from lying on your back to sitting on the side of a flat bed without using bedrails?: A Lot Help needed moving to and from a bed to a chair (including a wheelchair)?: A Little Help needed standing up from a chair using your arms (e.g., wheelchair or bedside chair)?: A Little Help needed to walk in hospital room?: A Little Help needed climbing 3-5 steps with a railing? : A Little 6 Click Score: 18    End of  Session   Activity Tolerance: Patient tolerated treatment well Patient left: in chair;with chair alarm set;with call bell/phone within reach Nurse Communication: Mobility status PT Visit Diagnosis: Muscle weakness (generalized) (M62.81);Difficulty in walking, not elsewhere classified (R26.2);Pain Pain - Right/Left: Left Pain - part of body: Knee     Time: 1340-1409 PT Time Calculation (min) (ACUTE ONLY): 29 min  Charges:  $Therapeutic Exercise: 8-22 mins $Therapeutic Activity: 23-37 mins                     Lavone Nian, PT, DPT 01/16/22, 2:17 PM    Waunita Schooner 01/16/2022, 2:15 PM

## 2022-01-16 NOTE — Progress Notes (Signed)
Occupational Therapy Treatment Patient Details Name: Sheryl Miller MRN: [id number] DOB: 20-Oct-1935 Today's Date: 01/16/2022   History of present illness 86 y/o female s/p L TKA 11/7.   OT comments  Patient in supine upon arrival and agreeable to OT services. Knee immobilizer doffed while in supine. Transfer supine<>sit min A for LB management. Patient was able to complete UB strengthening exercises using red theraband 10x each (bicep curls, shoulder extension, and shoulder diagonals) demonstrated understanding. Patient transferred sit>stand from EOB with min A using RW. Ambulated ~25 feet to bathroom with min guard, toilet transfer min A, peri care with min guard for safety. Knee immobilizer donn while in supine. Patient left in bed with call bell in reach, bed alarm set, and all needs met.    Recommendations for follow up therapy are one component of a multi-disciplinary discharge planning process, led by the attending physician.  Recommendations may be updated based on patient status, additional functional criteria and insurance authorization.    Follow Up Recommendations  Follow physician's recommendations for discharge plan and follow up therapies    Assistance Recommended at Discharge Intermittent Supervision/Assistance  Patient can return home with the following  A little help with walking and/or transfers;A little help with bathing/dressing/bathroom;Help with stairs or ramp for entrance;Assist for transportation;Assistance with cooking/housework   Equipment Recommendations  None recommended by OT       Precautions / Restrictions Precautions Precautions: Fall Required Braces or Orthoses: Knee Immobilizer - Left Knee Immobilizer - Left: Other (comment) (while in bed.) Restrictions Weight Bearing Restrictions: Yes LLE Weight Bearing: Weight bearing as tolerated       Mobility Bed Mobility Overal bed mobility: Needs Assistance Bed Mobility: Supine to Sit, Sit to Supine      Supine to sit: Min assist, HOB elevated Sit to supine: Min assist, HOB elevated   General bed mobility comments: assistance for LE management back into bed.    Transfers Overall transfer level: Needs assistance Equipment used: Rolling walker (2 wheels) Transfers: Sit to/from Stand Sit to Stand: Min assist                 Balance Overall balance assessment: Needs assistance Sitting-balance support: No upper extremity supported Sitting balance-Leahy Scale: Good     Standing balance support: Bilateral upper extremity supported, Reliant on assistive device for balance Standing balance-Leahy Scale: Fair                             ADL either performed or assessed with clinical judgement   ADL                           Toilet Transfer: Minimal assistance   Toileting- Clothing Manipulation and Hygiene: Min guard              Extremity/Trunk Assessment Upper Extremity Assessment Upper Extremity Assessment: Generalized weakness   Lower Extremity Assessment Lower Extremity Assessment: Overall WFL for tasks assessed;LLE deficits/detail         Cognition Arousal/Alertness: Awake/alert Behavior During Therapy: WFL for tasks assessed/performed Overall Cognitive Status: Within Functional Limits for tasks assessed                                                General Comments Pt c/o slight nausea at  end of gait.    Pertinent Vitals/ Pain       Pain Assessment Pain Assessment: Faces Faces Pain Scale: Hurts little more Pain Location: L knee Pain Descriptors / Indicators: Discomfort Pain Intervention(s): Premedicated before session, Monitored during session   Frequency  Min 2X/week        Progress Toward Goals  OT Goals(current goals can now be found in the care plan section)     Acute Rehab OT Goals Patient Stated Goal: to return home OT Goal Formulation: With patient Time For Goal Achievement:  01/29/22 Potential to Achieve Goals: Good   AM-PAC OT "6 Clicks" Daily Activity     Outcome Measure   Help from another person eating meals?: None Help from another person taking care of personal grooming?: A Little Help from another person toileting, which includes using toliet, bedpan, or urinal?: A Little Help from another person bathing (including washing, rinsing, drying)?: A Little Help from another person to put on and taking off regular upper body clothing?: None Help from another person to put on and taking off regular lower body clothing?: A Little 6 Click Score: 20    End of Session Equipment Utilized During Treatment: Rolling walker (2 wheels)  OT Visit Diagnosis: Muscle weakness (generalized) (M62.81);Dizziness and giddiness (R42)   Activity Tolerance Patient tolerated treatment well   Patient Left in bed;with call bell/phone within reach;with bed alarm set   Nurse Communication Mobility status        Time: 7829-5621 OT Time Calculation (min): 30 min  Charges: OT General Charges $OT Visit: 1 Visit OT Treatments $Self Care/Home Management : 8-22 mins $Therapeutic Exercise: 8-22 mins    Lisseth Brazeau, OTS 01/16/2022, 3:14 PM

## 2022-01-16 NOTE — Progress Notes (Signed)
Subjective:  POD #2 s/p left total knee arthroplasty.   Patient reports left knee pain as moderate.  Patient states that she vomited when she got up and walked with physical therapy yesterday afternoon.  She is concerned that she cannot lift her left leg off the bed.  Patient is noted to be hyponatremic today.  The hospitalist has consulted nephrology.  Patient's calcium and potassium have returned to normal values.  Objective:   VITALS:   Vitals:   01/15/22 0829 01/15/22 1548 01/15/22 2325 01/16/22 0902  BP: (!) 143/76 (!) 151/60 131/62 (!) 161/76  Pulse:  71 81 86  Resp:  '16 17 17  '$ Temp:  97.9 F (36.6 C) 98.6 F (37 C) 98 F (36.7 C)  TempSrc:      SpO2:  92% 92% 95%  Weight:      Height:        PHYSICAL EXAM: Left lower extremity: I personally removed the patient's outer bandages.  Her Aquacel bandage has mild sanguinous drainage.  Patient has ecchymosis and swelling over the left knee but no erythema. Neurovascular intact Sensation intact distally Intact pulses distally Dorsiflexion/Plantar flexion intact No cellulitis present Compartment soft  LABS  Results for orders placed or performed during the hospital encounter of 01/14/22 (from the past 24 hour(s))  CBC     Status: Abnormal   Collection Time: 01/16/22  3:07 AM  Result Value Ref Range   WBC 8.8 4.0 - 10.5 K/uL   RBC 3.36 (L) 3.87 - 5.11 MIL/uL   Hemoglobin 10.1 (L) 12.0 - 15.0 g/dL   HCT 29.9 (L) 36.0 - 46.0 %   MCV 89.0 80.0 - 100.0 fL   MCH 30.1 26.0 - 34.0 pg   MCHC 33.8 30.0 - 36.0 g/dL   RDW 12.5 11.5 - 15.5 %   Platelets 217 150 - 400 K/uL   nRBC 0.0 0.0 - 0.2 %  Basic metabolic panel     Status: Abnormal   Collection Time: 01/16/22  3:07 AM  Result Value Ref Range   Sodium 128 (L) 135 - 145 mmol/L   Potassium 4.5 3.5 - 5.1 mmol/L   Chloride 95 (L) 98 - 111 mmol/L   CO2 25 22 - 32 mmol/L   Glucose, Bld 141 (H) 70 - 99 mg/dL   BUN 11 8 - 23 mg/dL   Creatinine, Ser 0.51 0.44 - 1.00 mg/dL    Calcium 8.9 8.9 - 10.3 mg/dL   GFR, Estimated >60 >60 mL/min   Anion gap 8 5 - 15  Basic metabolic panel     Status: Abnormal   Collection Time: 01/16/22  8:04 AM  Result Value Ref Range   Sodium 126 (L) 135 - 145 mmol/L   Potassium 4.8 3.5 - 5.1 mmol/L   Chloride 94 (L) 98 - 111 mmol/L   CO2 26 22 - 32 mmol/L   Glucose, Bld 127 (H) 70 - 99 mg/dL   BUN 11 8 - 23 mg/dL   Creatinine, Ser 0.52 0.44 - 1.00 mg/dL   Calcium 8.8 (L) 8.9 - 10.3 mg/dL   GFR, Estimated >60 >60 mL/min   Anion gap 6 5 - 15    DG Knee Left Port  Result Date: 01/14/2022 CLINICAL DATA:  Follow-up total knee arthroplasty EXAM: PORTABLE LEFT KNEE - 1-2 VIEW COMPARISON:  None Available. FINDINGS: Total knee arthroplasty components appear well positioned. No unexpected finding. IMPRESSION: Good appearance following total knee arthroplasty. Electronically Signed   By: Jan Fireman.D.  On: 01/14/2022 14:29    Assessment/Plan: 2 Days Post-Op   Principal Problem:   S/P total knee arthroplasty, left Active Problems:   HLD (hyperlipidemia)   Hypocalcemia   Hypokalemia   Normocytic anemia   OP (osteoporosis)  Patient is slowly progressing postop with PT.  Her main issue now is hyponatremia.  Patient is awaiting nephrology evaluation.  Patient is 86 years old and will likely benefit from a skilled nursing facility stay.  Patient will remain on Lovenox newly until follow-up in my office in approximately 2 weeks.    Thornton Park , MD 01/16/2022, 11:35 AM

## 2022-01-16 NOTE — TOC Progression Note (Addendum)
Transition of Care (TOC) - Progression Note    Patient Details  Name: Sheryl Miller MRN: [id number] Date of Birth: October 11, 1935  Transition of Care [facility]) CM/SW [place], RN Phone Number: 01/16/2022, 3:38 PM  Clinical Narrative:    Met with the patient, her husband is at home and helps her however he has dementia and they feel it would be better for her to go to STR, She agreed to a bed search,    Expected Discharge Plan: [place] Barriers to Discharge: Continued Medical Work up  Expected Discharge Plan and Services Expected Discharge Plan: [place]   Discharge Planning Services: CM Consult   Living arrangements for the past 2 months: Single Family Home                 DME Arranged: N/A DME Agency: NA                   Social Determinants of Health (SDOH) Interventions Housing Interventions: Intervention Not Indicated  Readmission Risk Interventions     No data to display

## 2022-01-16 NOTE — Progress Notes (Signed)
MEDICATION RELATED CONSULT NOTE - INITIAL   Pharmacy Consult for tolvaptan Indication: hyponatremia   No Known Allergies  Patient Measurements: Height: '5\' 2"'$  (157.5 cm) Weight: 71.2 kg (156 lb 15.5 oz) IBW/kg (Calculated) : 50.1  Vital Signs: Temp: 98 F (36.7 C) (11/09 0902) BP: 161/76 (11/09 0902) Pulse Rate: 86 (11/09 0902) Intake/Output from previous day: 11/08 0701 - 11/09 0700 In: 300 [P.O.:200; IV Piggyback:100] Out: -  Intake/Output from this shift: Total I/O In: 100 [P.O.:100] Out: -   Labs: Recent Labs    01/14/22 1656 01/15/22 0345 01/15/22 0752 01/16/22 0307 01/16/22 0804  WBC 6.1 6.5  --  8.8  --   HGB 12.1 9.6*  --  10.1*  --   HCT 35.8* 28.2*  --  29.9*  --   PLT 233 190  --  217  --   CREATININE 0.61 0.44  --  0.51 0.52  MG  --   --  1.9  --   --   PHOS  --   --  3.4  --   --    Estimated Creatinine Clearance: 48.3 mL/min (by C-G formula based on SCr of 0.52 mg/dL).   Microbiology: Recent Results (from the past 720 hour(s))  Surgical pcr screen     Status: None   Collection Time: 01/06/22 10:03 AM   Specimen: Nasal Mucosa; Nasal Swab  Result Value Ref Range Status   MRSA, PCR NEGATIVE NEGATIVE Final   Staphylococcus aureus NEGATIVE NEGATIVE Final    Comment: (NOTE) The Xpert SA Assay (FDA approved for NASAL specimens in patients 78 years of age and older), is one component of a comprehensive surveillance program. It is not intended to diagnose infection nor to guide or monitor treatment. Performed at [facility], [address], [place], [place] [address]     Medical History: Past Medical History:  Diagnosis Date   Allergic rhinitis, cause unspecified    Breast cyst    Diverticulosis of colon (without mention of hemorrhage)    Esophageal reflux    Female stress incontinence    Internal hemorrhoids without mention of complication    Osteoporosis, unspecified    Other and unspecified hyperlipidemia    Raynaud's  syndrome    Rheumatic fever    Unspecified constipation     Assessment: 86 year old patient admitted with left knee osteoarthritis s/p left total knee arthroplasty 11/7. Past medical history includes osteoporosis (not currently on treatment per my chart review), diverticulitis, Raynaud's syndrome. During admission, sodium acutely declining from 137 >126 despite receiving 0.9% sodium chloride IVF '@75'$  mL/hr from 01/14/22 - 01/16/22. 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  CrCl > 10 mL/min  Goal of Therapy:  Na > 130 (per d/w nephrology NP)   Plan: tolvaptan 15 mg x 1 (order timed for 11/9 '@1500'$ ) - first dose Monitor serum Na every 8 hours x 24 hours then daily.  Contact provider (nephrology consulted) if Na exceeds 8 mEq/L per 8 hours or 12 mEq/L in 24 hours Monitor renal function daily. CTM I&O.    Sheryl Miller 01/16/2022,1:33 PM

## 2022-01-16 NOTE — Progress Notes (Addendum)
Physical Therapy Treatment Patient Details Name: Sheryl Miller MRN: [id number] DOB: 05-31-1935 Today's Date: 01/16/2022   History of Present Illness 86 y/o female s/p L TKA 11/7.    PT Comments    Pt seen for PT tx with pt agreeable. Pt c/o LLE feeling "heavy" throughout. Pt engages in LLE strengthening exercises with AAROM & cuing for technique. Pt requires min assist for supine>sit with HOB elevated, use of bed rails, significantly extra time & cuing for technique. Pt is able to complete STS with mod assist with extra time to power up. Pt is able to increase ambulation distances on this date, with pt requiring CGA with use of RW. Plan to practice stair negotiation during PM session after pt is premedicated.   Addendum: Pt reports she plans to sleep in recliner vs bed upon d/c home.    Recommendations for follow up therapy are one component of a multi-disciplinary discharge planning process, led by the attending physician.  Recommendations may be updated based on patient status, additional functional criteria and insurance authorization.  Follow Up Recommendations  Follow physician's recommendations for discharge plan and follow up therapies     Assistance Recommended at Discharge Frequent or constant Supervision/Assistance  Patient can return home with the following A little help with walking and/or transfers;A little help with bathing/dressing/bathroom;Assistance with cooking/housework;Assist for transportation;Help with stairs or ramp for entrance   Equipment Recommendations  None recommended by PT    Recommendations for Other Services       Precautions / Restrictions Precautions Precautions: Fall Required Braces or Orthoses: Knee Immobilizer - Left Knee Immobilizer - Left:  (in bed) Restrictions Weight Bearing Restrictions: Yes LLE Weight Bearing: Weight bearing as tolerated     Mobility  Bed Mobility Overal bed mobility: Needs Assistance Bed Mobility: Supine to  Sit     Supine to sit: Min assist, HOB elevated     General bed mobility comments: Significantly extra time, Cuing to use RLE to move LLE to EOB, HOB elevated, cuing to use bed rails, extra time to upright trunk.    Transfers Overall transfer level: Needs assistance Equipment used: Rolling walker (2 wheels) Transfers: Sit to/from Stand Sit to Stand: Mod assist           General transfer comment: Posterior lean, RLE not completely underneath BOS, cuing for hand placement, extra time to power up to standing.    Ambulation/Gait Ambulation/Gait assistance: Min guard Gait Distance (Feet): 180 Feet Assistive device: Rolling walker (2 wheels) Gait Pattern/deviations: Decreased step length - left, Decreased stride length, Decreased dorsiflexion - left Gait velocity: Significantly decreased gait speed     General Gait Details: Pt with decreased L hip/knee flexion during swing phase, decreased dorsiflexion but this improves as pt continues to ambulate.   Stairs             Wheelchair Mobility    Modified Rankin (Stroke Patients Only)       Balance Overall balance assessment: Needs assistance Sitting-balance support: No upper extremity supported Sitting balance-Leahy Scale: Good     Standing balance support: Bilateral upper extremity supported, Reliant on assistive device for balance Standing balance-Leahy Scale: Fair                              Cognition Arousal/Alertness: Awake/alert Behavior During Therapy: Flat affect Overall Cognitive Status: Within Functional Limits for tasks assessed  Exercises Total Joint Exercises Quad Sets: Strengthening, AROM, Left, 10 reps, Supine Heel Slides: AAROM, Strengthening, Supine, Left, 10 reps Straight Leg Raises: AAROM, Supine, Strengthening, Left, 10 reps Long Arc Quad: AAROM, Strengthening, Seated, Left, 5 reps    General Comments General  comments (skin integrity, edema, etc.): Pt c/o slight nausea at end of gait.      Pertinent Vitals/Pain Pain Assessment Pain Assessment: 0-10 Pain Score: 6  Pain Location: L knee Pain Descriptors / Indicators: Discomfort Pain Intervention(s): Premedicated before session, Monitored during session (polar care donned at end of session)    Home Living                          Prior Function            PT Goals (current goals can now be found in the care plan section) Acute Rehab PT Goals Patient Stated Goal: go home tomorrow PT Goal Formulation: With patient Time For Goal Achievement: 01/27/22 Potential to Achieve Goals: Good Progress towards PT goals: Progressing toward goals    Frequency    BID      PT Plan Current plan remains appropriate    Co-evaluation              AM-PAC PT "6 Clicks" Mobility   Outcome Measure  Help needed turning from your back to your side while in a flat bed without using bedrails?: None Help needed moving from lying on your back to sitting on the side of a flat bed without using bedrails?: A Lot Help needed moving to and from a bed to a chair (including a wheelchair)?: A Little Help needed standing up from a chair using your arms (e.g., wheelchair or bedside chair)?: A Lot Help needed to walk in hospital room?: A Little Help needed climbing 3-5 steps with a railing? : A Lot 6 Click Score: 16    End of Session   Activity Tolerance: Patient tolerated treatment well;Patient limited by pain;Patient limited by fatigue Patient left: in chair;with chair alarm set;with call bell/phone within reach;with nursing/sitter in room Nurse Communication: Mobility status PT Visit Diagnosis: Muscle weakness (generalized) (M62.81);Difficulty in walking, not elsewhere classified (R26.2);Pain Pain - Right/Left: Left Pain - part of body: Knee     Time: 2595-6387 PT Time Calculation (min) (ACUTE ONLY): 36 min  Charges:  $Therapeutic  Exercise: 8-22 mins $Therapeutic Activity: 8-22 mins                     Sheryl Miller, PT, DPT 01/16/22, 2:16 PM   Waunita Schooner 01/16/2022, 11:58 AM

## 2022-01-16 NOTE — Consult Note (Addendum)
Initial Consultation Note   Patient: Sheryl Miller VHQ:[id number] DOB: [birth year] PCP: Teodora Medici, DO DOA: 01/14/2022 DOS: the patient was seen and examined on 01/16/2022 Primary service: Thornton Park, MD  Referring physician: Dr. Mack Guise Reason for consult: electrolyte abnormalities  Assessment/Plan:  S/P total knee arthroplasty, left: POD 2. Has tolerated pain in the surgical site. -Pain management per primary Ortho team -Patient is on as needed Tylenol, as needed Dilaudid, as needed oxycodone -As needed Robaxin -Patient is on subcutaneous Lovenox for DVT prophylaxis   Hypocalcemia(improving s/p replacement): Ca++ 6.4>8.9. Vitamin D and PTH levels WNL - check albumin level -Continue home vitamin D supplement -continue Os-Cal supplement - continue to monitor   Hypokalemia (resolved s/p replacement): K+ 3.3>4.5. Mg++ and Phos levels WNL -monitor and replete PRN  Hyponatremia- WNL yesterday and was on isotonic maintenance fluids overnight. Am Na+ 128. Stat repeat labs to verify showed Na+ 126. Patient denies dizziness, nausea, confusion or any other symptoms. GFR >60 - consulted nephrology, appreciate your care - continue isotonic fluids, increased rate   HLD (hyperlipidemia) -Zocor   Normocytic anemia: Hemoglobin 12.1 on 01/14/2022. Stable and improved 9.6 (post-op) > 10.1 -Follow-up with CBC am   History of OP (osteoporosis) -Vitamin D and Os-cal supplement   TRH will continue to follow the patient.  HPI: Sheryl Miller is a 86 y.o. female with past medical history of hyperlipidemia, GERD, osteoporosis, left knee pain, who is admitted by Dr. Mack Guise of Ortho for left knee replacement 11/7.Patient was found to have hypocalcemia 6.4 post op.  No muscle cramping.  We are asked to consult.   Review of Systems: As mentioned in the history of present illness. All other systems reviewed and are negative. Past Medical History:  Diagnosis Date   Allergic  rhinitis, cause unspecified    Breast cyst    Diverticulosis of colon (without mention of hemorrhage)    Esophageal reflux    Female stress incontinence    Internal hemorrhoids without mention of complication    Osteoporosis, unspecified    Other and unspecified hyperlipidemia    Raynaud's syndrome    Rheumatic fever    Unspecified constipation    Past Surgical History:  Procedure Laterality Date   ABDOMINAL HYSTERECTOMY     BLADDER REPAIR     BREAST BIOPSY Right 2000   fibrocystic disease   KNEE ARTHROSCOPY Right ~5/16   ROTATOR CUFF REPAIR  2006   SHOULDER ARTHROSCOPY WITH ROTATOR CUFF REPAIR AND SUBACROMIAL DECOMPRESSION  2003   R, Scott Dean   TONSILLECTOMY     TONSILLECTOMY     TOTAL KNEE ARTHROPLASTY Left 01/14/2022   Procedure: TOTAL KNEE ARTHROPLASTY;  Surgeon: Thornton Park, MD;  Location: [facility] ORS;  Service: Orthopedics;  Laterality: Left;   TUBAL LIGATION     Social History:  reports that she has never smoked. She has never used smokeless tobacco. She reports current alcohol use. She reports that she does not use drugs.  No Known Allergies  Family History  Problem Relation Age of Onset   Coronary artery disease Mother    Heart disease Mother    Diabetes Mother    COPD Father    Breast cancer Sister        2 (age 60?, age 34?); "Gene pos"   Diabetes Brother    Breast cancer Maternal Aunt        2   Stroke Paternal Grandmother    Diabetes Brother    Breast cancer Sister  Breast cancer Daughter        ? at age 34   Breast cancer Maternal Aunt    Breast cancer Niece    Breast cancer Niece     Prior to Admission medications   Medication Sig Start Date End Date Taking? Authorizing Provider  Ascorbic Acid (VITAMIN C) 1000 MG tablet Take 1,000 mg by mouth daily.   Yes [provider]  brimonidine (ALPHAGAN) 0.2 % ophthalmic solution Place 1 drop into both eyes 2 (two) times daily. 08/03/19  Yes [provider]  diclofenac Sodium  (VOLTAREN) 1 % GEL Apply 2 g topically daily as needed.   Yes [provider]  fexofenadine (ALLEGRA) 180 MG tablet Take 180 mg by mouth daily.   Yes [provider]  gabapentin (NEURONTIN) 100 MG capsule Take 100 mg by mouth daily as needed.   Yes [provider]  ketoconazole (NIZORAL) 2 % shampoo MASSAGE INTO SCALP EVERY OTHER DAY, LET SIT SEVERAL MINUTES BEFORE RINSING. 09/30/21  Yes Brendolyn Patty, MD  melatonin 5 MG TABS Take 5 mg by mouth at bedtime.   Yes [provider]  mometasone (ELOCON) 0.1 % lotion APPLY TO AFFECTED AREAS OF SCALP TWICE DAILY UNTIL IMPROVED AND AS NEEDED FOR RECURRENCE. 01/07/22  Yes Ralene Bathe, MD  Multiple Vitamins-Minerals (CENTRUM SILVER ULTRA WOMENS) TABS Take 1 tablet by mouth daily.    Yes [provider]  Probiotic Product (PROBIOTIC-10 PO) Take 1 capsule by mouth daily.   Yes [provider]  simvastatin (ZOCOR) 40 MG tablet TAKE 1 TABLET AT BEDTIME 04/22/21  Yes Viviana Simpler I, MD  timolol (TIMOPTIC) 0.5 % ophthalmic solution Place 1 drop into both eyes 2 (two) times daily. 07/26/19  Yes [provider]  traZODone (DESYREL) 150 MG tablet TAKE 1 TABLET AT BEDTIME AS NEEDED  FOR  SLEEP 03/18/21  Yes Viviana Simpler I, MD  TURMERIC PO Take 500 mg by mouth daily.   Yes [provider]  Ubiquinone (ULTRA COQ10 PO) Take 1 capsule by mouth daily.   Yes [provider]  vitamin D3 (CHOLECALCIFEROL) 25 MCG tablet Take 1,000 Units by mouth daily.   Yes [provider]    Physical Exam: Vitals:   01/15/22 0701 01/15/22 0829 01/15/22 1548 01/15/22 2325  BP: (!) 121/54 (!) 143/76 (!) 151/60 131/62  Pulse: 68  71 81  Resp: '17  16 17  '$ Temp: 98 F (36.7 C)  97.9 F (36.6 C) 98.6 F (37 C)  TempSrc: Oral     SpO2:   92% 92%  Weight:      Height:       General: NAD, pleasant, able to participate in exam Cardiac: RRR, normal heart sounds, no murmurs. 2+ radial and PT  pulses bilaterally Respiratory: CTAB, normal effort, No wheezes, rales or rhonchi Extremities: left knee in brace. Mild edema locally. No generalized edema Skin: warm and dry, no rashes noted Neuro: alert and oriented, no focal deficits Psych: Normal affect and mood  Data Reviewed:     Latest Ref Rng & Units 01/16/2022    8:04 AM 01/16/2022    3:07 AM 01/15/2022    3:45 AM  BMP  Glucose 70 - 99 mg/dL 127  141  92   BUN 8 - 23 mg/dL '11  11  13   '$ Creatinine 0.44 - 1.00 mg/dL 0.52  0.51  0.44   Sodium 135 - 145 mmol/L 126  128  137   Potassium 3.5 -  5.1 mmol/L 4.8  4.5  3.3   Chloride 98 - 111 mmol/L 94  95  115   CO2 22 - 32 mmol/L '26  25  22   '$ Calcium 8.9 - 10.3 mg/dL 8.8  8.9  6.4      Family Communication: one Primary team communication: discussed with primary Thank you very much for involving [place] in the care of your patient.  Author: Richarda Osmond, MD 01/16/2022 7:42 AM  For on call review [email or web address].

## 2022-01-16 NOTE — Consult Note (Signed)
[facility]  CONSULT NOTE    Date: 01/16/2022                  Patient Name:  Sheryl Miller  MRN: [id number]  DOB: 1935/03/15  Age / Sex: 86 y.o., female         PCP: Teodora Medici, DO                 Service Requesting Consult: TRH                 Reason for Consult: Hyponatremia            History of Present Illness: Ms. Renelda Kilian Cassaro is a 86 y.o.  female with past medical conditions including left knee osteoarthritis, osteoporosis, diverticulitis, Raynaud's syndrome, who was admitted to [facility] on 01/14/2022 for S/P total knee arthroplasty, left [Z96.652]  Patient presents to the hospital with scheduled procedure, left knee arthroplasty.  Procedure went well however patient experienced prolonged nausea and vomiting after procedure.  Patient seen sitting up in chair today, no family at bedside.  Alert and oriented.  Patient states she has continued to have poor oral intake due to nausea.  She confirms she is able to maintain fluid intake.  No lower extremity edema and right lower leg mild edema in the left leg, possibly due to surgical procedure.  Remains on room air.  Reports appropriate appetite prior to admission.  States she suffers from dry mouth at baseline  Labs on arrival significant for potassium 3.3 and calcium 6.4 with hemoglobin 9.6.  Sodium on arrival 137.  Sodium 128 this morning with recheck of 126.  Potassium and calcium supplemented.  UA appears hazy but negative.   Medications Outpatient medications: Medications Prior to Admission  Medication Sig Dispense Refill Last Dose   Ascorbic Acid (VITAMIN C) 1000 MG tablet Take 1,000 mg by mouth daily.   Past Week   brimonidine (ALPHAGAN) 0.2 % ophthalmic solution Place 1 drop into both eyes 2 (two) times daily.   01/13/2022   diclofenac Sodium (VOLTAREN) 1 % GEL Apply 2 g topically daily as needed.   Past Week   fexofenadine (ALLEGRA) 180 MG tablet Take 180 mg by mouth daily.   Past Week    gabapentin (NEURONTIN) 100 MG capsule Take 100 mg by mouth daily as needed.   01/13/2022   ketoconazole (NIZORAL) 2 % shampoo MASSAGE INTO SCALP EVERY OTHER DAY, LET SIT SEVERAL MINUTES BEFORE RINSING. 120 mL 3 Past Week   melatonin 5 MG TABS Take 5 mg by mouth at bedtime.   Past Week   mometasone (ELOCON) 0.1 % lotion APPLY TO AFFECTED AREAS OF SCALP TWICE DAILY UNTIL IMPROVED AND AS NEEDED FOR RECURRENCE. 60 mL 10 Past Week   Multiple Vitamins-Minerals (CENTRUM SILVER ULTRA WOMENS) TABS Take 1 tablet by mouth daily.    Past Week   Probiotic Product (PROBIOTIC-10 PO) Take 1 capsule by mouth daily.   Past Week   simvastatin (ZOCOR) 40 MG tablet TAKE 1 TABLET AT BEDTIME 90 tablet 3 01/13/2022   timolol (TIMOPTIC) 0.5 % ophthalmic solution Place 1 drop into both eyes 2 (two) times daily.   01/13/2022   traZODone (DESYREL) 150 MG tablet TAKE 1 TABLET AT BEDTIME AS NEEDED  FOR  SLEEP 90 tablet 3 01/13/2022   TURMERIC PO Take 500 mg by mouth daily.   Past Week   Ubiquinone (ULTRA COQ10 PO) Take 1 capsule by mouth daily.   Past Week  vitamin D3 (CHOLECALCIFEROL) 25 MCG tablet Take 1,000 Units by mouth daily.   Past Week    Current medications: Current Facility-Administered Medications  Medication Dose Route Frequency Provider Last Rate Last Admin   acetaminophen (TYLENOL) tablet 325-650 mg  325-650 mg Oral Q6H PRN Thornton Park, MD   650 mg at 01/16/22 1301   acidophilus (RISAQUAD) capsule 1 capsule  1 capsule Oral Daily Thornton Park, MD   1 capsule at 01/16/22 0924   bisacodyl (DULCOLAX) EC tablet 5 mg  5 mg Oral Daily PRN Thornton Park, MD       brimonidine (ALPHAGAN) 0.2 % ophthalmic solution 1 drop  1 drop Both Eyes BID Thornton Park, MD   1 drop at 01/16/22 0924   calcium carbonate (OS-CAL - dosed in mg of elemental calcium) tablet 1,250 mg  1,250 mg Oral TID WC Ivor Costa, MD   1,250 mg at 01/16/22 1153   cholecalciferol (VITAMIN D3) 25 MCG (1000 UNIT) tablet 1,000 Units  1,000  Units Oral Daily Ivor Costa, MD   1,000 Units at 01/16/22 0924   enoxaparin (LOVENOX) injection 30 mg  30 mg Subcutaneous BID Alison Murray, RPH   30 mg at 01/16/22 8119   gabapentin (NEURONTIN) capsule 100 mg  100 mg Oral Daily PRN Thornton Park, MD       HYDROmorphone (DILAUDID) injection 0.5 mg  0.5 mg Intravenous Q2H PRN Thornton Park, MD   0.5 mg at 01/15/22 1478   melatonin tablet 5 mg  5 mg Oral QHS Thornton Park, MD   5 mg at 01/15/22 2102   methocarbamol (ROBAXIN) tablet 500 mg  500 mg Oral Q6H PRN Thornton Park, MD   500 mg at 01/16/22 0850   Or   methocarbamol (ROBAXIN) 500 mg in dextrose 5 % 50 mL IVPB  500 mg Intravenous Q6H PRN Thornton Park, MD       metoCLOPramide (REGLAN) tablet 5-10 mg  5-10 mg Oral Q8H PRN Thornton Park, MD       Or   metoCLOPramide (REGLAN) injection 5-10 mg  5-10 mg Intravenous Q8H PRN Thornton Park, MD       multivitamin with minerals tablet 1 tablet  1 tablet Oral Daily Thornton Park, MD   1 tablet at 01/16/22 0925   ondansetron (ZOFRAN) tablet 4 mg  4 mg Oral Q6H PRN Thornton Park, MD       Or   ondansetron [facility]) injection 4 mg  4 mg Intravenous Q6H PRN Thornton Park, MD   4 mg at 01/16/22 1158   oxyCODONE (Oxy IR/ROXICODONE) immediate release tablet 10-15 mg  10-15 mg Oral Q4H PRN Thornton Park, MD       oxyCODONE (Oxy IR/ROXICODONE) immediate release tablet 5-10 mg  5-10 mg Oral Q4H PRN Thornton Park, MD   5 mg at 01/16/22 1301   simvastatin (ZOCOR) tablet 40 mg  40 mg Oral QHS Thornton Park, MD   40 mg at 01/15/22 2102   sodium phosphate (FLEET) 7-19 GM/118ML enema 1 enema  1 enema Rectal Once PRN Thornton Park, MD       timolol (TIMOPTIC) 0.5 % ophthalmic solution 1 drop  1 drop Both Eyes BID Thornton Park, MD   1 drop at 01/16/22 0926   tolvaptan (SAMSCA) tablet 15 mg  15 mg Oral Once Colon Flattery, NP       traMADol (ULTRAM) tablet 50 mg  50 mg Oral Q6H Thornton Park, MD   50 mg at 01/16/22  1153  traZODone (DESYREL) tablet 150 mg  150 mg Oral QHS PRN Thornton Park, MD          Allergies: No Known Allergies    Past Medical History: Past Medical History:  Diagnosis Date   Allergic rhinitis, cause unspecified    Breast cyst    Diverticulosis of colon (without mention of hemorrhage)    Esophageal reflux    Female stress incontinence    Internal hemorrhoids without mention of complication    Osteoporosis, unspecified    Other and unspecified hyperlipidemia    Raynaud's syndrome    Rheumatic fever    Unspecified constipation      Past Surgical History: Past Surgical History:  Procedure Laterality Date   ABDOMINAL HYSTERECTOMY     BLADDER REPAIR     BREAST BIOPSY Right 2000   fibrocystic disease   KNEE ARTHROSCOPY Right ~5/16   ROTATOR CUFF REPAIR  2006   SHOULDER ARTHROSCOPY WITH ROTATOR CUFF REPAIR AND SUBACROMIAL DECOMPRESSION  2003   R, Scott Dean   TONSILLECTOMY     TONSILLECTOMY     TOTAL KNEE ARTHROPLASTY Left 01/14/2022   Procedure: TOTAL KNEE ARTHROPLASTY;  Surgeon: Thornton Park, MD;  Location: [facility] ORS;  Service: Orthopedics;  Laterality: Left;   TUBAL LIGATION       Family History: Family History  Problem Relation Age of Onset   Coronary artery disease Mother    Heart disease Mother    Diabetes Mother    COPD Father    Breast cancer Sister        2 (age 67?, age 48?); "Gene pos"   Diabetes Brother    Breast cancer Maternal Aunt        2   Stroke Paternal Grandmother    Diabetes Brother    Breast cancer Sister    Breast cancer Daughter        ? at age 45   Breast cancer Maternal Aunt    Breast cancer Niece    Breast cancer Niece      Social History: Social History   Socioeconomic History   Marital status: Married    Spouse name: Juanda Crumble   Number of children: 4   Years of education: Not on file   Highest education level: Not on file  Occupational History   Occupation: retired Scientist, clinical (histocompatibility and immunogenetics): RETIRED  Tobacco Use    Smoking status: Never   Smokeless tobacco: Never  Vaping Use   Vaping Use: Never used  Substance and Sexual Activity   Alcohol use: Yes    Comment: wine of sunday   Drug use: No   Sexual activity: Yes    Birth control/protection: Surgical  Other Topics Concern   Not on file  Social History Narrative   Regular exercise-yes, walking or rides stationery bike daily      Has living will.    Husband should make health care decisions as needed, then son Herbie [place].    Would accept resuscitation but no prolonged ventilation.    Would not want feeding tube for extended time if not cognitively aware            Social Determinants of Health   Financial Resource Strain: Not on file  Food Insecurity: No Food Insecurity (01/14/2022)   Hunger Vital Sign    Worried About Running Out of Food in the Last Year: Never true    Ran Out of Food in the Last Year: Never true  Transportation Needs: No Transportation Needs (01/14/2022)  PRAPARE - Hydrologist (Medical): No    Lack of Transportation (Non-Medical): No  Physical Activity: Not on file  Stress: Not on file  Social Connections: Not on file  Intimate Partner Violence: Not At Risk (01/14/2022)   Humiliation, Afraid, Rape, and Kick questionnaire    Fear of Current or Ex-Partner: No    Emotionally Abused: No    Physically Abused: No    Sexually Abused: No     Review of Systems: Review of Systems  Constitutional:  Negative for chills, fever and malaise/fatigue.  HENT:  Negative for congestion, sore throat and tinnitus.   Eyes:  Negative for blurred vision and redness.  Respiratory:  Negative for cough, shortness of breath and wheezing.   Cardiovascular:  Negative for chest pain, palpitations, claudication and leg swelling.  Gastrointestinal:  Negative for abdominal pain, blood in stool, diarrhea, nausea and vomiting.  Genitourinary:  Negative for flank pain, frequency and hematuria.  Musculoskeletal:   Negative for back pain, falls and myalgias.  Skin:  Negative for rash.  Neurological:  Negative for dizziness, weakness and headaches.  Endo/Heme/Allergies:  Does not bruise/bleed easily.  Psychiatric/Behavioral:  Negative for depression. The patient is not nervous/anxious and does not have insomnia.     Vital Signs: Blood pressure (!) 161/76, pulse 86, temperature 98 F (36.7 C), resp. rate 17, height '5\' 2"'$  (1.575 m), weight 71.2 kg, SpO2 95 %.  Weight trends: Filed Weights   01/14/22 1003  Weight: 71.2 kg    Physical Exam: General: NAD  Head: Normocephalic, atraumatic. Moist oral mucosal membranes  Eyes: Anicteric  Lungs:  Clear to auscultation  Heart: Regular rate and rhythm  Abdomen:  Soft, nontender  Extremities: Trace peripheral edema.  Right knee surgery  Neurologic: Nonfocal, moving all four extremities  Skin: No lesions, right knee surgical incision  Access: None     Lab results: Basic Metabolic Panel: Recent Labs  Lab 01/15/22 0345 01/15/22 0752 01/16/22 0307 01/16/22 0804  NA 137  --  128* 126*  K 3.3*  --  4.5 4.8  CL 115*  --  95* 94*  CO2 22  --  25 26  GLUCOSE 92  --  141* 127*  BUN 13  --  11 11  CREATININE 0.44  --  0.51 0.52  CALCIUM 6.4*  --  8.9 8.8*  MG  --  1.9  --   --   PHOS  --  3.4  --   --     Liver Function Tests: No results for input(s): "AST", "ALT", "ALKPHOS", "BILITOT", "PROT", "ALBUMIN" in the last 168 hours. No results for input(s): "LIPASE", "AMYLASE" in the last 168 hours. No results for input(s): "AMMONIA" in the last 168 hours.  CBC: Recent Labs  Lab 01/14/22 1656 01/15/22 0345 01/16/22 0307  WBC 6.1 6.5 8.8  HGB 12.1 9.6* 10.1*  HCT 35.8* 28.2* 29.9*  MCV 89.9 90.4 89.0  PLT 233 190 217    Cardiac Enzymes: No results for input(s): "CKTOTAL", "CKMB", "CKMBINDEX", "TROPONINI" in the last 168 hours.  BNP: Invalid input(s): "POCBNP"  CBG: No results for input(s): "GLUCAP" in the last 168  hours.  Microbiology: Results for orders placed or performed during the hospital encounter of 01/06/22  Surgical pcr screen     Status: None   Collection Time: 01/06/22 10:03 AM   Specimen: Nasal Mucosa; Nasal Swab  Result Value Ref Range Status   MRSA, PCR NEGATIVE NEGATIVE Final   Staphylococcus aureus  NEGATIVE NEGATIVE Final    Comment: (NOTE) The Xpert SA Assay (FDA approved for NASAL specimens in patients 52 years of age and older), is one component of a comprehensive surveillance program. It is not intended to diagnose infection nor to guide or monitor treatment. Performed at [facility], [address] [place], [place] [address]     Coagulation Studies: No results for input(s): "LABPROT", "INR" in the last 72 hours.  Urinalysis: No results for input(s): "COLORURINE", "LABSPEC", "PHURINE", "GLUCOSEU", "HGBUR", "BILIRUBINUR", "KETONESUR", "PROTEINUR", "UROBILINOGEN", "NITRITE", "LEUKOCYTESUR" in the last 72 hours.  Invalid input(s): "APPERANCEUR"    Imaging: DG Knee Left Port  Result Date: 01/14/2022 CLINICAL DATA:  Follow-up total knee arthroplasty EXAM: PORTABLE LEFT KNEE - 1-2 VIEW COMPARISON:  None Available. FINDINGS: Total knee arthroplasty components appear well positioned. No unexpected finding. IMPRESSION: Good appearance following total knee arthroplasty. Electronically Signed   By: Nelson Chimes M.D.   On: 01/14/2022 14:29     Assessment & Plan: Ms. Erion Hermans Tirado is a 86 y.o.  female with past medical conditions including left knee osteoarthritis, osteoporosis, diverticulitis, Raynaud's syndrome, who was admitted to [facility] on 01/14/2022 for S/P total knee arthroplasty, left [Z96.652]  Hyponatremia likely due to poor oral intake, possible SIADH component.  Not responding to IV fluids.  Poor oral intake persists.  We will dose tolvaptan 15 mg once.  Will re-evaluate sodium levels in am. Continue supportive care.  2.  Anemia likely due to recent  surgery. Hgb 10.1.EBL minimal. Will continue to monitor.   LOS: 0 Elena Davia 11/9/20231:23 PM

## 2022-01-17 DIAGNOSIS — Z96652 Presence of left artificial knee joint: Secondary | ICD-10-CM | POA: Diagnosis not present

## 2022-01-17 DIAGNOSIS — E876 Hypokalemia: Secondary | ICD-10-CM | POA: Diagnosis not present

## 2022-01-17 DIAGNOSIS — E871 Hypo-osmolality and hyponatremia: Secondary | ICD-10-CM | POA: Diagnosis not present

## 2022-01-17 LAB — BASIC METABOLIC PANEL
Anion gap: 5 (ref 5–15)
Anion gap: 8 (ref 5–15)
BUN: 9 mg/dL (ref 8–23)
BUN: 10 mg/dL (ref 8–23)
CO2: 28 mmol/L (ref 22–32)
CO2: 30 mmol/L (ref 22–32)
Calcium: 8.9 mg/dL (ref 8.9–10.3)
Calcium: 9.1 mg/dL (ref 8.9–10.3)
Chloride: 96 mmol/L — ABNORMAL LOW (ref 98–111)
Chloride: 95 mmol/L — ABNORMAL LOW (ref 98–111)
Creatinine, Ser: 0.59 mg/dL (ref 0.44–1.00)
Creatinine, Ser: 0.6 mg/dL (ref 0.44–1.00)
GFR, Estimated: >60 mL/min (ref >60)
GFR, Estimated: >60 mL/min (ref >60)
Glucose, Bld: 114 mg/dL — ABNORMAL HIGH (ref 70–99)
Glucose, Bld: 155 mg/dL — ABNORMAL HIGH (ref 70–99)
Potassium: 4.6 mmol/L (ref 3.5–5.1)
Potassium: 4.3 mmol/L (ref 3.5–5.1)
Sodium: 129 mmol/L — ABNORMAL LOW (ref 135–145)
Sodium: 133 mmol/L — ABNORMAL LOW (ref 135–145)

## 2022-01-17 LAB — SODIUM: Sodium: 129 mmol/L — ABNORMAL LOW (ref 135–145)

## 2022-01-17 MED ORDER — SODIUM CHLORIDE 0.9 % IV SOLN: INTRAVENOUS | Status: DC

## 2022-01-17 NOTE — Progress Notes (Signed)
MEDICATION RELATED CONSULT NOTE - INITIAL   Pharmacy Consult for tolvaptan Indication: hyponatremia   No Known Allergies  Patient Measurements: Height: '5\' 2"'$  (157.5 cm) Weight: 71.2 kg (156 lb 15.5 oz) IBW/kg (Calculated) : 50.1  Vital Signs: Temp: 97.9 F (36.6 C) (11/10 0508) BP: 133/60 (11/10 0508) Pulse Rate: 87 (11/10 0508) Intake/Output from previous day: 11/09 0701 - 11/10 0700 In: 390.8 [P.O.:340; I.V.:50.8] Out: -  Intake/Output from this shift: No intake/output data recorded.  Labs: Recent Labs    01/14/22 1656 01/15/22 0345 01/15/22 0752 01/16/22 0307 01/16/22 0804 01/16/22 1534 01/16/22 2134  WBC 6.1 6.5  --  8.8  --   --   --   HGB 12.1 9.6*  --  10.1*  --   --   --   HCT 35.8* 28.2*  --  29.9*  --   --   --   PLT 233 190  --  217  --   --   --   CREATININE 0.61 0.44  --  0.51 0.52 0.46 0.68  MG  --   --  1.9  --   --   --   --   PHOS  --   --  3.4  --   --   --   --   ALBUMIN  --   --   --   --   --  3.0*  --   PROT  --   --   --   --   --  5.9*  --   AST  --   --   --   --   --  19  --   ALT  --   --   --   --   --  18  --   ALKPHOS  --   --   --   --   --  63  --   BILITOT  --   --   --   --   --  0.8  --   BILIDIR  --   --   --   --   --  0.1  --   IBILI  --   --   --   --   --  0.7  --     Estimated Creatinine Clearance: 48.3 mL/min (by C-G formula based on SCr of 0.68 mg/dL).   Microbiology: Recent Results (from the past 720 hour(s))  Surgical pcr screen     Status: None   Collection Time: 01/06/22 10:03 AM   Specimen: Nasal Mucosa; Nasal Swab  Result Value Ref Range Status   MRSA, PCR NEGATIVE NEGATIVE Final   Staphylococcus aureus NEGATIVE NEGATIVE Final    Comment: (NOTE) The Xpert SA Assay (FDA approved for NASAL specimens in patients 64 years of age and older), is one component of a comprehensive surveillance program. It is not intended to diagnose infection nor to guide or monitor treatment. Performed at [facility], [address], [place], [place] [address]     Medical History: Past Medical History:  Diagnosis Date   Allergic rhinitis, cause unspecified    Breast cyst    Diverticulosis of colon (without mention of hemorrhage)    Esophageal reflux    Female stress incontinence    Internal hemorrhoids without mention of complication    Osteoporosis, unspecified    Other and unspecified hyperlipidemia    Raynaud's syndrome    Rheumatic fever    Unspecified constipation  Assessment: 86 year old patient admitted with left knee osteoarthritis s/p left total knee arthroplasty 11/7. Past medical history includes osteoporosis (not currently on treatment per my chart review), diverticulitis, Raynaud's syndrome. During admission, sodium acutely declining from 137 >126 despite receiving 0.9% sodium chloride IVF '@75'$  mL/hr from 01/14/22 - 01/16/22. 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  CrCl > 10 mL/min  11/09 @ 1534:  Na = 122 11/09 @ 2134:  Na = 131 11/10 @ 0612:  Na = 129   Goal of Therapy:  Na > 130 (per d/w nephrology NP)   Plan: tolvaptan 15 mg x 1 (order timed for 11/9 '@1500'$ ) - first dose Monitor serum Na every 8 hours x 24 hours then daily.  Contact provider (nephrology consulted) if Na exceeds 8 mEq/L per 8 hours or 12 mEq/L in 24 hours Monitor renal function daily. CTM I&O.   11/09:  Na @ 2134 = 131 ; Represents a rise of 9 mmol/L in 6 hrs. Contacted Dr Holley Raring with results, he suspects Na @ 1534 was erroneous and would like to continue monitoring in AM.   11/10:  Na @ 0612 = 129 - Will recheck Na on 11/10 @ 1400.   Kimberley Speece D 01/17/2022,7:01 AM

## 2022-01-17 NOTE — NC FL2 (Signed)
[place] LEVEL OF CARE SCREENING TOOL     IDENTIFICATION  Patient Name: Sheryl Miller Birthdate: [birth year] Sex: female Admission Date (Current Location): 01/14/2022  [facility] and [place] Number:  Engineering geologist and Address:  [facility], [address], [place], [place] [address]      Provider Number: [id number]  Attending Physician Name and Address:  Thornton Park, MD  Relative Name and Phone Number:  Juanda Crumble [phone]    Current Level of Care: Hospital Recommended Level of Care: [place] Prior Approval Number:    Date Approved/Denied:   PASRR Number: [id number]  Discharge Plan: SNF    Current Diagnoses: Patient Active Problem List   Diagnosis Date Noted   Hyponatremia 01/16/2022   HLD (hyperlipidemia) 01/15/2022   Hypocalcemia 01/15/2022   Hypokalemia 01/15/2022   Normocytic anemia 01/15/2022   OP (osteoporosis) 01/15/2022   S/P total knee arthroplasty, left 01/14/2022   Lumbar back pain 07/15/2021   Neuropathy 10/29/2018   Generalized osteoarthritis 07/10/2017   Advance directive discussed with patient 02/13/2016   Family history of breast cancer 07/20/2015   Hyperlipemia 02/12/2015   Sleep disturbance 04/20/2014   Balance problem 01/26/2013   Routine general medical examination at a health care facility 08/16/2010   PES PLANUS 02/11/2010   UNEQUAL LEG LENGTH 02/11/2010   VARICOSE VEINS, LOWER EXTREMITIES 12/01/2007   Osteoporosis 06/24/2007   HEMORRHOIDS, INTERNAL 06/07/2007   Chronic constipation 06/07/2007   GERD 02/11/2007   Allergic rhinitis due to pollen 07/09/2006    Orientation RESPIRATION BLADDER Height & Weight     Self, Time, Situation, Place  Normal Continent Weight: 71.2 kg Height:  '5\' 2"'$  (157.5 cm)  BEHAVIORAL SYMPTOMS/MOOD NEUROLOGICAL BOWEL NUTRITION STATUS      Continent Diet (See dc summary)  AMBULATORY STATUS COMMUNICATION OF NEEDS Skin   Extensive Assist  Verbally Normal, Surgical wounds                       Personal Care Assistance Level of Assistance  Bathing, Feeding, Dressing Bathing Assistance: Limited assistance Feeding assistance: Independent Dressing Assistance: Limited assistance     Functional Limitations Info             SPECIAL CARE FACTORS FREQUENCY  PT (By licensed PT), OT (By licensed OT)     PT Frequency: 5 times per week OT Frequency: 5 times per week            Contractures Contractures Info: Not present    Additional Factors Info  Code Status, Allergies Code Status Info: Full code Allergies Info: NKDA           Current Medications (01/17/2022):  This is the current hospital active medication list Current Facility-Administered Medications  Medication Dose Route Frequency Provider Last Rate Last Admin   0.9 %  sodium chloride infusion   Intravenous Continuous Richarda Osmond, MD       acetaminophen (TYLENOL) tablet 325-650 mg  325-650 mg Oral Q6H PRN Thornton Park, MD   650 mg at 01/16/22 1301   acidophilus (RISAQUAD) capsule 1 capsule  1 capsule Oral Daily Thornton Park, MD   1 capsule at 01/16/22 0924   bisacodyl (DULCOLAX) EC tablet 5 mg  5 mg Oral Daily PRN Thornton Park, MD       brimonidine (ALPHAGAN) 0.2 % ophthalmic solution 1 drop  1 drop Both Eyes BID Thornton Park, MD   1 drop at 01/16/22 2128   calcium carbonate (  OS-CAL - dosed in mg of elemental calcium) tablet 1,250 mg  1,250 mg Oral TID WC Ivor Costa, MD   1,250 mg at 01/16/22 1802   cholecalciferol (VITAMIN D3) 25 MCG (1000 UNIT) tablet 1,000 Units  1,000 Units Oral Daily Ivor Costa, MD   1,000 Units at 01/16/22 0924   enoxaparin (LOVENOX) injection 30 mg  30 mg Subcutaneous BID Alison Murray, RPH   30 mg at 01/16/22 2128   gabapentin (NEURONTIN) capsule 100 mg  100 mg Oral Daily PRN Thornton Park, MD       HYDROmorphone (DILAUDID) injection 0.5 mg  0.5 mg Intravenous Q2H PRN Thornton Park, MD   0.5 mg at  01/15/22 9741   melatonin tablet 5 mg  5 mg Oral QHS Thornton Park, MD   5 mg at 01/16/22 2128   methocarbamol (ROBAXIN) tablet 500 mg  500 mg Oral Q6H PRN Thornton Park, MD   500 mg at 01/16/22 0850   Or   methocarbamol (ROBAXIN) 500 mg in dextrose 5 % 50 mL IVPB  500 mg Intravenous Q6H PRN Thornton Park, MD       metoCLOPramide (REGLAN) tablet 5-10 mg  5-10 mg Oral Q8H PRN Thornton Park, MD       Or   metoCLOPramide (REGLAN) injection 5-10 mg  5-10 mg Intravenous Q8H PRN Thornton Park, MD       multivitamin with minerals tablet 1 tablet  1 tablet Oral Daily Thornton Park, MD   1 tablet at 01/16/22 0925   ondansetron (ZOFRAN) tablet 4 mg  4 mg Oral Q6H PRN Thornton Park, MD       Or   ondansetron [facility]) injection 4 mg  4 mg Intravenous Q6H PRN Thornton Park, MD   4 mg at 01/16/22 1158   oxyCODONE (Oxy IR/ROXICODONE) immediate release tablet 10-15 mg  10-15 mg Oral Q4H PRN Thornton Park, MD       oxyCODONE (Oxy IR/ROXICODONE) immediate release tablet 5-10 mg  5-10 mg Oral Q4H PRN Thornton Park, MD   5 mg at 01/16/22 1301   simvastatin (ZOCOR) tablet 40 mg  40 mg Oral QHS Thornton Park, MD   40 mg at 01/16/22 2128   sodium phosphate (FLEET) 7-19 GM/118ML enema 1 enema  1 enema Rectal Once PRN Thornton Park, MD       timolol (TIMOPTIC) 0.5 % ophthalmic solution 1 drop  1 drop Both Eyes BID Thornton Park, MD   1 drop at 01/16/22 2128   traMADol (ULTRAM) tablet 50 mg  50 mg Oral Q6H Thornton Park, MD   50 mg at 01/17/22 0504   traZODone (DESYREL) tablet 150 mg  150 mg Oral QHS PRN Thornton Park, MD         Discharge Medications: Please see discharge summary for a list of discharge medications.  Relevant Imaging Results:  Relevant Lab Results:   Additional Information SS# [id number]  Conception Oms, RN

## 2022-01-17 NOTE — Progress Notes (Signed)
     Subjective: 3 Days Post-Op Procedure(s) (LRB): TOTAL KNEE ARTHROPLASTY (Left)   Patient reports pain as moderate, controlled with medication.  States that she is still unable to lift her leg.  States that she really hasn't worked with PT much. She mentioned that she is being worked up by medicine for her electrolyte imbalance. States that she was unsure of the discharge plan.     Objective:   VITALS:   Vitals:   01/17/22 0508 01/17/22 0824  BP: 133/60 (!) 114/51  Pulse: 87 96  Resp: 16 17  Temp: 97.9 F (36.6 C) 98.3 F (36.8 C)  SpO2: 98% 94%    Neurovascular intact Dorsiflexion/Plantar flexion intact Incision: scant drainage No cellulitis present Compartment soft  LABS Recent Labs    01/14/22 1656 01/15/22 0345 01/16/22 0307  HGB 12.1 9.6* 10.1*  HCT 35.8* 28.2* 29.9*  WBC 6.1 6.5 8.8  PLT 233 190 217    Recent Labs    01/16/22 1534 01/16/22 2134 01/17/22 0612 01/17/22 0908  NA 122* 131* 129* 129*  K 4.6 4.7  --  4.6  BUN 12 10  --  9  CREATININE 0.46 0.68  --  0.59  GLUCOSE 134* 119*  --  114*     Assessment/Plan: 3 Days Post-Op Procedure(s) (LRB): TOTAL KNEE ARTHROPLASTY (Left)   Up with therapy Discharge plan to TBD depending on progress.  She appears to be slowly progressing postop with PT.  Her continues with the issue of hyponatremia.  Patient is awaiting nephrology evaluation. Patient is 86 years old and will most likely benefit from a skilled nursing facility stay.  Patient will remain on Lovenox until follow-up in the office in approximately 2 weeks.     [place]

## 2022-01-17 NOTE — Consult Note (Signed)
Initial Consultation Note   Patient: Sheryl Miller CLE:[id number] DOB: Feb 26, 1936 PCP: Teodora Medici, DO DOA: 01/14/2022 DOS: the patient was seen and examined on 01/17/2022 Primary service: Thornton Park, MD  Referring physician: Dr. Mack Guise Reason for consult: electrolyte abnormalities  Assessment/Plan:  S/P total knee arthroplasty, left 11/7: Has tolerated pain in the surgical site. -Pain management per primary Ortho team -analgesia PRN -Patient is on subcutaneous Lovenox for DVT prophylaxis   Hypocalcemia(improving s/p replacement): Ca++ 6.4>8.9. Vitamin D and PTH levels WNL. Albumin 3.0 -Continue home vitamin D supplement -continue Os-Cal supplement - continue to monitor   Hypokalemia (resolved s/p replacement): K+ 3.3>4.5. Mg++ and Phos levels WNL -monitor and replete PRN  Hyponatremia- resistant to isotonic IV fluids and one dose tolvaptan per nephrology. Patient denies dizziness, nausea, confusion or any other symptoms. GFR >60. Albumin 3.0 yesterday. SIADH workup - consulted nephrology, appreciate your care - continue isotonic fluids, increased rate   HLD (hyperlipidemia) -Zocor   Normocytic anemia: Hemoglobin 12.1 on 01/14/2022. Stable and improved 9.6 (post-op) > 10.1 -Follow-up with CBC am   History of OP (osteoporosis) -Vitamin D and Os-cal supplement   TRH will continue to follow the patient.  HPI: Sheryl Miller is a 86 y.o. female with past medical history of hyperlipidemia, GERD, osteoporosis, left knee pain, who is admitted by Dr. Mack Guise of Ortho for left knee replacement 11/7.Patient was found to have hypocalcemia 6.4 post op.  No muscle cramping.  We are asked to consult.   Patient feels overall well. Discussed different causes of hyponatremia with patient who was accepting of possibilities and would like full workup to find cause.  Review of Systems: As mentioned in the history of present illness. All other systems reviewed and are  negative. Past Medical History:  Diagnosis Date   Allergic rhinitis, cause unspecified    Breast cyst    Diverticulosis of colon (without mention of hemorrhage)    Esophageal reflux    Female stress incontinence    Internal hemorrhoids without mention of complication    Osteoporosis, unspecified    Other and unspecified hyperlipidemia    Raynaud's syndrome    Rheumatic fever    Unspecified constipation    Past Surgical History:  Procedure Laterality Date   ABDOMINAL HYSTERECTOMY     BLADDER REPAIR     BREAST BIOPSY Right 2000   fibrocystic disease   KNEE ARTHROSCOPY Right ~5/16   ROTATOR CUFF REPAIR  2006   SHOULDER ARTHROSCOPY WITH ROTATOR CUFF REPAIR AND SUBACROMIAL DECOMPRESSION  2003   R, Scott Dean   TONSILLECTOMY     TONSILLECTOMY     TOTAL KNEE ARTHROPLASTY Left 01/14/2022   Procedure: TOTAL KNEE ARTHROPLASTY;  Surgeon: Thornton Park, MD;  Location: [facility] ORS;  Service: Orthopedics;  Laterality: Left;   TUBAL LIGATION     Social History:  reports that she has never smoked. She has never used smokeless tobacco. She reports current alcohol use. She reports that she does not use drugs.  No Known Allergies  Family History  Problem Relation Age of Onset   Coronary artery disease Mother    Heart disease Mother    Diabetes Mother    COPD Father    Breast cancer Sister        2 (age 36?, age 79?); "Gene pos"   Diabetes Brother    Breast cancer Maternal Aunt        2   Stroke Paternal Grandmother    Diabetes Brother    Breast  cancer Sister    Breast cancer Daughter        ? at age 9   Breast cancer Maternal Aunt    Breast cancer Niece    Breast cancer Niece     Prior to Admission medications   Medication Sig Start Date End Date Taking? Authorizing Provider  Ascorbic Acid (VITAMIN C) 1000 MG tablet Take 1,000 mg by mouth daily.   Yes [provider]  brimonidine (ALPHAGAN) 0.2 % ophthalmic solution Place 1 drop into both eyes 2 (two) times daily.  08/03/19  Yes [provider]  diclofenac Sodium (VOLTAREN) 1 % GEL Apply 2 g topically daily as needed.   Yes [provider]  fexofenadine (ALLEGRA) 180 MG tablet Take 180 mg by mouth daily.   Yes [provider]  gabapentin (NEURONTIN) 100 MG capsule Take 100 mg by mouth daily as needed.   Yes [provider]  ketoconazole (NIZORAL) 2 % shampoo MASSAGE INTO SCALP EVERY OTHER DAY, LET SIT SEVERAL MINUTES BEFORE RINSING. 09/30/21  Yes Brendolyn Patty, MD  melatonin 5 MG TABS Take 5 mg by mouth at bedtime.   Yes [provider]  mometasone (ELOCON) 0.1 % lotion APPLY TO AFFECTED AREAS OF SCALP TWICE DAILY UNTIL IMPROVED AND AS NEEDED FOR RECURRENCE. 01/07/22  Yes Ralene Bathe, MD  Multiple Vitamins-Minerals (CENTRUM SILVER ULTRA WOMENS) TABS Take 1 tablet by mouth daily.    Yes [provider]  Probiotic Product (PROBIOTIC-10 PO) Take 1 capsule by mouth daily.   Yes [provider]  simvastatin (ZOCOR) 40 MG tablet TAKE 1 TABLET AT BEDTIME 04/22/21  Yes Viviana Simpler I, MD  timolol (TIMOPTIC) 0.5 % ophthalmic solution Place 1 drop into both eyes 2 (two) times daily. 07/26/19  Yes [provider]  traZODone (DESYREL) 150 MG tablet TAKE 1 TABLET AT BEDTIME AS NEEDED  FOR  SLEEP 03/18/21  Yes Viviana Simpler I, MD  TURMERIC PO Take 500 mg by mouth daily.   Yes [provider]  Ubiquinone (ULTRA COQ10 PO) Take 1 capsule by mouth daily.   Yes [provider]  vitamin D3 (CHOLECALCIFEROL) 25 MCG tablet Take 1,000 Units by mouth daily.   Yes [provider]    Physical Exam: Vitals:   01/16/22 0902 01/16/22 2137 01/17/22 0508 01/17/22 0824  BP: (!) 161/76 (!) 117/54 133/60 (!) 114/51  Pulse: 86 78 87 96  Resp: '17 17 16 17  '$ Temp: 98 F (36.7 C) 98.2 F (36.8 C) 97.9 F (36.6 C) 98.3 F (36.8 C)  TempSrc:      SpO2: 95% 93% 98% 94%  Weight:      Height:       General: NAD, pleasant, able to  participate in exam Cardiac: RRR, normal heart sounds, no murmurs. 2+ radial and PT pulses bilaterally Respiratory: CTAB, normal effort, No wheezes, rales or rhonchi Extremities: left knee in brace. Mild edema locally. No generalized edema Skin: warm and dry, no rashes noted Neuro: alert and oriented, no focal deficits Psych: Normal affect and mood  Data Reviewed:     Latest Ref Rng & Units 01/17/2022    6:12 AM 01/16/2022    9:34 PM 01/16/2022    3:34 PM  BMP  Glucose 70 - 99 mg/dL  119  134   BUN 8 - 23 mg/dL  10  12   Creatinine 0.44 - 1.00 mg/dL  0.68  0.46   Sodium 135 - 145 mmol/L 129  131  122  Potassium 3.5 - 5.1 mmol/L  4.7  4.6   Chloride 98 - 111 mmol/L  95  88   CO2 22 - 32 mmol/L  30  29   Calcium 8.9 - 10.3 mg/dL  9.5  8.7      Family Communication: none Primary team communication: discussed with primary Thank you very much for involving [place] in the care of your patient.  Author: Richarda Osmond, MD 01/17/2022 9:11 AM  For on call review [email or web address].

## 2022-01-17 NOTE — Progress Notes (Signed)
Physical Therapy Treatment Patient Details Name: Sheryl Miller MRN: [id number] DOB: [birth year] Today's Date: 01/17/2022   History of Present Illness 86 y/o female s/p L TKA 11/7.    PT Comments    Pt seen for PT tx with pt agreeable to tx. Pt received in recliner with KI positioned around distal portion of LLE with PT educating that it does not assist in knee flexion in that position. Pt demonstrates improving ability to transfer STS with min assist on this date. Pt ambulates 1 lap around unit with RW & supervision but requires ~12 minutes to do so. Plan to attempt stair negotiation again during PM session & pt agreeable.   Recommendations for follow up therapy are one component of a multi-disciplinary discharge planning process, led by the attending physician.  Recommendations may be updated based on patient status, additional functional criteria and insurance authorization.  Follow Up Recommendations  Follow physician's recommendations for discharge plan and follow up therapies     Assistance Recommended at Discharge Intermittent Supervision/Assistance  Patient can return home with the following A little help with walking and/or transfers;A little help with bathing/dressing/bathroom;Assistance with cooking/housework;Assist for transportation;Help with stairs or ramp for entrance   Equipment Recommendations  None recommended by PT (per chart, pt has RW)    Recommendations for Other Services       Precautions / Restrictions Precautions Precautions: Fall Required Braces or Orthoses: Knee Immobilizer - Left Knee Immobilizer - Left: Other (comment) (while in bed) Restrictions Weight Bearing Restrictions: Yes LLE Weight Bearing: Weight bearing as tolerated     Mobility  Bed Mobility               General bed mobility comments: pt received & left in recliner (pt reports she will sleep in recliner at home)    Transfers Overall transfer level: Needs  assistance Equipment used: Rolling walker (2 wheels) Transfers: Sit to/from Stand Sit to Stand: Min assist           General transfer comment: improving ability to push to standing with UE on armrests, slight improvement in posterior lean during transfer    Ambulation/Gait Ambulation/Gait assistance: Supervision Gait Distance (Feet): 200 Feet Assistive device: Rolling walker (2 wheels) Gait Pattern/deviations: Decreased step length - right, Decreased step length - left, Decreased dorsiflexion - left, Decreased dorsiflexion - right, Decreased stride length Gait velocity: significantly decreased gait speed     General Gait Details: Pt initially with decreased weight shift LLE, decreased step length BLE, decreased gait speed but able to progress to step through pattern with improving dorsiflexion LLE during swing phase & heel strike.   Stairs             Wheelchair Mobility    Modified Rankin (Stroke Patients Only)       Balance Overall balance assessment: Needs assistance Sitting-balance support: No upper extremity supported Sitting balance-Leahy Scale: Good     Standing balance support: Bilateral upper extremity supported, Reliant on assistive device for balance Standing balance-Leahy Scale: Fair                              Cognition Arousal/Alertness: Awake/alert Behavior During Therapy: Flat affect Overall Cognitive Status: No family/caregiver present to determine baseline cognitive functioning                                 General Comments: Pt  with flat affect throughout, perseverative on taking eye drops at exact times (10 minutes apart vs 12 minutes). Pt reports she wants to go home with HHPT 2/2 her husband having dementia but when PT asked her about pt & husband electing for SNF yesterday pt states "that's what the doctor said". Notified case manager so she can f/u with pt. Pt did not recall PT doffing [facility] for both PT  sessions yesterday.        Exercises      General Comments        Pertinent Vitals/Pain Pain Assessment Pain Assessment: Faces Faces Pain Scale: Hurts a little bit Pain Location: L knee Pain Descriptors / Indicators: Discomfort Pain Intervention(s): Monitored during session    Home Living                          Prior Function            PT Goals (current goals can now be found in the care plan section) Acute Rehab PT Goals Patient Stated Goal: go home tomorrow PT Goal Formulation: With patient Time For Goal Achievement: 01/27/22 Potential to Achieve Goals: Good Progress towards PT goals: Progressing toward goals    Frequency    BID      PT Plan Current plan remains appropriate    Co-evaluation              AM-PAC PT "6 Clicks" Mobility   Outcome Measure  Help needed turning from your back to your side while in a flat bed without using bedrails?: None Help needed moving from lying on your back to sitting on the side of a flat bed without using bedrails?: A Lot Help needed moving to and from a bed to a chair (including a wheelchair)?: A Little Help needed standing up from a chair using your arms (e.g., wheelchair or bedside chair)?: A Little Help needed to walk in hospital room?: A Little Help needed climbing 3-5 steps with a railing? : A Little 6 Click Score: 18    End of Session   Activity Tolerance: Patient tolerated treatment well Patient left: in chair;with chair alarm set;with call bell/phone within reach (LLE [facility] care & knee immobilizer donned)   PT Visit Diagnosis: Muscle weakness (generalized) (M62.81);Difficulty in walking, not elsewhere classified (R26.2);Pain Pain - Right/Left: Left Pain - part of body: Knee     Time: 2778-2423 PT Time Calculation (min) (ACUTE ONLY): 26 min  Charges:  $Therapeutic Activity: 23-37 mins                     Lavone Nian, PT, DPT 01/17/22, 10:46 AM   Waunita Schooner 01/17/2022, 10:45 AM

## 2022-01-17 NOTE — Plan of Care (Signed)
  Problem: Clinical Measurements: Goal: Postoperative complications will be avoided or minimized Outcome: Progressing   Problem: Pain Management: Goal: Pain level will decrease with appropriate interventions Outcome: Progressing   Problem: Education: Goal: Knowledge of General Education information will improve Description: Including pain rating scale, medication(s)/side effects and non-pharmacologic comfort measures Outcome: Progressing   Problem: Health Behavior/Discharge Planning: Goal: Ability to manage health-related needs will improve Outcome: Progressing   Problem: Clinical Measurements: Goal: Diagnostic test results will improve Outcome: Progressing Goal: Cardiovascular complication will be avoided Outcome: Progressing   Problem: Activity: Goal: Risk for activity intolerance will decrease Outcome: Progressing   Problem: Nutrition: Goal: Adequate nutrition will be maintained Outcome: Progressing   Problem: Coping: Goal: Level of anxiety will decrease Outcome: Progressing   Problem: Elimination: Goal: Will not experience complications related to bowel motility Outcome: Progressing Goal: Will not experience complications related to urinary retention Outcome: Progressing   Problem: Pain Managment: Goal: General experience of comfort will improve Outcome: Progressing   Problem: Safety: Goal: Ability to remain free from injury will improve Outcome: Progressing

## 2022-01-17 NOTE — Progress Notes (Signed)
Physical Therapy Treatment Patient Details Name: Sheryl Miller MRN: [id number] DOB: January 19, 1936 Today's Date: 01/17/2022   History of Present Illness 86 y/o female s/p L TKA 11/7.    PT Comments    Pt seen for PT tx with pt agreeable. Pt received in bed & requires min assist for supine<>sit despite use of bed rails & HOB elevated. Pt is able to ambulate to d/c lounge & back with RW & supervision with multimodal cuing for gait pattern technique. Pt is able to negotiate stairs with supervision & L rail but demonstrates decreased recall of pattern & how to safely negotiate stairs. Notified MD & case manager that from a mobility standpoint pt is safe to d/c home with HHPT & family assistance, but may have some underlying cognitive deficits.   Recommendations for follow up therapy are one component of a multi-disciplinary discharge planning process, led by the attending physician.  Recommendations may be updated based on patient status, additional functional criteria and insurance authorization.  Follow Up Recommendations  Follow physician's recommendations for discharge plan and follow up therapies     Assistance Recommended at Discharge Intermittent Supervision/Assistance  Patient can return home with the following A little help with walking and/or transfers;A little help with bathing/dressing/bathroom;Assistance with cooking/housework;Assist for transportation;Help with stairs or ramp for entrance   Equipment Recommendations  None recommended by PT    Recommendations for Other Services       Precautions / Restrictions Precautions Precautions: Fall Required Braces or Orthoses: Knee Immobilizer - Left Knee Immobilizer - Left: Other (comment) (while in bed) Restrictions Weight Bearing Restrictions: Yes LLE Weight Bearing: Weight bearing as tolerated     Mobility  Bed Mobility Overal bed mobility: Needs Assistance Bed Mobility: Supine to Sit, Sit to Supine     Supine to sit:  Min assist, HOB elevated Sit to supine: Min assist, HOB elevated   General bed mobility comments: Pt requires assistance, extra time & cuing to elevate trunk during supine>sitting EOB with HOB elevated & bed rails. Pt requires min assist to elevate LLE onto bed during sit>supine but does fair with supporting LLE with RLE to assist LLE onto bed (after PT provides education).    Transfers Overall transfer level: Needs assistance Equipment used: Rolling walker (2 wheels) Transfers: Sit to/from Stand Sit to Stand: Min assist           General transfer comment: Extra time to power up & transition BUE from pushing on EOB to RW, slight posterior lean.    Ambulation/Gait Ambulation/Gait assistance: Supervision Gait Distance (Feet): 200 Feet (+ 200 ft) Assistive device: Rolling walker (2 wheels) Gait Pattern/deviations: Decreased step length - right, Decreased step length - left, Decreased dorsiflexion - left, Decreased dorsiflexion - right, Decreased stride length Gait velocity: significantly decreased gait speed     General Gait Details: Pt with increased weight shift to R to allow increased ease of LLE advancement. PT provides tactile cuin to decrease increased weight shif to RLE & provides cuing for increased LLE hip/knee flexion during swing phase, as well as increased LLE heel strike.   Stairs Stairs: Yes Stairs assistance: Supervision Stair Management: One rail Left, Step to pattern Number of Stairs: 4 General stair comments: Pt is able to report she should negotiate stairs laterally with rail but then attempts to use RW to negotiate stairs (demonstrating decreased recall & safety). Pt educates pt on need to use rail only & have caregiver transport RW up/down steps. Pt also requires cuing to use  same rail to descend steps as pt attempts to use contralateral rail. Pt is able to complete stair negotiation with L rail & supervision overall.   Wheelchair Mobility    Modified Rankin  (Stroke Patients Only)       Balance Overall balance assessment: Needs assistance Sitting-balance support: No upper extremity supported Sitting balance-Leahy Scale: Good     Standing balance support: Bilateral upper extremity supported, Reliant on assistive device for balance Standing balance-Leahy Scale: Fair                              Cognition Arousal/Alertness: Awake/alert Behavior During Therapy: Flat affect Overall Cognitive Status: No family/caregiver present to determine baseline cognitive functioning                                 General Comments: Pt with flat affect throughout. Pt with decreased recall as pt reports she should negotiate stairs laterally but then attempts to use RW to negotiate stairs.        Exercises      General Comments General comments (skin integrity, edema, etc.): Educated pt on need to sit in recliner again today.      Pertinent Vitals/Pain Pain Assessment Pain Assessment: Faces Faces Pain Scale: Hurts a little bit Pain Location: L knee (pt also with c/o BLE tenderness/sensitive to touch but reports this is chronic) Pain Descriptors / Indicators: Discomfort Pain Intervention(s): Monitored during session, Repositioned    Home Living                          Prior Function            PT Goals (current goals can now be found in the care plan section) Acute Rehab PT Goals Patient Stated Goal: go home tomorrow PT Goal Formulation: With patient Time For Goal Achievement: 01/27/22 Potential to Achieve Goals: Good Progress towards PT goals: Progressing toward goals    Frequency    BID      PT Plan Current plan remains appropriate    Co-evaluation              AM-PAC PT "6 Clicks" Mobility   Outcome Measure  Help needed turning from your back to your side while in a flat bed without using bedrails?: None Help needed moving from lying on your back to sitting on the side of a  flat bed without using bedrails?: A Lot Help needed moving to and from a bed to a chair (including a wheelchair)?: A Little Help needed standing up from a chair using your arms (e.g., wheelchair or bedside chair)?: A Little Help needed to walk in hospital room?: A Little Help needed climbing 3-5 steps with a railing? : A Little 6 Click Score: 18    End of Session   Activity Tolerance: Patient tolerated treatment well Patient left: in bed;with call bell/phone within reach;with bed alarm set;with family/visitor present Nurse Communication: Mobility status PT Visit Diagnosis: Muscle weakness (generalized) (M62.81);Difficulty in walking, not elsewhere classified (R26.2);Pain Pain - Right/Left: Left Pain - part of body: Knee     Time: 1610-9604 PT Time Calculation (min) (ACUTE ONLY): 33 min  Charges:  $Gait Training: 8-22 mins $Therapeutic Activity: 8-22 mins                     Lavone Nian,  PT, DPT 01/17/22, 2:44 PM  Waunita Schooner 01/17/2022, 2:40 PM

## 2022-01-17 NOTE — Progress Notes (Signed)
MEDICATION RELATED CONSULT NOTE - INITIAL   Pharmacy Consult for tolvaptan Indication: hyponatremia   No Known Allergies  Patient Measurements: Height: '5\' 2"'$  (157.5 cm) Weight: 71.2 kg (156 lb 15.5 oz) IBW/kg (Calculated) : 50.1  Vital Signs: Temp: 98.2 F (36.8 C) (11/09 2137) BP: 117/54 (11/09 2137) Pulse Rate: 78 (11/09 2137) Intake/Output from previous day: 11/09 0701 - 11/10 0700 In: 390.8 [P.O.:340; I.V.:50.8] Out: -  Intake/Output from this shift: No intake/output data recorded.  Labs: Recent Labs    01/14/22 1656 01/15/22 0345 01/15/22 0752 01/16/22 0307 01/16/22 0804 01/16/22 1534 01/16/22 2134  WBC 6.1 6.5  --  8.8  --   --   --   HGB 12.1 9.6*  --  10.1*  --   --   --   HCT 35.8* 28.2*  --  29.9*  --   --   --   PLT 233 190  --  217  --   --   --   CREATININE 0.61 0.44  --  0.51 0.52 0.46 0.68  MG  --   --  1.9  --   --   --   --   PHOS  --   --  3.4  --   --   --   --   ALBUMIN  --   --   --   --   --  3.0*  --   PROT  --   --   --   --   --  5.9*  --   AST  --   --   --   --   --  19  --   ALT  --   --   --   --   --  18  --   ALKPHOS  --   --   --   --   --  63  --   BILITOT  --   --   --   --   --  0.8  --   BILIDIR  --   --   --   --   --  0.1  --   IBILI  --   --   --   --   --  0.7  --     Estimated Creatinine Clearance: 48.3 mL/min (by C-G formula based on SCr of 0.68 mg/dL).   Microbiology: Recent Results (from the past 720 hour(s))  Surgical pcr screen     Status: None   Collection Time: 01/06/22 10:03 AM   Specimen: Nasal Mucosa; Nasal Swab  Result Value Ref Range Status   MRSA, PCR NEGATIVE NEGATIVE Final   Staphylococcus aureus NEGATIVE NEGATIVE Final    Comment: (NOTE) The Xpert SA Assay (FDA approved for NASAL specimens in patients 64 years of age and older), is one component of a comprehensive surveillance program. It is not intended to diagnose infection nor to guide or monitor treatment. Performed at [facility], [address], [place], [place] [address]     Medical History: Past Medical History:  Diagnosis Date   Allergic rhinitis, cause unspecified    Breast cyst    Diverticulosis of colon (without mention of hemorrhage)    Esophageal reflux    Female stress incontinence    Internal hemorrhoids without mention of complication    Osteoporosis, unspecified    Other and unspecified hyperlipidemia    Raynaud's syndrome    Rheumatic fever    Unspecified constipation  Assessment: 86 year old patient admitted with left knee osteoarthritis s/p left total knee arthroplasty 11/7. Past medical history includes osteoporosis (not currently on treatment per my chart review), diverticulitis, Raynaud's syndrome. During admission, sodium acutely declining from 137 >126 despite receiving 0.9% sodium chloride IVF '@75'$  mL/hr from 01/14/22 - 01/16/22. 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  CrCl > 10 mL/min  11/09 @ 1534:  Na = 122 11/09 @ 2134:  Na = 131  Goal of Therapy:  Na > 130 (per d/w nephrology NP)   Plan: tolvaptan 15 mg x 1 (order timed for 11/9 '@1500'$ ) - first dose Monitor serum Na every 8 hours x 24 hours then daily.  Contact provider (nephrology consulted) if Na exceeds 8 mEq/L per 8 hours or 12 mEq/L in 24 hours Monitor renal function daily. CTM I&O.   11/09:  Na @ 2134 = 131 ; Represents a rise of 9 mmol/L in 6 hrs. Contacted Dr Holley Raring with results, he suspects Na @ 1534 was erroneous and would like to continue monitoring in AM.   - will recheck Na on 11/10 with AM labs.   Elane Peabody D 01/17/2022,1:26 AM

## 2022-01-17 NOTE — TOC Progression Note (Signed)
Transition of Care [facility]) - Progression Note    Patient Details  Name: Sheryl Miller MRN: [id number] Date of Birth: [birth year]  Transition of Care [facility]) CM/SW [place], RN Phone Number: 01/17/2022, 9:50 AM  Clinical Narrative:    Floydene Flock completed, PASSR obtained Fl2 completed, Will review bed offers once obtained   Expected Discharge Plan: [place] Barriers to Discharge: Continued Medical Work up  Expected Discharge Plan and Services Expected Discharge Plan: [place]   Discharge Planning Services: CM Consult   Living arrangements for the past 2 months: Single Family Home                 DME Arranged: N/A DME Agency: NA                   Social Determinants of Health (SDOH) Interventions Housing Interventions: Intervention Not Indicated  Readmission Risk Interventions     No data to display

## 2022-01-17 NOTE — Progress Notes (Signed)
[facility]  ROUNDING NOTE   Subjective:   Patient sitting up in chair Continues to complain of poor appetite and nausea Unable to eat breakfast due to therapy session  Sodium 129  Objective:  Vital signs in last 24 hours:  Temp:  [97.9 F (36.6 C)-98.3 F (36.8 C)] 98.3 F (36.8 C) (11/10 0824) Pulse Rate:  [78-96] 96 (11/10 0824) Resp:  [16-17] 17 (11/10 0824) BP: (114-133)/(51-60) 114/51 (11/10 0824) SpO2:  [93 %-98 %] 94 % (11/10 0824)  Weight change:  Filed Weights   01/14/22 1003  Weight: 71.2 kg    Intake/Output: I/O last 3 completed shifts: In: 490.8 [P.O.:340; I.V.:50.8; IV Piggyback:100] Out: -    Intake/Output this shift:  No intake/output data recorded.  Physical Exam: General: NAD  Head: Normocephalic, atraumatic. Moist oral mucosal membranes  Eyes: Anicteric  Lungs:  Clear to auscultation, normal effort  Heart: Regular rate and rhythm  Abdomen:  Soft, nontender  Extremities:  trace peripheral edema.  Neurologic: Nonfocal, moving all four extremities  Skin: No lesions  Access: None    Basic Metabolic Panel: Recent Labs  Lab 01/15/22 0752 01/16/22 0307 01/16/22 0804 01/16/22 1534 01/16/22 2134 01/17/22 0612 01/17/22 0908  NA  --  128* 126* 122* 131* 129* 129*  K  --  4.5 4.8 4.6 4.7  --  4.6  CL  --  95* 94* 88* 95*  --  96*  CO2  --  '25 26 29 30  '$ --  28  GLUCOSE  --  141* 127* 134* 119*  --  114*  BUN  --  '11 11 12 10  '$ --  9  CREATININE  --  0.51 0.52 0.46 0.68  --  0.59  CALCIUM  --  8.9 8.8* 8.7* 9.5  --  8.9  MG 1.9  --   --   --   --   --   --   PHOS 3.4  --   --   --   --   --   --     Liver Function Tests: Recent Labs  Lab 01/16/22 1534  AST 19  ALT 18  ALKPHOS 63  BILITOT 0.8  PROT 5.9*  ALBUMIN 3.0*   No results for input(s): "LIPASE", "AMYLASE" in the last 168 hours. No results for input(s): "AMMONIA" in the last 168 hours.  CBC: Recent Labs  Lab 01/14/22 1656 01/15/22 0345 01/16/22 0307   WBC 6.1 6.5 8.8  HGB 12.1 9.6* 10.1*  HCT 35.8* 28.2* 29.9*  MCV 89.9 90.4 89.0  PLT 233 190 217    Cardiac Enzymes: No results for input(s): "CKTOTAL", "CKMB", "CKMBINDEX", "TROPONINI" in the last 168 hours.  BNP: Invalid input(s): "POCBNP"  CBG: No results for input(s): "GLUCAP" in the last 168 hours.  Microbiology: Results for orders placed or performed during the hospital encounter of 01/06/22  Surgical pcr screen     Status: None   Collection Time: 01/06/22 10:03 AM   Specimen: Nasal Mucosa; Nasal Swab  Result Value Ref Range Status   MRSA, PCR NEGATIVE NEGATIVE Final   Staphylococcus aureus NEGATIVE NEGATIVE Final    Comment: (NOTE) The Xpert SA Assay (FDA approved for NASAL specimens in patients 63 years of age and older), is one component of a comprehensive surveillance program. It is not intended to diagnose infection nor to guide or monitor treatment. Performed at [facility], [address], [place], [place] [address]     Coagulation Studies: No results for  input(s): "LABPROT", "INR" in the last 72 hours.  Urinalysis: No results for input(s): "COLORURINE", "LABSPEC", "PHURINE", "GLUCOSEU", "HGBUR", "BILIRUBINUR", "KETONESUR", "PROTEINUR", "UROBILINOGEN", "NITRITE", "LEUKOCYTESUR" in the last 72 hours.  Invalid input(s): "APPERANCEUR"    Imaging: No results found.   Medications:    methocarbamol (ROBAXIN) IV      acidophilus  1 capsule Oral Daily   brimonidine  1 drop Both Eyes BID   calcium carbonate  1,250 mg Oral TID WC   vitamin D3  1,000 Units Oral Daily   enoxaparin (LOVENOX) injection  30 mg Subcutaneous BID   melatonin  5 mg Oral QHS   multivitamin with minerals  1 tablet Oral Daily   simvastatin  40 mg Oral QHS   timolol  1 drop Both Eyes BID   traMADol  50 mg Oral Q6H   acetaminophen, bisacodyl, gabapentin, HYDROmorphone (DILAUDID) injection, methocarbamol **OR** methocarbamol (ROBAXIN) IV, metoCLOPramide **OR**  metoCLOPramide (REGLAN) injection, ondansetron **OR** ondansetron (ZOFRAN) IV, oxyCODONE, oxyCODONE, sodium phosphate, traZODone  Assessment/ Plan:  [name] is a 86 y.o.  female with past medical conditions including left knee osteoarthritis, osteoporosis, diverticulitis, Raynaud's syndrome, who was admitted to [facility] on 01/14/2022 for S/P total knee arthroplasty, left [Z96.652]    Hyponatremia likely due to poor oral intake, possible SIADH component.  Not responding to IV fluids.  Given Tolvaptan yesterday, sodium 129 today. IVF stopped. Patient encouraged to increase oral intake and request antiemetics if needed. .   2.  Anemia likely due to recent surgery. Hgb stable   LOS: 1 Sheryl Miller 11/10/202312:59 PM

## 2022-01-18 DIAGNOSIS — E871 Hypo-osmolality and hyponatremia: Secondary | ICD-10-CM | POA: Diagnosis not present

## 2022-01-18 DIAGNOSIS — Z96652 Presence of left artificial knee joint: Secondary | ICD-10-CM | POA: Diagnosis not present

## 2022-01-18 DIAGNOSIS — E876 Hypokalemia: Secondary | ICD-10-CM | POA: Diagnosis not present

## 2022-01-18 LAB — BASIC METABOLIC PANEL
Anion gap: 6 (ref 5–15)
Anion gap: 6 (ref 5–15)
BUN: 13 mg/dL (ref 8–23)
BUN: 13 mg/dL (ref 8–23)
CO2: 29 mmol/L (ref 22–32)
CO2: 31 mmol/L (ref 22–32)
Calcium: 8.4 mg/dL — ABNORMAL LOW (ref 8.9–10.3)
Calcium: 8.8 mg/dL — ABNORMAL LOW (ref 8.9–10.3)
Chloride: 93 mmol/L — ABNORMAL LOW (ref 98–111)
Chloride: 94 mmol/L — ABNORMAL LOW (ref 98–111)
Creatinine, Ser: 0.48 mg/dL (ref 0.44–1.00)
Creatinine, Ser: 0.63 mg/dL (ref 0.44–1.00)
GFR, Estimated: >60 mL/min (ref >60)
GFR, Estimated: >60 mL/min (ref >60)
Glucose, Bld: 114 mg/dL — ABNORMAL HIGH (ref 70–99)
Glucose, Bld: 165 mg/dL — ABNORMAL HIGH (ref 70–99)
Potassium: 4.4 mmol/L (ref 3.5–5.1)
Potassium: 4.6 mmol/L (ref 3.5–5.1)
Sodium: 129 mmol/L — ABNORMAL LOW (ref 135–145)
Sodium: 130 mmol/L — ABNORMAL LOW (ref 135–145)

## 2022-01-18 LAB — LIPID PANEL
Cholesterol: 130 mg/dL (ref 0–200)
HDL: 64 mg/dL (ref >40)
LDL Cholesterol: 55 mg/dL (ref 0–99)
Total CHOL/HDL Ratio: 2 RATIO
Triglycerides: 54 mg/dL (ref <150)
VLDL: 11 mg/dL (ref 0–40)

## 2022-01-18 LAB — TSH: TSH: 1.015 u[IU]/mL (ref 0.350–4.500)

## 2022-01-18 LAB — SODIUM, URINE, RANDOM: Sodium, Ur: <10 mmol/L

## 2022-01-18 LAB — OSMOLALITY: Osmolality: 275 mOsm/kg (ref 275–295)

## 2022-01-18 LAB — OSMOLALITY, URINE: Osmolality, Ur: 522 mOsm/kg (ref 300–900)

## 2022-01-18 MED ORDER — SORBITOL 70 % SOLN: 960.0000 mL | TOPICAL_OIL | Freq: Once | ORAL | Status: DC | PRN

## 2022-01-18 MED ORDER — ENSURE ENLIVE PO LIQD
237.0000 mL | Freq: Two times a day (BID) | ORAL | Status: DC
  Administered 2022-01-18 – 2022-01-20 (×5): 237 mL via ORAL

## 2022-01-18 MED ORDER — SENNA 8.6 MG PO TABS
1.0000 | ORAL_TABLET | Freq: Every day | ORAL | Status: DC
  Administered 2022-01-18 – 2022-01-20 (×3): 8.6 mg via ORAL
  Filled 2022-01-18 (×3): qty 1

## 2022-01-18 MED ORDER — POLYETHYLENE GLYCOL 3350 17 G PO PACK
17.0000 g | PACK | Freq: Two times a day (BID) | ORAL | Status: DC
  Administered 2022-01-18 – 2022-01-20 (×3): 17 g via ORAL
  Filled 2022-01-18 (×3): qty 1

## 2022-01-18 NOTE — Progress Notes (Signed)
Occupational Therapy Treatment Patient Details Name: Sheryl Miller MRN: [id number] DOB: 1936/02/18 Today's Date: 01/18/2022   History of present illness 86 y/o female s/p L TKA 11/7.   OT comments  Pt seen for OT tx. Pt endorsing 5/10 pain in L knee at rest, reports just recently back to bed after PT and mobility. Additional education/training and problem solving for ADL and mobility at home, including falls prevention, home/routines modifications, AE/DME, polar care mgt, and compression stocking mgt. Pt verbalized understanding, appreciative of additional instruction to support recall and carryover.    Recommendations for follow up therapy are one component of a multi-disciplinary discharge planning process, led by the attending physician.  Recommendations may be updated based on patient status, additional functional criteria and insurance authorization.    Follow Up Recommendations  Follow physician's recommendations for discharge plan and follow up therapies    Assistance Recommended at Discharge Intermittent Supervision/Assistance  Patient can return home with the following  A little help with walking and/or transfers;A little help with bathing/dressing/bathroom;Help with stairs or ramp for entrance;Assist for transportation;Assistance with cooking/housework   Equipment Recommendations  None recommended by OT    Recommendations for Other Services      Precautions / Restrictions Precautions Precautions: Fall Required Braces or Orthoses: Knee Immobilizer - Left Knee Immobilizer - Left: Other (comment) (while in bed) Restrictions Weight Bearing Restrictions: Yes LLE Weight Bearing: Weight bearing as tolerated       Mobility Bed Mobility               General bed mobility comments: Pt declined, reporting recently returned to bed after ambulating and fatigued    Transfers                   General transfer comment: Pt declined, reporting recently returned  to bed after ambulating and fatigued     Balance                                           ADL either performed or assessed with clinical judgement   ADL Overall ADL's : Needs assistance/impaired                     Lower Body Dressing: Minimal assistance                      Extremity/Trunk Assessment              Vision       Perception     Praxis      Cognition Arousal/Alertness: Awake/alert Behavior During Therapy: WFL for tasks assessed/performed Overall Cognitive Status: No family/caregiver present to determine baseline cognitive functioning                                          Exercises Other Exercises Other Exercises: Additional education/training and problem solving for ADL and mobility at home, including falls prevention, home/routines modifications, AE/DME, polar care mgt, and compression stocking mgt.    Shoulder Instructions       General Comments      Pertinent Vitals/ Pain       Pain Assessment Pain Assessment: 0-10 Pain Score: 5  Pain Location: L knee Pain Descriptors / Indicators: Discomfort, Aching Pain Intervention(s): Limited  activity within patient's tolerance, Monitored during session, Premedicated before session, Repositioned, Ice applied  Home Living                                          Prior Functioning/Environment              Frequency  Min 2X/week        Progress Toward Goals  OT Goals(current goals can now be found in the care plan section)  Progress towards OT goals: Progressing toward goals  Acute Rehab OT Goals Patient Stated Goal: to return home OT Goal Formulation: With patient Time For Goal Achievement: 01/29/22 Potential to Achieve Goals: Good  Plan Discharge plan remains appropriate;Frequency remains appropriate    Co-evaluation                 AM-PAC OT "6 Clicks" Daily Activity     Outcome Measure   Help from  another person eating meals?: None Help from another person taking care of personal grooming?: A Little Help from another person toileting, which includes using toliet, bedpan, or urinal?: A Little Help from another person bathing (including washing, rinsing, drying)?: A Little Help from another person to put on and taking off regular upper body clothing?: None Help from another person to put on and taking off regular lower body clothing?: A Little 6 Click Score: 20    End of Session    OT Visit Diagnosis: Muscle weakness (generalized) (M62.81);Dizziness and giddiness (R42)   Activity Tolerance Patient tolerated treatment well   Patient Left in bed;with call bell/phone within reach;with bed alarm set;Other (comment) (KI donned, polar care in place)   Nurse Communication          Time: [phone] OT Time Calculation (min): 17 min  Charges: OT General Charges $OT Visit: 1 Visit OT Treatments $Self Care/Home Management : 8-22 mins  Ardeth Perfect., MPH, MS, OTR/L ascom [phone] 01/18/22, 10:36 AM

## 2022-01-18 NOTE — Progress Notes (Signed)
     Subjective: 4 Days Post-Op Procedure(s) (LRB): TOTAL KNEE ARTHROPLASTY (Left)   Patient is sitting in the bedside chair and comfortable this morning.  She states that pain is relatively well controlled.  She denies any dizziness or nausea.     Objective:   VITALS:   Vitals:   01/17/22 1446 01/17/22 2111  BP: (!) 134/59 139/63  Pulse: 88 100  Resp: 17 20  Temp: 97.8 F (36.6 C) 99.7 F (37.6 C)  SpO2: 96% 93%    Neurovascular intact Dorsiflexion/Plantar flexion intact Incision: scant drainage No cellulitis present Compartment soft  LABS Recent Labs    01/16/22 0307  HGB 10.1*  HCT 29.9*  WBC 8.8  PLT 217     Recent Labs    01/17/22 0908 01/17/22 1551 01/18/22 0012  NA 129* 133* 129*  K 4.6 4.3 4.6  BUN '9 10 13  '$ CREATININE 0.59 0.60 0.48  GLUCOSE 114* 155* 114*      Assessment/Plan: 4 Days Post-Op Procedure(s) (LRB): TOTAL KNEE ARTHROPLASTY (Left)   Patient appears to be progressing from a PT standpoint and current recommendations are for discharge home.  Patient is being followed by the hospitalist and nephrology service and will require clearance with respect to her hyponatremia.  Sodium increased to 133 yesterday, however last level overnight was 129.  She states she has increased her p.o. intake.  She appears relatively asymptomatic. Will await medical clearance prior to discharge. Patient will remain on Lovenox until follow-up in the office in approximately 2 weeks.    Renee Harder

## 2022-01-18 NOTE — Consult Note (Signed)
Initial Consultation Note   Patient: Sheryl Miller FYB:[id number] DOB: February 22, 1936 PCP: Teodora Medici, DO DOA: 01/14/2022 DOS: the patient was seen and examined on 01/18/2022 Primary service: Thornton Park, MD  Referring physician: Dr. Mack Guise Reason for consult: electrolyte abnormalities  Assessment/Plan:  S/P total knee arthroplasty, left 11/7: Has tolerated pain in the surgical site. -Pain management per primary Ortho team -analgesia PRN -Patient is on subcutaneous Lovenox for DVT prophylaxis   Hypocalcemia(improving s/p replacement): Ca++ 6.4>8.9. Vitamin D and PTH levels WNL. Albumin 3.0 -Continue home vitamin D supplement -continue Os-Cal supplement - continue to monitor   Hypokalemia (resolved s/p replacement): K+ 3.3>4.5. Mg++ and Phos levels WNL -monitor and replete PRN  Hyponatremia- resistant to isotonic IV fluids and one dose tolvaptan per nephrology. Patient denies dizziness, nausea, confusion or any other symptoms. GFR >60. Albumin 3.0 yesterday. SIADH workup - consulted nephrology, appreciate your care - continue isotonic fluids, increased rate - urine sodium and osmol evaluation. Morning cortisol  - continue to monitor   HLD (hyperlipidemia) -Zocor   Normocytic anemia: Hemoglobin 12.1 on 01/14/2022. Stable and improved 9.6 (post-op) > 10.1 -Follow-up with CBC am   History of OP (osteoporosis) -Vitamin D and Os-cal supplement   TRH will continue to follow the patient.  HPI: Sheryl Miller is a 86 y.o. female with past medical history of hyperlipidemia, GERD, osteoporosis, left knee pain, who is admitted by Dr. Mack Guise of Ortho for left knee replacement 11/7.Patient was found to have hypocalcemia 6.4 post op.  No muscle cramping.  We are asked to consult. Consult continues for management of hyponatremia.  Patient feels overall well.  Review of Systems: As mentioned in the history of present illness. All other systems reviewed and are  negative. Past Medical History:  Diagnosis Date   Allergic rhinitis, cause unspecified    Breast cyst    Diverticulosis of colon (without mention of hemorrhage)    Esophageal reflux    Female stress incontinence    Internal hemorrhoids without mention of complication    Osteoporosis, unspecified    Other and unspecified hyperlipidemia    Raynaud's syndrome    Rheumatic fever    Unspecified constipation    Past Surgical History:  Procedure Laterality Date   ABDOMINAL HYSTERECTOMY     BLADDER REPAIR     BREAST BIOPSY Right 2000   fibrocystic disease   KNEE ARTHROSCOPY Right ~5/16   ROTATOR CUFF REPAIR  2006   SHOULDER ARTHROSCOPY WITH ROTATOR CUFF REPAIR AND SUBACROMIAL DECOMPRESSION  2003   R, Scott Dean   TONSILLECTOMY     TONSILLECTOMY     TOTAL KNEE ARTHROPLASTY Left 01/14/2022   Procedure: TOTAL KNEE ARTHROPLASTY;  Surgeon: Thornton Park, MD;  Location: [facility] ORS;  Service: Orthopedics;  Laterality: Left;   TUBAL LIGATION     Social History:  reports that she has never smoked. She has never used smokeless tobacco. She reports current alcohol use. She reports that she does not use drugs.  No Known Allergies  Family History  Problem Relation Age of Onset   Coronary artery disease Mother    Heart disease Mother    Diabetes Mother    COPD Father    Breast cancer Sister        2 (age 56?, age 27?); "Gene pos"   Diabetes Brother    Breast cancer Maternal Aunt        2   Stroke Paternal Grandmother    Diabetes Brother    Breast cancer Sister  Breast cancer Daughter        ? at age 72   Breast cancer Maternal Aunt    Breast cancer Niece    Breast cancer Niece     Prior to Admission medications   Medication Sig Start Date End Date Taking? Authorizing Provider  Ascorbic Acid (VITAMIN C) 1000 MG tablet Take 1,000 mg by mouth daily.   Yes [provider]  brimonidine (ALPHAGAN) 0.2 % ophthalmic solution Place 1 drop into both eyes 2 (two) times daily.  08/03/19  Yes [provider]  diclofenac Sodium (VOLTAREN) 1 % GEL Apply 2 g topically daily as needed.   Yes [provider]  fexofenadine (ALLEGRA) 180 MG tablet Take 180 mg by mouth daily.   Yes [provider]  gabapentin (NEURONTIN) 100 MG capsule Take 100 mg by mouth daily as needed.   Yes [provider]  ketoconazole (NIZORAL) 2 % shampoo MASSAGE INTO SCALP EVERY OTHER DAY, LET SIT SEVERAL MINUTES BEFORE RINSING. 09/30/21  Yes Brendolyn Patty, MD  melatonin 5 MG TABS Take 5 mg by mouth at bedtime.   Yes [provider]  mometasone (ELOCON) 0.1 % lotion APPLY TO AFFECTED AREAS OF SCALP TWICE DAILY UNTIL IMPROVED AND AS NEEDED FOR RECURRENCE. 01/07/22  Yes Ralene Bathe, MD  Multiple Vitamins-Minerals (CENTRUM SILVER ULTRA WOMENS) TABS Take 1 tablet by mouth daily.    Yes [provider]  Probiotic Product (PROBIOTIC-10 PO) Take 1 capsule by mouth daily.   Yes [provider]  simvastatin (ZOCOR) 40 MG tablet TAKE 1 TABLET AT BEDTIME 04/22/21  Yes Viviana Simpler I, MD  timolol (TIMOPTIC) 0.5 % ophthalmic solution Place 1 drop into both eyes 2 (two) times daily. 07/26/19  Yes [provider]  traZODone (DESYREL) 150 MG tablet TAKE 1 TABLET AT BEDTIME AS NEEDED  FOR  SLEEP 03/18/21  Yes Viviana Simpler I, MD  TURMERIC PO Take 500 mg by mouth daily.   Yes [provider]  Ubiquinone (ULTRA COQ10 PO) Take 1 capsule by mouth daily.   Yes [provider]  vitamin D3 (CHOLECALCIFEROL) 25 MCG tablet Take 1,000 Units by mouth daily.   Yes [provider]    Physical Exam: Vitals:   01/17/22 0508 01/17/22 0824 01/17/22 1446 01/17/22 2111  BP: 133/60 (!) 114/51 (!) 134/59 139/63  Pulse: 87 96 88 100  Resp: '16 17 17 20  '$ Temp: 97.9 F (36.6 C) 98.3 F (36.8 C) 97.8 F (36.6 C) 99.7 F (37.6 C)  TempSrc:      SpO2: 98% 94% 96% 93%  Weight:      Height:       General: NAD, pleasant, able to  participate in exam Cardiac: RRR, normal heart sounds, no murmurs. 2+ radial and PT pulses bilaterally Respiratory: CTAB, normal effort, No wheezes, rales or rhonchi Extremities: left knee in brace. Mild edema locally. No generalized edema Skin: warm and dry, no rashes noted Neuro: alert and oriented, no focal deficits Psych: Normal affect and mood  Data Reviewed:     Latest Ref Rng & Units 01/18/2022   12:12 AM 01/17/2022    3:51 PM 01/17/2022    9:08 AM  BMP  Glucose 70 - 99 mg/dL 114  155  114   BUN 8 - 23 mg/dL '13  10  9   '$ Creatinine 0.44 - 1.00 mg/dL 0.48  0.60  0.59   Sodium 135 - 145 mmol/L 129  133  129   Potassium 3.5 -  5.1 mmol/L 4.6  4.3  4.6   Chloride 98 - 111 mmol/L 94  95  96   CO2 22 - 32 mmol/L '29  30  28   '$ Calcium 8.9 - 10.3 mg/dL 8.4  9.1  8.9      Family Communication: none Primary team communication: discussed with primary Thank you very much for involving [place] in the care of your patient.  Author: Richarda Osmond, MD 01/18/2022 7:41 AM  For on call review [email or web address].

## 2022-01-18 NOTE — Progress Notes (Addendum)
[facility]  ROUNDING NOTE   Subjective:   Patient resting in bed, working with therapy Appetite remains poor due to nausea, able to tolerate oatmeal for breakfast.  Sodium 133 yesterday afternoon, now 129  Objective:  Vital signs in last 24 hours:  Temp:  [97.8 F (36.6 C)-99.7 F (37.6 C)] 99.7 F (37.6 C) (11/10 2111) Pulse Rate:  [88-100] 100 (11/10 2111) Resp:  [17-20] 20 (11/10 2111) BP: (134-139)/(59-63) 139/63 (11/10 2111) SpO2:  [93 %-96 %] 93 % (11/10 2111)  Weight change:  Filed Weights   01/14/22 1003  Weight: 71.2 kg    Intake/Output: I/O last 3 completed shifts: In: 70.4 [I.V.:70.4] Out: -    Intake/Output this shift:  No intake/output data recorded.  Physical Exam: General: NAD  Head: Normocephalic, atraumatic. Moist oral mucosal membranes  Eyes: Anicteric  Lungs:  Clear to auscultation, normal effort  Heart: Regular rate and rhythm  Abdomen:  Soft, nontender  Extremities:  trace peripheral edema.  Neurologic: Nonfocal, moving all four extremities  Skin: No lesions  Access: None    Basic Metabolic Panel: Recent Labs  Lab 01/15/22 0752 01/16/22 0307 01/16/22 1534 01/16/22 2134 01/17/22 0612 01/17/22 0908 01/17/22 1551 01/18/22 0012  NA  --    < > 122* 131* 129* 129* 133* 129*  K  --    < > 4.6 4.7  --  4.6 4.3 4.6  CL  --    < > 88* 95*  --  96* 95* 94*  CO2  --    < > 29 30  --  '28 30 29  '$ GLUCOSE  --    < > 134* 119*  --  114* 155* 114*  BUN  --    < > 12 10  --  '9 10 13  '$ CREATININE  --    < > 0.46 0.68  --  0.59 0.60 0.48  CALCIUM  --    < > 8.7* 9.5  --  8.9 9.1 8.4*  MG 1.9  --   --   --   --   --   --   --   PHOS 3.4  --   --   --   --   --   --   --    < > = values in this interval not displayed.     Liver Function Tests: Recent Labs  Lab 01/16/22 1534  AST 19  ALT 18  ALKPHOS 63  BILITOT 0.8  PROT 5.9*  ALBUMIN 3.0*    No results for input(s): "LIPASE", "AMYLASE" in the last 168 hours. No results  for input(s): "AMMONIA" in the last 168 hours.  CBC: Recent Labs  Lab 01/14/22 1656 01/15/22 0345 01/16/22 0307  WBC 6.1 6.5 8.8  HGB 12.1 9.6* 10.1*  HCT 35.8* 28.2* 29.9*  MCV 89.9 90.4 89.0  PLT 233 190 217     Cardiac Enzymes: No results for input(s): "CKTOTAL", "CKMB", "CKMBINDEX", "TROPONINI" in the last 168 hours.  BNP: Invalid input(s): "POCBNP"  CBG: No results for input(s): "GLUCAP" in the last 168 hours.  Microbiology: Results for orders placed or performed during the hospital encounter of 01/06/22  Surgical pcr screen     Status: None   Collection Time: 01/06/22 10:03 AM   Specimen: Nasal Mucosa; Nasal Swab  Result Value Ref Range Status   MRSA, PCR NEGATIVE NEGATIVE Final   Staphylococcus aureus NEGATIVE NEGATIVE Final    Comment: (NOTE) The Xpert SA Assay (FDA approved  for NASAL specimens in patients 33 years of age and older), is one component of a comprehensive surveillance program. It is not intended to diagnose infection nor to guide or monitor treatment. Performed at [facility], [address] [place], [place] [address]     Coagulation Studies: No results for input(s): "LABPROT", "INR" in the last 72 hours.  Urinalysis: No results for input(s): "COLORURINE", "LABSPEC", "PHURINE", "GLUCOSEU", "HGBUR", "BILIRUBINUR", "KETONESUR", "PROTEINUR", "UROBILINOGEN", "NITRITE", "LEUKOCYTESUR" in the last 72 hours.  Invalid input(s): "APPERANCEUR"    Imaging: No results found.   Medications:    methocarbamol (ROBAXIN) IV      acidophilus  1 capsule Oral Daily   brimonidine  1 drop Both Eyes BID   calcium carbonate  1,250 mg Oral TID WC   vitamin D3  1,000 Units Oral Daily   enoxaparin (LOVENOX) injection  30 mg Subcutaneous BID   feeding supplement  237 mL Oral BID BM   melatonin  5 mg Oral QHS   multivitamin with minerals  1 tablet Oral Daily   simvastatin  40 mg Oral QHS   timolol  1 drop Both Eyes BID   traMADol  50 mg Oral  Q6H   acetaminophen, bisacodyl, gabapentin, HYDROmorphone (DILAUDID) injection, methocarbamol **OR** methocarbamol (ROBAXIN) IV, metoCLOPramide **OR** metoCLOPramide (REGLAN) injection, ondansetron **OR** ondansetron (ZOFRAN) IV, oxyCODONE, oxyCODONE, sodium phosphate, traZODone  Assessment/ Plan:  Sheryl Miller is a 86 y.o.  female with past medical conditions including left knee osteoarthritis, osteoporosis, diverticulitis, Raynaud's syndrome, who was admitted to [facility] on 01/14/2022 for S/P total knee arthroplasty, left [Z96.652]   Hyponatremia likely due to poor oral intake, possible SIADH component.  Not responding to IV fluids.  Given Tolvaptan this admission. Sodium increased to 133 yesterday afternoon but 129 this morning. Ordered sodium this morning, 130. will approve discharge from renal stance with follow up with PCP.  Patient encouraged to monitor fluid intake for the next few days.  Will order Ensure twice daily.  2.  Anemia likely due to recent surgery. Hgb stable   LOS: 2 Sheryl Miller 11/11/202311:22 AM

## 2022-01-18 NOTE — Plan of Care (Signed)
  Problem: Skin Integrity: Goal: Will show signs of wound healing Outcome: Progressing   Problem: Education: Goal: Knowledge of General Education information will improve Description: Including pain rating scale, medication(s)/side effects and non-pharmacologic comfort measures Outcome: Progressing   Problem: Health Behavior/Discharge Planning: Goal: Ability to manage health-related needs will improve Outcome: Progressing   Problem: Clinical Measurements: Goal: Ability to maintain clinical measurements within normal limits will improve Outcome: Progressing Goal: Will remain free from infection Outcome: Progressing Goal: Diagnostic test results will improve Outcome: Progressing Goal: Respiratory complications will improve Outcome: Progressing Goal: Cardiovascular complication will be avoided Outcome: Progressing   Problem: Activity: Goal: Risk for activity intolerance will decrease Outcome: Progressing   Problem: Nutrition: Goal: Adequate nutrition will be maintained Outcome: Progressing   Problem: Coping: Goal: Level of anxiety will decrease Outcome: Progressing   Problem: Elimination: Goal: Will not experience complications related to bowel motility Outcome: Progressing Goal: Will not experience complications related to urinary retention Outcome: Progressing   Problem: Pain Managment: Goal: General experience of comfort will improve Outcome: Progressing   Problem: Safety: Goal: Ability to remain free from injury will improve Outcome: Progressing

## 2022-01-18 NOTE — Progress Notes (Signed)
Physical Therapy Treatment Patient Details Name: Sheryl Miller MRN: [id number] DOB: 01/21/1936 Today's Date: 01/18/2022   History of Present Illness 86 y/o female s/p L TKA 11/7.    PT Comments    Pt showed great effort with ambulation and was able to do a prolonged bout of slow but safe and consistent gait with no LOBs, minimal fatigue and consistently improving cadence/confidence with cuing and distance.  She is not having any nausea, but some abdominal tightness as she has been having some issues with constipation, did get to commode but despite effort unable to BM.   Reviewed exercises that she needs to be doing on her own and she does confirm that she will be better about them.    Recommendations for follow up therapy are one component of a multi-disciplinary discharge planning process, led by the attending physician.  Recommendations may be updated based on patient status, additional functional criteria and insurance authorization.  Follow Up Recommendations  Follow physician's recommendations for discharge plan and follow up therapies     Assistance Recommended at Discharge Intermittent Supervision/Assistance  Patient can return home with the following A little help with walking and/or transfers;A little help with bathing/dressing/bathroom;Assistance with cooking/housework;Assist for transportation;Help with stairs or ramp for entrance   Equipment Recommendations  None recommended by PT    Recommendations for Other Services       Precautions / Restrictions Precautions Precautions: Fall Knee Immobilizer - Left: Other (comment) (in bed) Restrictions LLE Weight Bearing: Weight bearing as tolerated     Mobility  Bed Mobility Overal bed mobility: Modified Independent Bed Mobility: Supine to Sit     Supine to sit: Supervision     General bed mobility comments: pt able to transition to sitting EOB slowly but without assist    Transfers Overall transfer level:  Needs assistance Equipment used: Rolling walker (2 wheels) Transfers: Sit to/from Stand Sit to Stand: Supervision           General transfer comment: Pt needed plenty of time to set up and reinforcement for sequencing but with encouagement was able to rise w/o phyiscal assist and good safety.  On subsequent attempts needing less time and encouargement and showing increased confidence.  Did raise bed ~2" on first attempt for pt comfort    Ambulation/Gait Ambulation/Gait assistance: Supervision Gait Distance (Feet): 400 Feet Assistive device: Rolling walker (2 wheels)         General Gait Details: Pt again with much improved gait from prior days.  She was able to maintain a consistent cadence and did not have hesitaiton with WBing.  Pt reports some minimal fatigue with this more prolonged effort, but vitals stable and pt feeling well.   Stairs Stairs: Yes Stairs assistance: Modified independent (Device/Increase time) Stair Management: One rail Left, Step to pattern Number of Stairs: 4 General stair comments: Pt apparently with some confusion yesterday about stair management; today, without cuing, she recalled appropriate positioning, rail use, sequencing and strategy.  Safely negotiated w/o direct and very minimal verbal assist.   Wheelchair Mobility    Modified Rankin (Stroke Patients Only)       Balance Overall balance assessment: Needs assistance Sitting-balance support: No upper extremity supported Sitting balance-Leahy Scale: Good     Standing balance support: Bilateral upper extremity supported, Reliant on assistive device for balance Standing balance-Leahy Scale: Good Standing balance comment: No LOBs, multiple event free transitions/transfers today all w/o issue  Cognition Arousal/Alertness: Awake/alert Behavior During Therapy: WFL for tasks assessed/performed Overall Cognitive Status: Within Functional Limits for tasks  assessed                                          Exercises Total Joint Exercises Ankle Circles/Pumps: AROM, 10 reps Quad Sets: Strengthening, 10 reps Heel Slides: AROM, 10 reps Hip ABduction/ADduction: AROM, Strengthening, 10 reps Straight Leg Raises: AAROM, 10 reps Long Arc Quad: AROM, AAROM, 5 reps Knee Flexion: AAROM, 5 reps Goniometric ROM: 0-92 Other Exercises Other Exercises: Pt admits she hasn't been doing her HEP a lot, reviewd and reiterated some of the basic exercises that she can be doing with and w/o the KI donned.  Pt performs a few and voices understanding - states "I will do them"    General Comments        Pertinent Vitals/Pain Pain Assessment Pain Assessment: 0-10 Pain Score: 5  Faces Pain Scale: Hurts a little bit Pain Location: L knee    Home Living                          Prior Function            PT Goals (current goals can now be found in the care plan section) Progress towards PT goals: Progressing toward goals    Frequency    BID      PT Plan Current plan remains appropriate    Co-evaluation              AM-PAC PT "6 Clicks" Mobility   Outcome Measure  Help needed turning from your back to your side while in a flat bed without using bedrails?: None Help needed moving from lying on your back to sitting on the side of a flat bed without using bedrails?: A Little Help needed moving to and from a bed to a chair (including a wheelchair)?: None Help needed standing up from a chair using your arms (e.g., wheelchair or bedside chair)?: A Little Help needed to walk in hospital room?: None Help needed climbing 3-5 steps with a railing? : A Little 6 Click Score: 21    End of Session Equipment Utilized During Treatment: Gait belt Activity Tolerance: Patient tolerated treatment well Patient left: in bed;with call bell/phone within reach;with bed alarm set;with family/visitor present Nurse Communication:  Mobility status PT Visit Diagnosis: Muscle weakness (generalized) (M62.81);Difficulty in walking, not elsewhere classified (R26.2);Pain Pain - Right/Left: Left Pain - part of body: Knee     Time: 5465-6812 PT Time Calculation (min) (ACUTE ONLY): 27 min  Charges:  $Gait Training: 8-22 mins $Therapeutic Exercise: 8-22 mins                     Kreg Shropshire, DPT 01/18/2022, 5:44 PM

## 2022-01-18 NOTE — TOC Progression Note (Addendum)
Transition of Care [facility]) - Progression Note    Patient Details  Name: KATELEEN ENCARNACION MRN: [id number] Date of Birth: 07-Nov-1935  Transition of Care [facility]) CM/SW Contact  Izola Price, RN Phone Number: 01/18/2022, 12:35 PM  Clinical Narrative: 11/11: Spoke to patient to discuss bed offers and patient states she wishes to go home with [facility]. Pt notes on 11/10  and PT contact again this am, do indicate patient would be safe to DC with family assistance and home health. Per patient, spouse is able to assist. Budd Palmer for [facility] services. Patient states she had RW and BSC at home. Simmie Davies RN CM   [facility] accepted pending [place] orders via [employer] Simmie Davies RN CM   Expected Discharge Plan: [place] Barriers to Discharge: Continued Medical Work up  Expected Discharge Plan and Services Expected Discharge Plan: [place]   Discharge Planning Services: CM Consult   Living arrangements for the past 2 months: Single Family Home                 DME Arranged: N/A DME Agency: NA                   Social Determinants of Health (SDOH) Interventions Housing Interventions: Intervention Not Indicated  Readmission Risk Interventions     No data to display

## 2022-01-18 NOTE — Progress Notes (Signed)
Physical Therapy Treatment Patient Details Name: Sheryl Miller MRN: [id number] DOB: 05/14/1935 Today's Date: 01/18/2022   History of Present Illness 86 y/o female s/p L TKA 11/7.    PT Comments    Pt familiar to this PT form POD1&2 where she struggled with nausea, pain and generally slow progress.  Today pt did very well; she showed ability to rise to standing w/o assist from standard height recliner, she was able to ambulate >300 ft with consistent cadence, much improved speed and confidence and did not require excessive UE use of the walker.  She reports feeling generally much better and states she hopes to go home soon.  Pt making nice improvement and showed great effort.     Recommendations for follow up therapy are one component of a multi-disciplinary discharge planning process, led by the attending physician.  Recommendations may be updated based on patient status, additional functional criteria and insurance authorization.  Follow Up Recommendations  Follow physician's recommendations for discharge plan and follow up therapies     Assistance Recommended at Discharge Intermittent Supervision/Assistance  Patient can return home with the following A little help with walking and/or transfers;A little help with bathing/dressing/bathroom;Assistance with cooking/housework;Assist for transportation;Help with stairs or ramp for entrance   Equipment Recommendations  None recommended by PT    Recommendations for Other Services       Precautions / Restrictions Precautions Precautions: Fall Required Braces or Orthoses: Knee Immobilizer - Left Knee Immobilizer - Left: Other (comment) (in bed) Restrictions Weight Bearing Restrictions: Yes LLE Weight Bearing: Weight bearing as tolerated     Mobility  Bed Mobility               General bed mobility comments: in reccliner pre/post session    Transfers Overall transfer level: Needs assistance Equipment used: Rolling  walker (2 wheels) Transfers: Sit to/from Stand Sit to Stand: Min guard           General transfer comment: Pt needed plenty of time to set up and reinforcement for sequencing but with encouagement was able to rise w/o phyiscal assist and good safety.  On subsequent attempts needing less time and encouargement and showing increased confidence.    Ambulation/Gait Ambulation/Gait assistance: Supervision Gait Distance (Feet): 300 Feet Assistive device: Rolling walker (2 wheels)         General Gait Details: Pt with much improved tolerance, cadence, speed and generally showed appropriate confidence, speed and UE reliance with ambulation this date.   Stairs Stairs: Yes Stairs assistance: Modified independent (Device/Increase time) Stair Management: One rail Left, Step to pattern Number of Stairs: 4 General stair comments: Pt apparently with some confusion yesterday about stair management; today, without cuing, she recalled appropriate positioning, rail use, sequencing and strategy.  Safely negotiated w/o direct and very minimal verbal assist.   Wheelchair Mobility    Modified Rankin (Stroke Patients Only)       Balance Overall balance assessment: Needs assistance Sitting-balance support: No upper extremity supported Sitting balance-Leahy Scale: Good     Standing balance support: Bilateral upper extremity supported, Reliant on assistive device for balance Standing balance-Leahy Scale: Good Standing balance comment: Pt with much improved confidence and steadiness with standing/balance than when she worked with this PT earlier this week.  Still requires RW for support                            Cognition Arousal/Alertness: Awake/alert Behavior During Therapy: [facility] for  tasks assessed/performed Overall Cognitive Status: No family/caregiver present to determine baseline cognitive functioning                                          Exercises  Total Joint Exercises Ankle Circles/Pumps: AROM, 10 reps Quad Sets: Strengthening, 10 reps Heel Slides: AROM, 10 reps Hip ABduction/ADduction: AROM, Strengthening, 10 reps Straight Leg Raises: AAROM, 10 reps Long Arc Quad: AROM, AAROM, 5 reps Knee Flexion: AAROM, 5 reps Goniometric ROM: 0-92    General Comments        Pertinent Vitals/Pain Pain Assessment Pain Assessment: 0-10 Pain Score: 6  Faces Pain Scale: Hurts a little bit Pain Location: L knee    Home Living                          Prior Function            PT Goals (current goals can now be found in the care plan section) Progress towards PT goals: Progressing toward goals    Frequency    BID      PT Plan Current plan remains appropriate    Co-evaluation              AM-PAC PT "6 Clicks" Mobility   Outcome Measure  Help needed turning from your back to your side while in a flat bed without using bedrails?: None Help needed moving from lying on your back to sitting on the side of a flat bed without using bedrails?: A Little Help needed moving to and from a bed to a chair (including a wheelchair)?: None Help needed standing up from a chair using your arms (e.g., wheelchair or bedside chair)?: A Little Help needed to walk in hospital room?: A Little Help needed climbing 3-5 steps with a railing? : A Little 6 Click Score: 20    End of Session Equipment Utilized During Treatment: Gait belt Activity Tolerance: Patient tolerated treatment well Patient left: in bed;with call bell/phone within reach;with bed alarm set;with family/visitor present Nurse Communication: Mobility status PT Visit Diagnosis: Muscle weakness (generalized) (M62.81);Difficulty in walking, not elsewhere classified (R26.2);Pain Pain - Right/Left: Left Pain - part of body: Knee     Time: 0829-0912 PT Time Calculation (min) (ACUTE ONLY): 43 min  Charges:  $Gait Training: 8-22 mins $Therapeutic Exercise: 8-22  mins $Therapeutic Activity: 8-22 mins                     Kreg Shropshire, DPT 01/18/2022, 1:09 PM

## 2022-01-19 ENCOUNTER — Inpatient Hospital Stay: Payer: Medicare HMO

## 2022-01-19 DIAGNOSIS — E876 Hypokalemia: Secondary | ICD-10-CM | POA: Diagnosis not present

## 2022-01-19 DIAGNOSIS — Z96652 Presence of left artificial knee joint: Secondary | ICD-10-CM | POA: Diagnosis not present

## 2022-01-19 DIAGNOSIS — E871 Hypo-osmolality and hyponatremia: Secondary | ICD-10-CM | POA: Diagnosis not present

## 2022-01-19 LAB — BASIC METABOLIC PANEL
Anion gap: 4 — ABNORMAL LOW (ref 5–15)
Anion gap: 9 (ref 5–15)
BUN: 15 mg/dL (ref 8–23)
BUN: 12 mg/dL (ref 8–23)
CO2: 29 mmol/L (ref 22–32)
CO2: 28 mmol/L (ref 22–32)
Calcium: 8.4 mg/dL — ABNORMAL LOW (ref 8.9–10.3)
Calcium: 9 mg/dL (ref 8.9–10.3)
Chloride: 95 mmol/L — ABNORMAL LOW (ref 98–111)
Chloride: 90 mmol/L — ABNORMAL LOW (ref 98–111)
Creatinine, Ser: 0.51 mg/dL (ref 0.44–1.00)
Creatinine, Ser: 0.48 mg/dL (ref 0.44–1.00)
GFR, Estimated: >60 mL/min (ref >60)
GFR, Estimated: >60 mL/min (ref >60)
Glucose, Bld: 108 mg/dL — ABNORMAL HIGH (ref 70–99)
Glucose, Bld: 129 mg/dL — ABNORMAL HIGH (ref 70–99)
Potassium: 4.4 mmol/L (ref 3.5–5.1)
Potassium: 4.4 mmol/L (ref 3.5–5.1)
Sodium: 128 mmol/L — ABNORMAL LOW (ref 135–145)
Sodium: 127 mmol/L — ABNORMAL LOW (ref 135–145)

## 2022-01-19 LAB — CORTISOL-AM, BLOOD: Cortisol - AM: 6.7 ug/dL (ref 6.7–22.6)

## 2022-01-19 MED ORDER — SODIUM CHLORIDE 1 G PO TABS
1.0000 g | ORAL_TABLET | Freq: Three times a day (TID) | ORAL | Status: DC
  Administered 2022-01-19 – 2022-01-20 (×3): 1 g via ORAL
  Filled 2022-01-19 (×3): qty 1

## 2022-01-19 MED ORDER — COSYNTROPIN 0.25 MG IJ SOLR: 0.2500 mg | Freq: Once | INTRAMUSCULAR | Status: DC

## 2022-01-19 MED ORDER — IOHEXOL 9 MG/ML PO SOLN
500.0000 mL | ORAL | Status: AC
  Administered 2022-01-19 (×2): 500 mL via ORAL

## 2022-01-19 MED ORDER — IOHEXOL 300 MG/ML  SOLN
100.0000 mL | Freq: Once | INTRAMUSCULAR | Status: AC | PRN
  Administered 2022-01-19: 100 mL via INTRAVENOUS

## 2022-01-19 NOTE — Progress Notes (Signed)
Physical Therapy Treatment Patient Details Name: Sheryl Miller MRN: [id number] DOB: 05-04-1935 Today's Date: 01/19/2022   History of Present Illness 86 y/o female s/p L TKA 11/7.    PT Comments    Pt up in recliner and ready to work with PT on arrival.  [name] to go home but frustrated by continued low Na+ levels.  She did well with PT today.  She continues to have expected pain, but much more tolerable than it has been and ultimately she was able to easily circumambulate the nurses station with safe cadence.  She continues to have some weakness and stiffness in L knee but participates well with exercises showing good effort and overall tolerance.    Recommendations for follow up therapy are one component of a multi-disciplinary discharge planning process, led by the attending physician.  Recommendations may be updated based on patient status, additional functional criteria and insurance authorization.  Follow Up Recommendations  Follow physician's recommendations for discharge plan and follow up therapies     Assistance Recommended at Discharge Intermittent Supervision/Assistance  Patient can return home with the following A little help with walking and/or transfers;A little help with bathing/dressing/bathroom;Assistance with cooking/housework;Assist for transportation;Help with stairs or ramp for entrance   Equipment Recommendations  None recommended by PT    Recommendations for Other Services       Precautions / Restrictions Precautions Precautions: Fall Knee Immobilizer - Left: Other (comment) (in bed) Restrictions LLE Weight Bearing: Weight bearing as tolerated     Mobility  Bed Mobility               General bed mobility comments: in recliner on arrival    Transfers Overall transfer level: Modified independent Equipment used: Rolling walker (2 wheels)   Sit to Stand: Supervision           General transfer comment: still with minimal engagement of L  LE until upright, but able to rise with relative ease from recliner with b/l arm rest use    Ambulation/Gait Ambulation/Gait assistance: Supervision Gait Distance (Feet): 300 Feet Assistive device: Rolling walker (2 wheels)         General Gait Details: Pt continues to show improved consistency with ambulation, able to circumambualte the nurses' station (and side trip) with ease.  Vitals appropriate t/o, able to maintain walker momentum/consistent cadence and ultimately with good safety and confidence.   Stairs             Wheelchair Mobility    Modified Rankin (Stroke Patients Only)       Balance Overall balance assessment: Needs assistance Sitting-balance support: No upper extremity supported Sitting balance-Leahy Scale: Good     Standing balance support: Bilateral upper extremity supported, Reliant on assistive device for balance Standing balance-Leahy Scale: Good                              Cognition Arousal/Alertness: Awake/alert Behavior During Therapy: WFL for tasks assessed/performed Overall Cognitive Status: Within Functional Limits for tasks assessed                                          Exercises Total Joint Exercises Ankle Circles/Pumps: AROM, 10 reps Quad Sets: Strengthening, 10 reps Short Arc Quad: AROM, 10 reps Heel Slides: AROM, AAROM, 5 reps Hip ABduction/ADduction: Strengthening, 10 reps Long Arc Quad: AROM,  10 reps Knee Flexion: PROM, 5 reps Goniometric ROM: AROM to ~80*, gentle over pressure to ~85*    General Comments        Pertinent Vitals/Pain Pain Assessment Pain Assessment: 0-10 Pain Score: 3  Pain Location: L knee    Home Living                          Prior Function            PT Goals (current goals can now be found in the care plan section) Progress towards PT goals: Progressing toward goals    Frequency    BID      PT Plan Current plan remains appropriate     Co-evaluation              AM-PAC PT "6 Clicks" Mobility   Outcome Measure  Help needed turning from your back to your side while in a flat bed without using bedrails?: None Help needed moving from lying on your back to sitting on the side of a flat bed without using bedrails?: None Help needed moving to and from a bed to a chair (including a wheelchair)?: None Help needed standing up from a chair using your arms (e.g., wheelchair or bedside chair)?: None Help needed to walk in hospital room?: None Help needed climbing 3-5 steps with a railing? : None 6 Click Score: 24    End of Session Equipment Utilized During Treatment: Gait belt Activity Tolerance: Patient tolerated treatment well Patient left: in bed;with call bell/phone within reach;with bed alarm set;with family/visitor present Nurse Communication: Mobility status PT Visit Diagnosis: Muscle weakness (generalized) (M62.81);Difficulty in walking, not elsewhere classified (R26.2);Pain Pain - Right/Left: Left Pain - part of body: Knee     Time: 0822-0853 PT Time Calculation (min) (ACUTE ONLY): 31 min  Charges:  $Gait Training: 8-22 mins $Therapeutic Exercise: 8-22 mins                     Kreg Shropshire, DPT 01/19/2022, 10:23 AM

## 2022-01-19 NOTE — Consult Note (Addendum)
Initial Consultation Note   Patient: Sheryl Miller FMB:[id number] DOB: 1935-06-16 PCP: Teodora Medici, DO DOA: 01/14/2022 DOS: the patient was seen and examined on 01/19/2022 Primary service: Thornton Park, MD  Referring physician: Dr. Mack Guise Reason for consult: electrolyte abnormalities  Brief hospital summary:  Sheryl Miller is a 86 y.o. female with past medical history of hyperlipidemia, GERD, osteoporosis, left knee pain, who is admitted by Dr. Mack Guise of Ortho for left knee replacement 11/7. Post/op, patient was found to have hypocalcemia 6.4 and was asymptotic. [facility] was asked to consult for management. She also had hypokalemia and both were corrected with replacement.  Additionally, she was noticed to have persistent hyponatremia that was resistant to isotonic fluids.  Nephrology was consulted and prescribed tolvaptan. Hyponatremia is again resistant and she remains asymptomatic.  Further workup and monitoring is underway for etiology of her resistant hyponatremia including SIADH, malignancy.   Patient feels overall well. She continues to rehab from her surgery with PT.   Assessment/Plan:  S/P total knee arthroplasty, left 11/7: Has tolerated pain in the surgical site. -ortho managing, appreciate your care -analgesia PRN -Patient is on subcutaneous Lovenox for DVT prophylaxis  Hyponatremia- 129>>>128. resistant to isotonic IV fluids and one dose tolvaptan per nephrology. Patient denies dizziness, nausea, confusion or any other symptoms. GFR >60. Albumin 3.0 recently. SIADH workup underway. ACTH testing was not done this morning as ordered. Must be administered drawn tomorrow morning. Am cortisol pending.  - consulted nephrology, appreciate your care - continue isotonic fluids - continue to monitor q12hr - chest, abdomen, pelvis CT with contrast ordered UPDATE: chest CT has abnormal lesion in R middle lobe. Cannot r/u malignancy. Radiology recommendation f/u CT  chest non-contrast in 6-12 months. Given the setting of refractory hyponatremia and SIADH most likely on ddx currently, spoke with the radiologist on call who thinks the lesion may be large enough to be evaluated with PET scan as it is 64m at largest diameter. Will order PET scan outpatient and discuss possible false negatives/risks/benefits with patient.  Patient's goal is to be discharged home as soon as safely possible. I will start po salt tablets and continue to monitor na+ levels.   Hypocalcemia(improving s/p replacement): Ca++ 6.4>8.9. Vitamin D and PTH levels WNL. Albumin 3.0 -Continue home vitamin D supplement -continue Os-Cal supplement - continue to monitor   Hypokalemia (resolved s/p replacement): K+ 3.3>4.5. Mg++ and Phos levels WNL -monitor and replete PRN  HLD (hyperlipidemia) -Zocor   Normocytic anemia: Hemoglobin 12.1 on 01/14/2022. Stable and improved 9.6 (post-op) > 10.1 -Follow-up with CBC am   History of osteoporosis- -Vitamin D and Os-cal supplement  [facility] will continue to follow the patient.  Review of Systems: As mentioned in the history of present illness. All other systems reviewed and are negative. Past Medical History:  Diagnosis Date   Allergic rhinitis, cause unspecified    Breast cyst    Diverticulosis of colon (without mention of hemorrhage)    Esophageal reflux    Female stress incontinence    Internal hemorrhoids without mention of complication    Osteoporosis, unspecified    Other and unspecified hyperlipidemia    Raynaud's syndrome    Rheumatic fever    Unspecified constipation    Past Surgical History:  Procedure Laterality Date   ABDOMINAL HYSTERECTOMY     BLADDER REPAIR     BREAST BIOPSY Right 2000   fibrocystic disease   KNEE ARTHROSCOPY Right ~5/16   ROTATOR CUFF REPAIR  2006   SHOULDER ARTHROSCOPY  WITH ROTATOR CUFF REPAIR AND SUBACROMIAL DECOMPRESSION  2003   R, Scott Dean   TONSILLECTOMY     TONSILLECTOMY     TOTAL KNEE  ARTHROPLASTY Left 01/14/2022   Procedure: TOTAL KNEE ARTHROPLASTY;  Surgeon: Thornton Park, MD;  Location: [facility] ORS;  Service: Orthopedics;  Laterality: Left;   TUBAL LIGATION     Social History:  reports that she has never smoked. She has never used smokeless tobacco. She reports current alcohol use. She reports that she does not use drugs.  No Known Allergies  Family History  Problem Relation Age of Onset   Coronary artery disease Mother    Heart disease Mother    Diabetes Mother    COPD Father    Breast cancer Sister        2 (age 81?, age 6?); "Gene pos"   Diabetes Brother    Breast cancer Maternal Aunt        2   Stroke Paternal Grandmother    Diabetes Brother    Breast cancer Sister    Breast cancer Daughter        ? at age 28   Breast cancer Maternal Aunt    Breast cancer Niece    Breast cancer Niece     Prior to Admission medications   Medication Sig Start Date End Date Taking? Authorizing Provider  Ascorbic Acid (VITAMIN C) 1000 MG tablet Take 1,000 mg by mouth daily.   Yes [provider]  brimonidine (ALPHAGAN) 0.2 % ophthalmic solution Place 1 drop into both eyes 2 (two) times daily. 08/03/19  Yes [provider]  diclofenac Sodium (VOLTAREN) 1 % GEL Apply 2 g topically daily as needed.   Yes [provider]  fexofenadine (ALLEGRA) 180 MG tablet Take 180 mg by mouth daily.   Yes [provider]  gabapentin (NEURONTIN) 100 MG capsule Take 100 mg by mouth daily as needed.   Yes [provider]  ketoconazole (NIZORAL) 2 % shampoo MASSAGE INTO SCALP EVERY OTHER DAY, LET SIT SEVERAL MINUTES BEFORE RINSING. 09/30/21  Yes Brendolyn Patty, MD  melatonin 5 MG TABS Take 5 mg by mouth at bedtime.   Yes [provider]  mometasone (ELOCON) 0.1 % lotion APPLY TO AFFECTED AREAS OF SCALP TWICE DAILY UNTIL IMPROVED AND AS NEEDED FOR RECURRENCE. 01/07/22  Yes Ralene Bathe, MD  Multiple Vitamins-Minerals (CENTRUM SILVER ULTRA  WOMENS) TABS Take 1 tablet by mouth daily.    Yes [provider]  Probiotic Product (PROBIOTIC-10 PO) Take 1 capsule by mouth daily.   Yes [provider]  simvastatin (ZOCOR) 40 MG tablet TAKE 1 TABLET AT BEDTIME 04/22/21  Yes Viviana Simpler I, MD  timolol (TIMOPTIC) 0.5 % ophthalmic solution Place 1 drop into both eyes 2 (two) times daily. 07/26/19  Yes [provider]  traZODone (DESYREL) 150 MG tablet TAKE 1 TABLET AT BEDTIME AS NEEDED  FOR  SLEEP 03/18/21  Yes Viviana Simpler I, MD  TURMERIC PO Take 500 mg by mouth daily.   Yes [provider]  Ubiquinone (ULTRA COQ10 PO) Take 1 capsule by mouth daily.   Yes [provider]  vitamin D3 (CHOLECALCIFEROL) 25 MCG tablet Take 1,000 Units by mouth daily.   Yes [provider]    Physical Exam: Vitals:   01/17/22 0824 01/17/22 1446 01/17/22 2111 01/18/22 2328  BP: (!) 114/51 (!) 134/59 139/63 (!) 118/57  Pulse: 96 88 100 84  Resp: '17 17 20 17  '$ Temp: 98.3 F (  36.8 C) 97.8 F (36.6 C) 99.7 F (37.6 C) 98.2 F (36.8 C)  TempSrc:      SpO2: 94% 96% 93% 95%  Weight:      Height:       General: NAD, pleasant, able to participate in exam Cardiac: RRR, normal heart sounds, no murmurs. 2+ radial and PT pulses bilaterally Respiratory: CTAB, normal effort, No wheezes, rales or rhonchi Extremities: left knee in brace. Mild edema locally. No generalized edema Skin: warm and dry, no rashes noted Neuro: alert and oriented, no focal deficits Psych: Normal affect and mood  Data Reviewed:     Latest Ref Rng & Units 01/19/2022    5:42 AM 01/18/2022   10:38 AM 01/18/2022   12:12 AM  BMP  Glucose 70 - 99 mg/dL 108  165  114   BUN 8 - 23 mg/dL '15  13  13   '$ Creatinine 0.44 - 1.00 mg/dL 0.51  0.63  0.48   Sodium 135 - 145 mmol/L 128  130  129   Potassium 3.5 - 5.1 mmol/L 4.4  4.4  4.6   Chloride 98 - 111 mmol/L 95  93  94   CO2 22 - 32 mmol/L '29  31  29   '$ Calcium 8.9 - 10.3 mg/dL 8.4  8.8   8.4      Family Communication: none Primary team communication: discussed with primary Thank you very much for involving [place] in the care of your patient.  Author: Richarda Osmond, MD 01/19/2022 8:16 AM  For on call review [email or web address].

## 2022-01-20 DIAGNOSIS — E876 Hypokalemia: Secondary | ICD-10-CM | POA: Diagnosis not present

## 2022-01-20 DIAGNOSIS — Z96652 Presence of left artificial knee joint: Secondary | ICD-10-CM | POA: Diagnosis not present

## 2022-01-20 DIAGNOSIS — E871 Hypo-osmolality and hyponatremia: Secondary | ICD-10-CM | POA: Diagnosis not present

## 2022-01-20 DIAGNOSIS — R911 Solitary pulmonary nodule: Secondary | ICD-10-CM

## 2022-01-20 LAB — BASIC METABOLIC PANEL
Anion gap: 6 (ref 5–15)
BUN: 9 mg/dL (ref 8–23)
CO2: 29 mmol/L (ref 22–32)
Calcium: 8.9 mg/dL (ref 8.9–10.3)
Chloride: 95 mmol/L — ABNORMAL LOW (ref 98–111)
Creatinine, Ser: 0.56 mg/dL (ref 0.44–1.00)
GFR, Estimated: >60 mL/min (ref >60)
Glucose, Bld: 105 mg/dL — ABNORMAL HIGH (ref 70–99)
Potassium: 4.9 mmol/L (ref 3.5–5.1)
Sodium: 130 mmol/L — ABNORMAL LOW (ref 135–145)

## 2022-01-20 LAB — ACTH STIMULATION, 3 TIME POINTS
Cortisol, 30 Min: 23.5 ug/dL
Cortisol, 60 Min: 24.7 ug/dL
Cortisol, Base: 10.6 ug/dL

## 2022-01-20 MED ORDER — SODIUM CHLORIDE 1 G PO TABS: 1.0000 g | ORAL_TABLET | Freq: Three times a day (TID) | ORAL | 0 refills | Status: AC

## 2022-01-20 MED ORDER — COSYNTROPIN 0.25 MG IJ SOLR
0.2500 mg | Freq: Once | INTRAMUSCULAR | Status: DC
  Filled 2022-01-20: qty 0.25

## 2022-01-20 MED ORDER — TRAMADOL HCL 50 MG PO TABS: 50.0000 mg | ORAL_TABLET | Freq: Four times a day (QID) | ORAL | 0 refills | Status: AC | PRN

## 2022-01-20 MED ORDER — COSYNTROPIN 0.25 MG IJ SOLR
0.2500 mg | Freq: Once | INTRAMUSCULAR | Status: AC
  Administered 2022-01-20: 0.25 mg via INTRAVENOUS
  Filled 2022-01-20: qty 0.25

## 2022-01-20 MED ORDER — ENOXAPARIN SODIUM 40 MG/0.4ML IJ SOSY: 40.0000 mg | PREFILLED_SYRINGE | INTRAMUSCULAR | 0 refills | Status: AC

## 2022-01-20 NOTE — Discharge Summary (Signed)
Physician Discharge Summary  Patient ID: Sheryl Miller MRN: [id number] DOB/AGE: 1935-12-14 86 y.o.  Admit date: 01/14/2022 Discharge date: 01/20/2022  Admission Diagnoses:  Left Knee Osteoarthritis S/P total knee arthroplasty, left  Discharge Diagnoses:  Left Knee Osteoarthritis Principal Problem:   S/P total knee arthroplasty, left Active Problems:   HLD (hyperlipidemia)   Hypocalcemia   Hypokalemia   Normocytic anemia   OP (osteoporosis)   Hyponatremia   Acute hyponatremia   Lesion of right lung   Past Medical History:  Diagnosis Date   Allergic rhinitis, cause unspecified    Breast cyst    Diverticulosis of colon (without mention of hemorrhage)    Esophageal reflux    Female stress incontinence    Internal hemorrhoids without mention of complication    Osteoporosis, unspecified    Other and unspecified hyperlipidemia    Raynaud's syndrome    Rheumatic fever    Unspecified constipation     Surgeries: Procedure(s): TOTAL KNEE ARTHROPLASTY on 01/14/2022   Consultants (if any): Treatment Team:  Sheryl Park, MD Richarda Osmond, MD Murlean Iba, MD Nephrology  Discharged Condition: Improved  Hospital Course: Sheryl Miller is an 86 y.o. female who was admitted 01/14/2022 with a diagnosis of  Left Knee Osteoarthritis S/P total knee arthroplasty, left and went to the operating room on 01/14/2022 and underwent an uncomplicated left total knee arthroplasty.  She was admitted postop for pain control, neurovascular monitoring, postop antibiotics and physical therapy.  The patient's postoperative course was complicated by electrolyte abnormalities including hypocalcemia, hypokalemia, hyponatremia.  The hospitalist service and nephrology were consulted.  Patient had a CT scan showing a right middle lobe lesion which will need follow-up with an outpatient PET scan.  Patient improved during her hospitalization with physical therapy and was recommended for home  with home health PT.Marland Kitchen    She was given perioperative antibiotics:  Anti-infectives (From admission, onward)    Start     Dose/Rate Route Frequency Ordered Stop   01/14/22 1007  ceFAZolin (ANCEF) 2-4 GM/100ML-% IVPB       Note to Pharmacy: Jeanene Erb E: cabinet override      01/14/22 1007 01/14/22 1123   01/14/22 0915  ceFAZolin (ANCEF) IVPB 2g/100 mL premix        2 g 200 mL/hr over 30 Minutes Intravenous On call to O.R. 01/14/22 0902 01/14/22 1127     .  She was given sequential compression devices, early ambulation, and lovenox for DVT prophylaxis.  She benefited maximally from the hospital stay and there were no complications.    Recent vital signs:  Vitals:   01/20/22 0002 01/20/22 0722  BP: 122/61 128/64  Pulse: 78 76  Resp: 16 18  Temp: 97.8 F (36.6 C) 98.1 F (36.7 C)  SpO2: 92% 94%    Recent laboratory studies:  Lab Results  Component Value Date   HGB 10.1 (L) 01/16/2022   HGB 9.6 (L) 01/15/2022   HGB 12.1 01/14/2022   Lab Results  Component Value Date   WBC 8.8 01/16/2022   PLT 217 01/16/2022   Lab Results  Component Value Date   INR 1.0 01/06/2022   Lab Results  Component Value Date   NA 130 (L) 01/20/2022   K 4.9 01/20/2022   CL 95 (L) 01/20/2022   CO2 29 01/20/2022   BUN 9 01/20/2022   CREATININE 0.56 01/20/2022   GLUCOSE 105 (H) 01/20/2022    Discharge Medications:   Allergies as of 01/20/2022   No  Known Allergies      Medication List     STOP taking these medications    traZODone 150 MG tablet Commonly known as: DESYREL       TAKE these medications    brimonidine 0.2 % ophthalmic solution Commonly known as: ALPHAGAN Place 1 drop into both eyes 2 (two) times daily.   Centrum Silver Ultra Womens Tabs Take 1 tablet by mouth daily.   diclofenac Sodium 1 % Gel Commonly known as: VOLTAREN Apply 2 g topically daily as needed.   enoxaparin 40 MG/0.4ML injection Commonly known as: LOVENOX Inject 0.4 mLs (40 mg  total) into the skin daily for 14 doses.   fexofenadine 180 MG tablet Commonly known as: ALLEGRA Take 180 mg by mouth daily.   gabapentin 100 MG capsule Commonly known as: NEURONTIN Take 100 mg by mouth daily as needed.   ketoconazole 2 % shampoo Commonly known as: NIZORAL MASSAGE INTO SCALP EVERY OTHER DAY, LET SIT SEVERAL MINUTES BEFORE RINSING.   melatonin 5 MG Tabs Take 5 mg by mouth at bedtime.   mometasone 0.1 % lotion Commonly known as: ELOCON APPLY TO AFFECTED AREAS OF SCALP TWICE DAILY UNTIL IMPROVED AND AS NEEDED FOR RECURRENCE.   PROBIOTIC-10 PO Take 1 capsule by mouth daily.   simvastatin 40 MG tablet Commonly known as: ZOCOR TAKE 1 TABLET AT BEDTIME   sodium chloride 1 g tablet Take 1 tablet (1 g total) by mouth 3 (three) times daily with meals.   timolol 0.5 % ophthalmic solution Commonly known as: TIMOPTIC Place 1 drop into both eyes 2 (two) times daily.   traMADol 50 MG tablet Commonly known as: ULTRAM Take 1 tablet (50 mg total) by mouth every 6 (six) hours as needed for moderate pain.   TURMERIC PO Take 500 mg by mouth daily.   ULTRA COQ10 PO Take 1 capsule by mouth daily.   vitamin C 1000 MG tablet Take 1,000 mg by mouth daily.   vitamin D3 25 MCG tablet Commonly known as: CHOLECALCIFEROL Take 1,000 Units by mouth daily.        Diagnostic Studies: CT CHEST ABDOMEN PELVIS W CONTRAST  Addendum Date: 01/20/2022   ADDENDUM REPORT: 01/20/2022 09:22 ADDENDUM: These results will be called to the ordering clinician or representative by the Radiologist Assistant, and communication documented in the PACS or [employer]. Electronically Signed   By: Marin Olp M.D.   On: 01/20/2022 09:22   Addendum Date: 01/20/2022   ADDENDUM REPORT: 01/20/2022 08:40 ADDENDUM: Upon further review, because patient is symptomatic, would recommend further evaluation of the right middle lobe nodule with pulmonary consultation and possible tissue sampling.  Electronically Signed   By: Marin Olp M.D.   On: 01/20/2022 08:40   Result Date: 01/20/2022 CLINICAL DATA:  Suspect occult malignancy. SIADH. Evaluate adrenal glands and lungs. EXAM: CT CHEST, ABDOMEN, AND PELVIS WITH CONTRAST TECHNIQUE: Multidetector CT imaging of the chest, abdomen and pelvis was performed following the standard protocol during bolus administration of intravenous contrast. RADIATION DOSE REDUCTION: This exam was performed according to the departmental dose-optimization program which includes automated exposure control, adjustment of the mA and/or kV according to patient size and/or use of iterative reconstruction technique. CONTRAST:  135m OMNIPAQUE IOHEXOL 300 MG/ML  SOLN COMPARISON:  None Available. FINDINGS: CT CHEST FINDINGS Cardiovascular: Borderline cardiomegaly. Calcified plaque over the left anterior descending coronary artery and right coronary arteries. Calcification just inferior to the aortic root. No evidence of aortic aneurysm or dissection. Moderate calcified plaque over  the descending thoracic aorta. Pulmonary arterial system and remaining vascular structures are unremarkable. Mediastinum/Nodes: Goiter. No mediastinal or hilar adenopathy. Remaining mediastinal structures are unremarkable. Lungs/Pleura: Lungs are adequately inflated. Minimal biapical pleural thickening is present. No focal airspace consolidation or effusion. Mild linear scarring/atelectasis over the lingula and left base. Small irregular bordered nodule over the right middle lobe (image 90, series 4) measuring 6-7 mm (7.8 x 5.7 mm). Airways are normal. Musculoskeletal: Mild loss of height over the mid anterior the T3 vertebral body CT ABDOMEN PELVIS FINDINGS Hepatobiliary: Liver, gallbladder and biliary tree are normal. Pancreas: Normal. Spleen: Normal. Adrenals/Urinary Tract: Adrenal glands are normal. Kidneys are normal in size without hydronephrosis or nephrolithiasis. Ureters and bladder are  unremarkable. Stomach/Bowel: Stomach and small bowel are unremarkable. Appendix not visualized. Moderate diverticulosis of the descending and sigmoid colon without active inflammation. Vascular/Lymphatic: Calcified plaque over the abdominal aorta without evidence of aneurysm. No adenopathy. Reproductive: Previous hysterectomy. Other: No free fluid or focal inflammatory change. Mild air over the subcutaneous fat of the right anterior abdominal wall Musculoskeletal: Mild spondylosis of the spine. Minimal degenerative change of the hips and symphysis pubis joint. IMPRESSION: 1. No acute findings in the chest, abdomen or pelvis. 2. 6-7 mm irregular bordered right middle lobe pulmonary nodule. Non-contrast chest CT at 6-12 months is recommended. If the nodule is stable at time of repeat CT, then future CT at 18-24 months (from today's scan) is considered optional for low-risk patients, but is recommended for high-risk patients. This recommendation follows the consensus statement: Guidelines for Management of Incidental Pulmonary Nodules Detected on CT Images: From the Fleischner Society 2017; Radiology 2017; 284:228-243. 3. Moderate diverticulosis of the descending and sigmoid colon without active inflammation. 4. Goiter. 5. Mild loss of height over the mid anterior the T3 vertebral body, age indeterminate. 6. Aortic atherosclerosis.  Atherosclerotic coronary artery disease. Aortic Atherosclerosis (ICD10-I70.0). Electronically Signed: By: Marin Olp M.D. On: 01/19/2022 14:47   DG Knee Left Port  Result Date: 01/14/2022 CLINICAL DATA:  Follow-up total knee arthroplasty EXAM: PORTABLE LEFT KNEE - 1-2 VIEW COMPARISON:  None Available. FINDINGS: Total knee arthroplasty components appear well positioned. No unexpected finding. IMPRESSION: Good appearance following total knee arthroplasty. Electronically Signed   By: Nelson Chimes M.D.   On: 01/14/2022 14:29    Disposition: Discharge disposition: 01-Home or Self  Care       Discharge Instructions     Call MD / Call 911   Complete by: As directed    If you experience chest pain or shortness of breath, CALL 911 and be transported to the hospital emergency room.  If you develope a fever above 101 F, pus (white drainage) or increased drainage or redness at the wound, or calf pain, call your surgeon's office.   Constipation Prevention   Complete by: As directed    Drink plenty of fluids.  Prune juice may be helpful.  You may use a stool softener, such as Colace (over the counter) 100 mg twice a day.  Use MiraLax (over the counter) for constipation as needed.   Diet general   Complete by: As directed    Discharge instructions   Complete by: As directed    The patient will follow-up with Dr. Mack Guise in the office on 01-29-2022 @ 11:30 AM  The patient may continue to bear weight on the left lower extremity with use of a walker. The patient should continue to use TED stockings until follow-up. Patient should remove the TED stockings at  night for sleep. The patient needs to continue to elevate the left lower extremity whenever possible. The knee immobilizer should be used at night. The patient may remove the knee immobilizer to perform exercises or sit in a chair during the day.  Patient should not place a pillow under their knee. The Polar Care may be used by the patient for comfort.  The dressing should remain on until follow up in the office.   The patient must cover the left knee dressing/incision during showers with a plastic bag or Saran wrap.  The patient will take lovenox 40 mg once a day for blood clot prevention and continue to work on knee range of motion exercises at home as instructed by physical therapy until follow-up in the office.   Driving restrictions   Complete by: As directed    No driving for at least 6-8 weeks after surgery   Increase activity slowly as tolerated   Complete by: As directed    Lifting restrictions   Complete by: As  directed    No lifting for 12-16 weeks   Post-operative opioid taper instructions:   Complete by: As directed    POST-OPERATIVE OPIOID TAPER INSTRUCTIONS: It is important to wean off of your opioid medication as soon as possible. If you do not need pain medication after your surgery it is ok to stop day one. Opioids include: Codeine, Hydrocodone(Norco, Vicodin), Oxycodone(Percocet, oxycontin) and hydromorphone amongst others.  Long term and even short term use of opiods can cause: Increased pain response Dependence Constipation Depression Respiratory depression And more.  Withdrawal symptoms can include Flu like symptoms Nausea, vomiting And more Techniques to manage these symptoms Hydrate well Eat regular healthy meals Stay active Use relaxation techniques(deep breathing, meditating, yoga) Do Not substitute Alcohol to help with tapering If you have been on opioids for less than two weeks and do not have pain than it is ok to stop all together.  Plan to wean off of opioids This plan should start within one week post op of your joint replacement. Maintain the same interval or time between taking each dose and first decrease the dose.  Cut the total daily intake of opioids by one tablet each day Next start to increase the time between doses. The last dose that should be eliminated is the evening dose.             Signed: Thornton Miller ,MD 01/20/2022, 1:08 PM

## 2022-01-20 NOTE — Plan of Care (Signed)
  Problem: Activity: Goal: Ability to avoid complications of mobility impairment will improve Outcome: Progressing   Problem: Activity: Goal: Range of joint motion will improve Outcome: Progressing   Problem: Pain Management: Goal: Pain level will decrease with appropriate interventions Outcome: Progressing   Problem: Health Behavior/Discharge Planning: Goal: Ability to manage health-related needs will improve Outcome: Progressing   Problem: Skin Integrity: Goal: Risk for impaired skin integrity will decrease Outcome: Progressing

## 2022-01-20 NOTE — Progress Notes (Addendum)
Physical Therapy Treatment Patient Details Name: Sheryl Miller MRN: [id number] DOB: 1935-12-07 Today's Date: 01/20/2022   History of Present Illness 86 y/o female s/p L TKA 11/7.    PT Comments    Pt found in chair upon PT entry with c/o 5/10 pain in left knee s/p TKA. Sit<>Stand with supervision and RW. Pt ambulated 400 ft Mod I with RW with no LOB or VC for RW safety. Pt able to maintain static standing balance with no UE support during administration of eye drops. Pt ascended/descended 4 steps Mod I sideways with a step to pattern and left unilateral railing. Pt able to verbalize/demonstrate stair education. Pt would benefit from skilled physical therapy to address the listed deficits (see below) to increase independence with ADLs and function. Current recommendation is HHPT with intermittent assistance to return pt to PLOF.      Recommendations for follow up therapy are one component of a multi-disciplinary discharge planning process, led by the attending physician.  Recommendations may be updated based on patient status, additional functional criteria and insurance authorization.  Follow Up Recommendations  Home health PT     Assistance Recommended at Discharge Intermittent Supervision/Assistance  Patient can return home with the following A little help with walking and/or transfers;A little help with bathing/dressing/bathroom;Assistance with cooking/housework;Assist for transportation;Help with stairs or ramp for entrance   Equipment Recommendations  None recommended by PT    Recommendations for Other Services       Precautions / Restrictions Precautions Precautions: Fall Required Braces or Orthoses: Knee Immobilizer - Left Knee Immobilizer - Left: Other (comment) (in bed) Restrictions Weight Bearing Restrictions: Yes LLE Weight Bearing: Weight bearing as tolerated     Mobility  Bed Mobility                    Transfers Overall transfer level: Needs  assistance Equipment used: Rolling walker (2 wheels) Transfers: Sit to/from Stand Sit to Stand: Supervision           General transfer comment: minimal weight put through left LE until standing    Ambulation/Gait Ambulation/Gait assistance: Modified independent (Device/Increase time) Gait Distance (Feet): 400 Feet Assistive device: Rolling walker (2 wheels) Gait Pattern/deviations: Step-through pattern, Decreased weight shift to left       General Gait Details: no VC required for RW safety   Stairs Stairs: Yes Stairs assistance: Modified independent (Device/Increase time) Stair Management: One rail Left, Step to pattern Number of Stairs: 4 General stair comments: no vc required. pt negotiated stairs sideways with left rail   Wheelchair Mobility    Modified Rankin (Stroke Patients Only)       Balance Overall balance assessment: Needs assistance Sitting-balance support: No upper extremity supported, Feet supported Sitting balance-Leahy Scale: Good     Standing balance support: Bilateral upper extremity supported Standing balance-Leahy Scale: Good Standing balance comment: no LOB and able to static stand without UE support                            Cognition Arousal/Alertness: Awake/alert Behavior During Therapy: WFL for tasks assessed/performed Overall Cognitive Status: Within Functional Limits for tasks assessed                                 General Comments: pt recall WNL today for stair education        Exercises  General Comments        Pertinent Vitals/Pain Pain Assessment Pain Score: 5  Pain Location: L knee Pain Descriptors / Indicators: Discomfort, Aching Pain Intervention(s): Ice applied, Monitored during session, Premedicated before session    Home Living                          Prior Function            PT Goals (current goals can now be found in the care plan section) Acute Rehab PT  Goals Patient Stated Goal: to go home PT Goal Formulation: With patient Time For Goal Achievement: 01/27/22 Potential to Achieve Goals: Good Progress towards PT goals: Progressing toward goals    Frequency    BID      PT Plan Discharge plan needs to be updated    Co-evaluation              AM-PAC PT "6 Clicks" Mobility   Outcome Measure  Help needed turning from your back to your side while in a flat bed without using bedrails?: None Help needed moving from lying on your back to sitting on the side of a flat bed without using bedrails?: None Help needed moving to and from a bed to a chair (including a wheelchair)?: None Help needed standing up from a chair using your arms (e.g., wheelchair or bedside chair)?: None Help needed to walk in hospital room?: None Help needed climbing 3-5 steps with a railing? : None 6 Click Score: 24    End of Session Equipment Utilized During Treatment: Gait belt Activity Tolerance: Patient tolerated treatment well Patient left: in chair;with call bell/phone within reach Nurse Communication: Mobility status PT Visit Diagnosis: Muscle weakness (generalized) (M62.81);Difficulty in walking, not elsewhere classified (R26.2);Pain Pain - Right/Left: Left Pain - part of body: Knee     Time: 6720-9470 PT Time Calculation (min) (ACUTE ONLY): 26 min  Charges:  $Gait Training: 8-22 mins $Therapeutic Activity: 8-22 mins                     [employer], SPT  01/20/2022, 10:19 AM

## 2022-01-20 NOTE — Progress Notes (Signed)
Discharge instructions given to patient. Patient verbalizes understanding. Thigh high TED hose applied on both legs. Patient friend providing transportation. Patient wheeled out by staff.

## 2022-01-20 NOTE — Discharge Instructions (Signed)
Your sodium level is 130 today which is still lower than normal range but is improved to a safer level. Please continue to take a salt tablet with each meal (three times per day). Follow up with your primary care doctor this week to recheck your sodium levels and discuss ordering a PET scan.  The scan is to evaluate the lesion seen on your right lung. Because the lesion is very small, it is possible to have a false negative result on the PET scan.  So, even if you have a negative PET scan, I still recommend getting a repeat chest CT in 6-12 months.

## 2022-01-20 NOTE — TOC Progression Note (Signed)
Transition of Care [facility]) - Progression Note    Patient Details  Name: Sheryl Miller MRN: [id number] Date of Birth: 02/18/1936  Transition of [facility] CM/SW [place], RN Phone Number: 01/20/2022, 1:51 PM  Clinical Narrative:     Alvis Lemmings accepted th patient for [facility], NO dme needed  Expected Discharge Plan: [place] Barriers to Discharge: Continued Medical Work up  Expected Discharge Plan and Services Expected Discharge Plan: [place]   Discharge Planning Services: CM Consult   Living arrangements for the past 2 months: Single Family Home Expected Discharge Date: 01/20/22               DME Arranged: N/A DME Agency: NA                   Social Determinants of Health (SDOH) Interventions Housing Interventions: Intervention Not Indicated  Readmission Risk Interventions     No data to display

## 2022-01-20 NOTE — Consult Note (Signed)
Initial Consultation Note   Patient: Sheryl Miller WNI:[id number] DOB: 05-23-1935 PCP: Teodora Medici, DO DOA: 01/14/2022 DOS: the patient was seen and examined on 01/20/2022 Primary service: Thornton Park, MD  Referring physician: Dr. Mack Guise Reason for consult: electrolyte abnormalities  Brief hospital summary:  Jazari Ober Krauter is a 86 y.o. female with past medical history of hyperlipidemia, GERD, osteoporosis, left knee pain, who is admitted by Dr. Mack Guise of Ortho for left knee replacement 11/7. Post/op, patient was found to have hypocalcemia 6.4 and was asymptotic. [facility] was asked to consult for management. She also had hypokalemia and both were corrected with replacement.  Additionally, she was noticed to have persistent hyponatremia that was resistant to isotonic fluids.  Nephrology was consulted and prescribed tolvaptan. Hyponatremia is again resistant and she remains asymptomatic.  Further workup and monitoring is underway for etiology of her resistant hyponatremia including SIADH, malignancy.   Patient feels overall well. She continues to rehab from her surgery with PT.   Assessment/Plan:  S/P total knee arthroplasty, left 11/7: Has tolerated pain in the surgical site. -ortho managing, appreciate your care -analgesia PRN -Patient is on subcutaneous Lovenox for DVT prophylaxis  Hyponatremia- 129>>>127>>130. resistant to isotonic IV fluids and one dose tolvaptan per nephrology. Patient denies dizziness, nausea, confusion or any other symptoms. GFR >60. Albumin 3.0 this admission. SIADH workup underway. ACTH testing was not done yesterday morning as ordered. Drawn today and will complete workup.  Patient is at goal for discharge of Na+ 130 now with PO salt tablets- 1g TID.  - nephrology signed off s/p one dose tolvaptan  - continue PO salt tablets. Chest CT has abnormal lesion in R middle lobe. Cannot r/u malignancy. Radiology recommendation f/u CT chest  non-contrast in 6-12 months. Given the setting of refractory hyponatremia and SIADH most likely on ddx currently, spoke with the radiologist on call who thinks the lesion may be large enough to be evaluated with PET scan as it is 65m at largest diameter. Will order PET scan outpatient. Discussed with patient that she will need to follow up results with her PCP and high risk of false negatives given the size of the lesion so she will still need to get follow up CT scan in 6-12 months if the PET scan is negative.  Will need close follow up with PCP to monitor her sodium levels this week.  She expressed understanding and agreement with this plan.    Hypocalcemia(improving s/p replacement): Ca++ 6.4>8.9. Vitamin D and PTH levels WNL. Albumin 3.0 -Continue home vitamin D supplement -continue Os-Cal supplement - continue to monitor   Hypokalemia (resolved s/p replacement): K+ 3.3>4.5>4.9 on day of discharge. Mg++ and Phos levels WNL -monitor and replete PRN  HLD (hyperlipidemia) -Zocor   Normocytic anemia: Hemoglobin 12.1 on 01/14/2022. Stable and improved 9.6 (post-op) > 10.1 -Follow-up with CBC am   History of osteoporosis- -Vitamin D and Os-cal supplement  TRH will sign off at present, please call [place] when needed.  Review of Systems: As mentioned in the history of present illness. All other systems reviewed and are negative. Past Medical History:  Diagnosis Date   Allergic rhinitis, cause unspecified    Breast cyst    Diverticulosis of colon (without mention of hemorrhage)    Esophageal reflux    Female stress incontinence    Internal hemorrhoids without mention of complication    Osteoporosis, unspecified    Other and unspecified hyperlipidemia    Raynaud's syndrome    Rheumatic fever  Unspecified constipation    Past Surgical History:  Procedure Laterality Date   ABDOMINAL HYSTERECTOMY     BLADDER REPAIR     BREAST BIOPSY Right 2000   fibrocystic disease   KNEE  ARTHROSCOPY Right ~5/16   ROTATOR CUFF REPAIR  2006   SHOULDER ARTHROSCOPY WITH ROTATOR CUFF REPAIR AND SUBACROMIAL DECOMPRESSION  2003   R, Scott Dean   TONSILLECTOMY     TONSILLECTOMY     TOTAL KNEE ARTHROPLASTY Left 01/14/2022   Procedure: TOTAL KNEE ARTHROPLASTY;  Surgeon: Thornton Park, MD;  Location: [facility] ORS;  Service: Orthopedics;  Laterality: Left;   TUBAL LIGATION     Social History:  reports that she has never smoked. She has never used smokeless tobacco. She reports current alcohol use. She reports that she does not use drugs.  No Known Allergies  Family History  Problem Relation Age of Onset   Coronary artery disease Mother    Heart disease Mother    Diabetes Mother    COPD Father    Breast cancer Sister        2 (age 67?, age 38?); "Gene pos"   Diabetes Brother    Breast cancer Maternal Aunt        2   Stroke Paternal Grandmother    Diabetes Brother    Breast cancer Sister    Breast cancer Daughter        ? at age 8   Breast cancer Maternal Aunt    Breast cancer Niece    Breast cancer Niece     Prior to Admission medications   Medication Sig Start Date End Date Taking? Authorizing Provider  Ascorbic Acid (VITAMIN C) 1000 MG tablet Take 1,000 mg by mouth daily.   Yes [provider]  brimonidine (ALPHAGAN) 0.2 % ophthalmic solution Place 1 drop into both eyes 2 (two) times daily. 08/03/19  Yes [provider]  diclofenac Sodium (VOLTAREN) 1 % GEL Apply 2 g topically daily as needed.   Yes [provider]  fexofenadine (ALLEGRA) 180 MG tablet Take 180 mg by mouth daily.   Yes [provider]  gabapentin (NEURONTIN) 100 MG capsule Take 100 mg by mouth daily as needed.   Yes [provider]  ketoconazole (NIZORAL) 2 % shampoo MASSAGE INTO SCALP EVERY OTHER DAY, LET SIT SEVERAL MINUTES BEFORE RINSING. 09/30/21  Yes Brendolyn Patty, MD  melatonin 5 MG TABS Take 5 mg by mouth at bedtime.   Yes [provider]   mometasone (ELOCON) 0.1 % lotion APPLY TO AFFECTED AREAS OF SCALP TWICE DAILY UNTIL IMPROVED AND AS NEEDED FOR RECURRENCE. 01/07/22  Yes Ralene Bathe, MD  Multiple Vitamins-Minerals (CENTRUM SILVER ULTRA WOMENS) TABS Take 1 tablet by mouth daily.    Yes [provider]  Probiotic Product (PROBIOTIC-10 PO) Take 1 capsule by mouth daily.   Yes [provider]  simvastatin (ZOCOR) 40 MG tablet TAKE 1 TABLET AT BEDTIME 04/22/21  Yes Viviana Simpler I, MD  timolol (TIMOPTIC) 0.5 % ophthalmic solution Place 1 drop into both eyes 2 (two) times daily. 07/26/19  Yes [provider]  traZODone (DESYREL) 150 MG tablet TAKE 1 TABLET AT BEDTIME AS NEEDED  FOR  SLEEP 03/18/21  Yes Viviana Simpler I, MD  TURMERIC PO Take 500 mg by mouth daily.   Yes [provider]  Ubiquinone (ULTRA COQ10 PO) Take 1 capsule by mouth daily.   Yes [provider]  vitamin D3 (CHOLECALCIFEROL) 25 MCG tablet Take 1,000  Units by mouth daily.   Yes [provider]    Physical Exam: Vitals:   01/19/22 0818 01/19/22 1550 01/20/22 0002 01/20/22 0722  BP: (!) 144/67 137/65 122/61 128/64  Pulse: 78 88 78 76  Resp: '17 17 16 18  '$ Temp: 98.1 F (36.7 C) 98.2 F (36.8 C) 97.8 F (36.6 C) 98.1 F (36.7 C)  TempSrc:    Oral  SpO2: 94% 99% 92% 94%  Weight:      Height:       General: NAD, pleasant, able to participate in exam Cardiac: RRR, normal heart sounds, no murmurs. 2+ radial and PT pulses bilaterally Respiratory: CTAB, normal effort, No wheezes, rales or rhonchi Extremities: left knee in brace. Mild edema locally. No generalized edema Skin: warm and dry, no rashes noted Neuro: alert and oriented, no focal deficits Psych: Normal affect and mood  Data Reviewed:     Latest Ref Rng & Units 01/20/2022    5:15 AM 01/19/2022    3:41 PM 01/19/2022    5:42 AM  BMP  Glucose 70 - 99 mg/dL 105  129  108   BUN 8 - 23 mg/dL '9  12  15   '$ Creatinine 0.44 - 1.00 mg/dL 0.56   0.48  0.51   Sodium 135 - 145 mmol/L 130  127  128   Potassium 3.5 - 5.1 mmol/L 4.9  4.4  4.4   Chloride 98 - 111 mmol/L 95  90  95   CO2 22 - 32 mmol/L '29  28  29   '$ Calcium 8.9 - 10.3 mg/dL 8.9  9.0  8.4      Family Communication: none Primary team communication: discussed with primary Thank you very much for involving [place] in the care of your patient.  Author: Richarda Osmond, MD 01/20/2022 8:21 AM  For on call review [email or web address].

## 2022-01-20 NOTE — Care Management Important Message (Signed)
Important Message  Patient Details  Name: Sheryl Miller MRN: [id number] Date of Birth: Mar 13, 1935   Medicare Important Message Given:  Yes     Juliann Pulse A Aasim Restivo 01/20/2022, 1:31 PM

## 2022-01-20 NOTE — Progress Notes (Signed)
Subjective:  POD #6 s/p left total knee arthroplasty.   Patient's postoperative course has been complicated by electrolyte abnormalities including hyponatremia, hypokalemia and hypocalcemia.  Patient has been consulted by the hospitalist and the nephrology service.  A CT scan was performed which shows a right middle lobe nodule.  Patient will get an outpatient PET scan in follow-up follow-up with her PCP.  Patient reports left knee pain as mild.  Patient sitting up in a chair today.  Objective:   VITALS:   Vitals:   01/19/22 0818 01/19/22 1550 01/20/22 0002 01/20/22 0722  BP: (!) 144/67 137/65 122/61 128/64  Pulse: 78 88 78 76  Resp: '17 17 16 18  '$ Temp: 98.1 F (36.7 C) 98.2 F (36.8 C) 97.8 F (36.6 C) 98.1 F (36.7 C)  TempSrc:    Oral  SpO2: 94% 99% 92% 94%  Weight:      Height:        PHYSICAL EXAM: Left lower extremity: I personally change the patient's dressing today.  Her incisions clean dry and intact.  Patient has significant ecchymosis and generalized swelling without a large hemarthrosis.  Her leg compartments are soft and compressible. Neurovascular intact Sensation intact distally Intact pulses distally Dorsiflexion/Plantar flexion intact Incision: no drainage No cellulitis present Compartment soft  LABS  Results for orders placed or performed during the hospital encounter of 01/14/22 (from the past 24 hour(s))  Basic metabolic panel     Status: Abnormal   Collection Time: 01/19/22  3:41 PM  Result Value Ref Range   Sodium 127 (L) 135 - 145 mmol/L   Potassium 4.4 3.5 - 5.1 mmol/L   Chloride 90 (L) 98 - 111 mmol/L   CO2 28 22 - 32 mmol/L   Glucose, Bld 129 (H) 70 - 99 mg/dL   BUN 12 8 - 23 mg/dL   Creatinine, Ser 0.48 0.44 - 1.00 mg/dL   Calcium 9.0 8.9 - 10.3 mg/dL   GFR, Estimated >60 >60 mL/min   Anion gap 9 5 - 15  Basic metabolic panel     Status: Abnormal   Collection Time: 01/20/22  5:15 AM  Result Value Ref Range   Sodium 130 (L) 135 - 145  mmol/L   Potassium 4.9 3.5 - 5.1 mmol/L   Chloride 95 (L) 98 - 111 mmol/L   CO2 29 22 - 32 mmol/L   Glucose, Bld 105 (H) 70 - 99 mg/dL   BUN 9 8 - 23 mg/dL   Creatinine, Ser 0.56 0.44 - 1.00 mg/dL   Calcium 8.9 8.9 - 10.3 mg/dL   GFR, Estimated >60 >60 mL/min   Anion gap 6 5 - 15    CT CHEST ABDOMEN PELVIS W CONTRAST  Addendum Date: 01/20/2022   ADDENDUM REPORT: 01/20/2022 09:22 ADDENDUM: These results will be called to the ordering clinician or representative by the Radiologist Assistant, and communication documented in the PACS or [employer]. Electronically Signed   By: Marin Olp M.D.   On: 01/20/2022 09:22   Addendum Date: 01/20/2022   ADDENDUM REPORT: 01/20/2022 08:40 ADDENDUM: Upon further review, because patient is symptomatic, would recommend further evaluation of the right middle lobe nodule with pulmonary consultation and possible tissue sampling. Electronically Signed   By: Marin Olp M.D.   On: 01/20/2022 08:40   Result Date: 01/20/2022 CLINICAL DATA:  Suspect occult malignancy. SIADH. Evaluate adrenal glands and lungs. EXAM: CT CHEST, ABDOMEN, AND PELVIS WITH CONTRAST TECHNIQUE: Multidetector CT imaging of the chest, abdomen and pelvis was performed  following the standard protocol during bolus administration of intravenous contrast. RADIATION DOSE REDUCTION: This exam was performed according to the departmental dose-optimization program which includes automated exposure control, adjustment of the mA and/or kV according to patient size and/or use of iterative reconstruction technique. CONTRAST:  131m OMNIPAQUE IOHEXOL 300 MG/ML  SOLN COMPARISON:  None Available. FINDINGS: CT CHEST FINDINGS Cardiovascular: Borderline cardiomegaly. Calcified plaque over the left anterior descending coronary artery and right coronary arteries. Calcification just inferior to the aortic root. No evidence of aortic aneurysm or dissection. Moderate calcified plaque over the descending thoracic  aorta. Pulmonary arterial system and remaining vascular structures are unremarkable. Mediastinum/Nodes: Goiter. No mediastinal or hilar adenopathy. Remaining mediastinal structures are unremarkable. Lungs/Pleura: Lungs are adequately inflated. Minimal biapical pleural thickening is present. No focal airspace consolidation or effusion. Mild linear scarring/atelectasis over the lingula and left base. Small irregular bordered nodule over the right middle lobe (image 90, series 4) measuring 6-7 mm (7.8 x 5.7 mm). Airways are normal. Musculoskeletal: Mild loss of height over the mid anterior the T3 vertebral body CT ABDOMEN PELVIS FINDINGS Hepatobiliary: Liver, gallbladder and biliary tree are normal. Pancreas: Normal. Spleen: Normal. Adrenals/Urinary Tract: Adrenal glands are normal. Kidneys are normal in size without hydronephrosis or nephrolithiasis. Ureters and bladder are unremarkable. Stomach/Bowel: Stomach and small bowel are unremarkable. Appendix not visualized. Moderate diverticulosis of the descending and sigmoid colon without active inflammation. Vascular/Lymphatic: Calcified plaque over the abdominal aorta without evidence of aneurysm. No adenopathy. Reproductive: Previous hysterectomy. Other: No free fluid or focal inflammatory change. Mild air over the subcutaneous fat of the right anterior abdominal wall Musculoskeletal: Mild spondylosis of the spine. Minimal degenerative change of the hips and symphysis pubis joint. IMPRESSION: 1. No acute findings in the chest, abdomen or pelvis. 2. 6-7 mm irregular bordered right middle lobe pulmonary nodule. Non-contrast chest CT at 6-12 months is recommended. If the nodule is stable at time of repeat CT, then future CT at 18-24 months (from today's scan) is considered optional for low-risk patients, but is recommended for high-risk patients. This recommendation follows the consensus statement: Guidelines for Management of Incidental Pulmonary Nodules Detected on CT  Images: From the Fleischner Society 2017; Radiology 2017; 284:228-243. 3. Moderate diverticulosis of the descending and sigmoid colon without active inflammation. 4. Goiter. 5. Mild loss of height over the mid anterior the T3 vertebral body, age indeterminate. 6. Aortic atherosclerosis.  Atherosclerotic coronary artery disease. Aortic Atherosclerosis (ICD10-I70.0). Electronically Signed: By: DMarin OlpM.D. On: 01/19/2022 14:47    Assessment/Plan: 6 Days Post-Op   Principal Problem:   S/P total knee arthroplasty, left Active Problems:   HLD (hyperlipidemia)   Hypocalcemia   Hypokalemia   Normocytic anemia   OP (osteoporosis)   Hyponatremia   Acute hyponatremia   Lesion of right lung  Patient sodium today is 130.  Patient can be discharged today per Dr. AOuida Sillshospitalist.  She will follow-up with her PCP for monitoring her sodium levels and follow-up on her PET scan.  Hyponatremia was felt likely due to SIADH.  Patient be discharged on Lovenox 40 mg daily for DVT prophylaxis.  I feel the 30 mg twice daily of Lovenox has contributed to her ecchymosis therefore Lovenox will be used only once a day.  Patient will follow-up with me in the office on 01/29/2022 at 11:30 AM.  Patient will be discharged with thigh-high Ted hose to reduce left knee swelling.    KThornton Park, MD 01/20/2022, 12:58 PM

## 2022-01-20 NOTE — Progress Notes (Signed)
Occupational Therapy Treatment Patient Details Name: Sheryl Miller MRN: [id number] DOB: 1935-05-09 Today's Date: 01/20/2022   History of present illness 86 y/o female s/p L TKA 11/7.   OT comments  Pt seen for OT tx, agreeable but declining out of recliner/ADL mobility 2/2 just having gotten to recliner from PT session, per pt report. OT facilitated problem solving for ADL with modifications and AE/DME as well as problem solving for caregiving role with spouse and plans for IADL tasks such as groceries, meals, etc. Pt verbalized understanding, denies additional questions/concerns at this time. Continues to benefit from skilled OT services to maximize return to PLOF.    Recommendations for follow up therapy are one component of a multi-disciplinary discharge planning process, led by the attending physician.  Recommendations may be updated based on patient status, additional functional criteria and insurance authorization.    Follow Up Recommendations  Follow physician's recommendations for discharge plan and follow up therapies     Assistance Recommended at Discharge Intermittent Supervision/Assistance  Patient can return home with the following  A little help with walking and/or transfers;A little help with bathing/dressing/bathroom;Help with stairs or ramp for entrance;Assist for transportation;Assistance with cooking/housework   Equipment Recommendations  None recommended by OT    Recommendations for Other Services      Precautions / Restrictions Precautions Precautions: Fall Required Braces or Orthoses: Knee Immobilizer - Left Knee Immobilizer - Left: Other (comment) (in bed) Restrictions Weight Bearing Restrictions: Yes LLE Weight Bearing: Weight bearing as tolerated       Mobility Bed Mobility               General bed mobility comments: in recliner on arrival    Transfers                   General transfer comment: pt declined, reports just back  from walking with PT     Balance                                           ADL either performed or assessed with clinical judgement   ADL                                              Extremity/Trunk Assessment              Vision       Perception     Praxis      Cognition Arousal/Alertness: Awake/alert Behavior During Therapy: WFL for tasks assessed/performed Overall Cognitive Status: Within Functional Limits for tasks assessed                                          Exercises Other Exercises Other Exercises: OT facilitated problem solving for ADL with modifications and AE/DME as well as problem solving for caregiving role with spouse and plans for IADL tasks such as groceries, meals, etc.    Shoulder Instructions       General Comments      Pertinent Vitals/ Pain       Pain Assessment Pain Assessment: 0-10 Pain Score: 6  Pain Location: L knee Pain Descriptors / Indicators: Discomfort,  Aching Pain Intervention(s): Limited activity within patient's tolerance, Monitored during session, Premedicated before session, Repositioned, Ice applied  Home Living                                          Prior Functioning/Environment              Frequency  Min 2X/week        Progress Toward Goals  OT Goals(current goals can now be found in the care plan section)  Progress towards OT goals: Progressing toward goals  Acute Rehab OT Goals Patient Stated Goal: to return home OT Goal Formulation: With patient Time For Goal Achievement: 01/29/22 Potential to Achieve Goals: Good  Plan Discharge plan remains appropriate;Frequency remains appropriate    Co-evaluation                 AM-PAC OT "6 Clicks" Daily Activity     Outcome Measure   Help from another person eating meals?: None Help from another person taking care of personal grooming?: A Little Help from another  person toileting, which includes using toliet, bedpan, or urinal?: A Little Help from another person bathing (including washing, rinsing, drying)?: A Little Help from another person to put on and taking off regular upper body clothing?: None Help from another person to put on and taking off regular lower body clothing?: A Little 6 Click Score: 20    End of Session    OT Visit Diagnosis: Muscle weakness (generalized) (M62.81);Dizziness and giddiness (R42)   Activity Tolerance Patient tolerated treatment well   Patient Left in chair;with call bell/phone within reach;with chair alarm set   Nurse Communication          Time: [phone] OT Time Calculation (min): 10 min  Charges: OT General Charges $OT Visit: 1 Visit OT Treatments $Self Care/Home Management : 8-22 mins  Ardeth Perfect., MPH, MS, OTR/L ascom [phone] 01/20/22, 10:21 AM

## 2022-02-03 ENCOUNTER — Other Ambulatory Visit: Payer: Self-pay | Admitting: Internal Medicine

## 2022-02-03 ENCOUNTER — Encounter: Payer: Self-pay | Admitting: Internal Medicine

## 2022-02-03 DIAGNOSIS — R911 Solitary pulmonary nodule: Secondary | ICD-10-CM

## 2022-02-13 ENCOUNTER — Ambulatory Visit: Admission: RE | Admit: 2022-02-13 | Payer: Medicare HMO | Source: Ambulatory Visit

## 2022-02-17 ENCOUNTER — Ambulatory Visit: Payer: Medicare HMO | Admitting: Dermatology

## 2022-02-26 ENCOUNTER — Other Ambulatory Visit: Payer: Self-pay | Admitting: Dermatology

## 2022-03-20 ENCOUNTER — Other Ambulatory Visit: Payer: Self-pay | Admitting: Internal Medicine

## 2022-03-20 DIAGNOSIS — R911 Solitary pulmonary nodule: Secondary | ICD-10-CM

## 2022-03-28 ENCOUNTER — Other Ambulatory Visit: Payer: Self-pay | Admitting: Dermatology

## 2022-03-28 DIAGNOSIS — L219 Seborrheic dermatitis, unspecified: Secondary | ICD-10-CM

## 2022-04-24 ENCOUNTER — Ambulatory Visit
Admission: RE | Admit: 2022-04-24 | Discharge: 2022-04-24 | Disposition: A | Discharge status: Home / Self Care | Payer: Medicare HMO | Source: Ambulatory Visit | Attending: Internal Medicine | Admitting: Internal Medicine

## 2022-04-24 DIAGNOSIS — R911 Solitary pulmonary nodule: Secondary | ICD-10-CM

## 2022-10-24 ENCOUNTER — Other Ambulatory Visit: Payer: Self-pay | Admitting: Internal Medicine

## 2022-10-24 DIAGNOSIS — R911 Solitary pulmonary nodule: Secondary | ICD-10-CM

## 2022-10-30 ENCOUNTER — Other Ambulatory Visit: Payer: Self-pay | Admitting: Internal Medicine

## 2022-10-30 DIAGNOSIS — Z1231 Encounter for screening mammogram for malignant neoplasm of breast: Secondary | ICD-10-CM

## 2022-11-04 ENCOUNTER — Ambulatory Visit
Admission: RE | Admit: 2022-11-04 | Discharge: 2022-11-04 | Disposition: A | Discharge status: Home / Self Care | Payer: Medicare HMO | Source: Ambulatory Visit | Attending: Internal Medicine | Admitting: Internal Medicine

## 2022-11-04 DIAGNOSIS — R911 Solitary pulmonary nodule: Secondary | ICD-10-CM | POA: Insufficient documentation

## 2022-11-11 ENCOUNTER — Other Ambulatory Visit: Payer: Self-pay | Admitting: Internal Medicine

## 2022-11-11 DIAGNOSIS — N644 Mastodynia: Secondary | ICD-10-CM

## 2022-11-11 DIAGNOSIS — R2232 Localized swelling, mass and lump, left upper limb: Secondary | ICD-10-CM

## 2022-11-20 ENCOUNTER — Ambulatory Visit
Admission: RE | Admit: 2022-11-20 | Discharge: 2022-11-20 | Disposition: A | Discharge status: Home / Self Care | Payer: Medicare HMO | Source: Ambulatory Visit | Attending: Internal Medicine | Admitting: Internal Medicine

## 2022-11-20 DIAGNOSIS — N644 Mastodynia: Secondary | ICD-10-CM

## 2022-11-20 DIAGNOSIS — R2232 Localized swelling, mass and lump, left upper limb: Secondary | ICD-10-CM

## 2023-02-12 ENCOUNTER — Telehealth: Payer: Self-pay | Admitting: Internal Medicine

## 2023-02-12 NOTE — Telephone Encounter (Signed)
Patient contacted the office to request an appointment with Dr. Sharen Hones. States that she used to see Dr. Alphonsus Sias and has since been seeing another provider. Patient says that her insurance is now out of network with the provider she currently sees. Patient wanted to know if she could move forward with establishing care with Dr. Reece Agar since Dr. Alphonsus Sias will be retiring next year. Please advise

## 2023-02-13 NOTE — Telephone Encounter (Deleted)
Ok to reestablish care.

## 2023-02-16 NOTE — Telephone Encounter (Signed)
I am closed to new patients. Would offer appt with a provider who is seeing new patients

## 2023-02-18 NOTE — Telephone Encounter (Signed)
Spoke to pt, told pt Dr. Timoteo Expose response. No further questions.

## 2023-02-18 NOTE — Telephone Encounter (Signed)
Called patient and VM full, to relay message below to patient.

## 2023-06-02 ENCOUNTER — Other Ambulatory Visit: Payer: Self-pay | Admitting: Internal Medicine

## 2023-06-02 DIAGNOSIS — R0989 Other specified symptoms and signs involving the circulatory and respiratory systems: Secondary | ICD-10-CM

## 2023-06-09 ENCOUNTER — Ambulatory Visit
Admission: RE | Admit: 2023-06-09 | Discharge: 2023-06-09 | Disposition: A | Discharge status: Home / Self Care | Source: Ambulatory Visit | Attending: Internal Medicine | Admitting: Internal Medicine

## 2023-06-09 ENCOUNTER — Ambulatory Visit
Admission: RE | Admit: 2023-06-09 | Discharge: 2023-06-09 | Disposition: A | Discharge status: Home / Self Care | Source: Ambulatory Visit | Attending: Internal Medicine

## 2023-06-09 DIAGNOSIS — R0989 Other specified symptoms and signs involving the circulatory and respiratory systems: Secondary | ICD-10-CM | POA: Diagnosis present

## 2023-09-14 ENCOUNTER — Ambulatory Visit (INDEPENDENT_AMBULATORY_CARE_PROVIDER_SITE_OTHER): Admitting: Dermatology

## 2023-09-14 DIAGNOSIS — L609 Nail disorder, unspecified: Secondary | ICD-10-CM

## 2023-09-14 DIAGNOSIS — L814 Other melanin hyperpigmentation: Secondary | ICD-10-CM

## 2023-09-14 DIAGNOSIS — R202 Paresthesia of skin: Secondary | ICD-10-CM

## 2023-09-14 DIAGNOSIS — W908XXA Exposure to other nonionizing radiation, initial encounter: Secondary | ICD-10-CM

## 2023-09-14 DIAGNOSIS — D1801 Hemangioma of skin and subcutaneous tissue: Secondary | ICD-10-CM

## 2023-09-14 DIAGNOSIS — L578 Other skin changes due to chronic exposure to nonionizing radiation: Secondary | ICD-10-CM | POA: Diagnosis not present

## 2023-09-14 DIAGNOSIS — L821 Other seborrheic keratosis: Secondary | ICD-10-CM

## 2023-09-14 DIAGNOSIS — L820 Inflamed seborrheic keratosis: Secondary | ICD-10-CM

## 2023-09-14 DIAGNOSIS — L300 Nummular dermatitis: Secondary | ICD-10-CM

## 2023-09-14 DIAGNOSIS — D2239 Melanocytic nevi of other parts of face: Secondary | ICD-10-CM

## 2023-09-14 DIAGNOSIS — L720 Epidermal cyst: Secondary | ICD-10-CM

## 2023-09-14 DIAGNOSIS — L738 Other specified follicular disorders: Secondary | ICD-10-CM

## 2023-09-14 DIAGNOSIS — L219 Seborrheic dermatitis, unspecified: Secondary | ICD-10-CM

## 2023-09-14 DIAGNOSIS — Z1283 Encounter for screening for malignant neoplasm of skin: Secondary | ICD-10-CM

## 2023-09-14 DIAGNOSIS — D229 Melanocytic nevi, unspecified: Secondary | ICD-10-CM

## 2023-09-14 NOTE — Progress Notes (Signed)
 Follow-Up Visit   Subjective  Sheryl Miller is a 88 y.o. female who presents for the following: Skin Cancer Screening and Upper Body Skin Exam hx of aks, isks, seb derm has used ketoconazole  shampoo and mometasone  solution in past but not currently using any treatments. Feels scalp is ok and doesn't want refills.  Hx of nummular dermatitis at back has used tmc 0.1 cream in the past as needed for flares    Irritated spot at upper left arm, spot at lower chin area, area on back that occasionally bothers her   The patient presents for Upper Body Skin Exam (UBSE) for skin cancer screening and mole check. The patient has spots, moles and lesions to be evaluated, some may be new or changing and the patient may have concern these could be cancer.    The following portions of the chart were reviewed this encounter and updated as appropriate: medications, allergies, medical history  Review of Systems:  No other skin or systemic complaints except as noted in HPI or Assessment and Plan.  Objective  Well appearing patient in no apparent distress; mood and affect are within normal limits.  All skin waist up examined. Relevant physical exam findings are noted in the Assessment and Plan.  Left Upper Arm - Posterior x 1 Erythematous stuck-on, waxy papule or plaque  Assessment & Plan   INFLAMED SEBORRHEIC KERATOSIS Left Upper Arm - Posterior x 1 Symptomatic, irritating, patient would like treated. Destruction of lesion - Left Upper Arm - Posterior x 1  Destruction method: cryotherapy   Informed consent: discussed and consent obtained   Lesion destroyed using liquid nitrogen: Yes   Region frozen until ice ball extended beyond lesion: Yes   Outcome: patient tolerated procedure well with no complications   Post-procedure details: wound care instructions given   Additional details:  Prior to procedure, discussed risks of blister formation, small wound, skin dyspigmentation, or rare scar  following cryotherapy. Recommend Vaseline ointment to treated areas while healing.   Skin cancer screening performed today.  Actinic Damage - Chronic condition, secondary to cumulative UV/sun exposure - diffuse scaly erythematous macules with underlying dyspigmentation - Recommend daily broad spectrum sunscreen SPF 30+ to sun-exposed areas, reapply every 2 hours as needed.  - Staying in the shade or wearing long sleeves, sun glasses (UVA+UVB protection) and wide brim hats (4-inch brim around the entire circumference of the hat) are also recommended for sun protection.  - Call for new or changing lesions.  Lentigines, Seborrheic Keratoses, Hemangiomas - Benign normal skin lesions - Benign-appearing - Call for any changes Sks at back  Melanocytic Nevi - Tan-brown and/or pink-flesh-colored symmetric macules and papules - R mid cheek  4.0 mm flesh papule (vs SK) - Benign appearing on exam today, Stable. - Observation - Call clinic for new or changing moles - Recommend daily use of broad spectrum spf 30+ sunscreen to sun-exposed areas.    Seborrheic keratosis vs Sebaceous Hyperplasia   Left Upper Lip  Exam: 4.0 mm flesh white thin papule  See photo from 11/05/2021   Benign-appearing. Stable compared to previous visit. Observation.  Call clinic for new or changing moles.  Recommend daily use of broad spectrum spf 30+ sunscreen to sun-exposed areas.    MILIA Exam: tiny erythematous firm white papules BL perioral area  Discussed this is a type of cyst. Benign-appearing. Sometimes these will clear with OTC adapalene/Differin 0.1% cream QHS or retinol.  Discussed extraction if symptomatic.  Treatment Plan: Symptomatic, irritating, patient  would like treated.   Procedure risks and benefits were discussed with the patient including bruising and verbal consent was obtained. Following prep of the skin of the right and letf perioral  with an alcohol swab, area was injected with 1%  lidocaine /epinephrine , extraction of milia was performed with cotton tip applicators following superficial incision made over their surfaces with a #11 surgical blade. Capillary hemostasis was achieved with 20% aluminum chloride solution. Vaseline ointment was applied to each site. The patient tolerated the procedure well.    Seborrheic dermatitis Exam: Scalp clear today    Chronic condition with duration or expected duration over one year. Currently well-controlled.   Seborrheic Dermatitis  -  is a chronic persistent rash characterized by pinkness and scaling most commonly of the mid face but also can occur on the scalp (dandruff), ears; mid chest, mid back and groin.  It tends to be exacerbated by stress and cooler weather.  People who have neurologic disease may experience new onset or exacerbation of existing seborrheic dermatitis.  The condition is not curable but treatable and can be controlled.     Treatment Plan: Discussed otc treatment if needed  Cerave sample of anti dandruff shampoo and conditioner given   POSSIBLE NOTALGIA PARESTHETICA At left mid to lower back Exam:Clear today, pt states currently not flared Chronic condition without cure secondary to pinched nerve along spine causing itching or sensation changes in an area of skin. Chronic rubbing or scratching causes darkening of the skin.  OTC treatments which can help with itch include numbing creams like pramoxine or lidocaine  which temporarily reduce itch or Capsaicin-containing creams which cause a burning sensation but which sometimes over time will reset the nerves to stop producing itch.  If you choose to use Capsaicin cream, it is recommended to use it 5 times daily for 1 week followed by 3 times daily for 3-6 weeks. You may have to continue using it long-term.  If not doing well with OTC options, could consider [facility] compounded prescription anti-itch cream with Amitriptyline  5% / Lidocaine  5% / Pramoxine 1%  or Amitriptyline  5% / Gabapentin  10% / Lidocaine  5% Cream or other prescription cream or pill options.   Patient will send mychart message if rash appears when flared   Nummular dermatitis Exam: back clear at exam    Chronic condition with duration or expected duration over one year. Currently well-controlled.  Nummular dermatitis (eczema) is a chronic, relapsing, pruritic condition that can significantly affect quality of life. It is often associated with dry skin can require treatment with prescription topical medications, in addition to dry skin care.  If there is underlying atopic dermatitis and topicals are not working, then biologic injections may be necessary to control symptoms.  Continue mild soap and moisturizing cream 1-2 times daily.  Gentle skin care handout provided.    Not needing topical RX [facility]) at this time.    Nail Problem- Brittle Nails  Exam: dry brittle nails with vertical ridging and xerosis  Treatment Plan: Recommend applying OTC DermaNail conditioner to cuticle area of fingernails twice daily to help with chipping/breaking.  May take 3-6 months of regular use to get best result.  Could also add OTC Biotin supplement 2.5 mg daily to help strengthen nails.  Recommend mild soap with handwashing followed by a thick moisturizing cream to fingernails.  May use Cutemol cream,which comes with DermaNail, Neutrogena [place] hand cream, or Eucerin cream original.    Return in about 1 year (around 09/13/2024) for TBSE.  I, Eleanor Blush, CMA, am acting as scribe for Rexene Rattler, MD.   Documentation: I have reviewed the above documentation for accuracy and completeness, and I agree with the above.  Rexene Rattler, MD

## 2023-09-14 NOTE — Patient Instructions (Addendum)
 For dry brittle nails    Recommend applying OTC DermaNail conditioner to cuticle area of fingernails twice daily to help with chipping/breaking.  May take 3-6 months of regular use to get best result.  Could also add OTC Biotin supplement 2.5 mg daily to help strengthen nails.  Recommend mild soap with handwashing followed by a thick moisturizing cream to fingernails.  May use Cutemol cream,which comes with DermaNail, Neutrogena [place] hand cream, or Eucerin cream original.   Gentle Skin Care Guide  1. Bathe no more than once a day.  2. Avoid bathing in hot water  3. Use a mild soap like Dove, Vanicream, Cetaphil, CeraVe. Can use Lever 2000 or Cetaphil antibacterial soap  4. Use soap only where you need it. On most days, use it under your arms, between your legs, and on your feet. Let the water rinse other areas unless visibly dirty.  5. When you get out of the bath/shower, use a towel to gently blot your skin dry, don't rub it.  6. While your skin is still a little damp, apply a moisturizing cream such as Vanicream, CeraVe, Cetaphil, Eucerin, Sarna lotion or plain Vaseline Jelly. For hands apply Neutrogena [place] Hand Cream or Excipial Hand Cream.  7. Reapply moisturizer any time you start to itch or feel dry.  8. Sometimes using free and clear laundry detergents can be helpful. Fabric softener sheets should be avoided. Downy Free & Gentle liquid, or any liquid fabric softener that is free of dyes and perfumes, it acceptable to use  9. If your doctor has given you prescription creams you may apply moisturizers over them       Seborrheic Keratosis  What causes seborrheic keratoses? Seborrheic keratoses are harmless, common skin growths that first appear during adult life.  As time goes by, more growths appear.  Some people may develop a large number of them.  Seborrheic keratoses appear on both covered and uncovered body parts.  They are not caused by sunlight.  The tendency  to develop seborrheic keratoses can be inherited.  They vary in color from skin-colored to gray, brown, or even black.  They can be either smooth or have a rough, warty surface.   Seborrheic keratoses are superficial and look as if they were stuck on the skin.  Under the microscope this type of keratosis looks like layers upon layers of skin.  That is why at times the top layer may seem to fall off, but the rest of the growth remains and re-grows.    Treatment Seborrheic keratoses do not need to be treated, but can easily be removed in the office.  Seborrheic keratoses often cause symptoms when they rub on clothing or jewelry.  Lesions can be in the way of shaving.  If they become inflamed, they can cause itching, soreness, or burning.  Removal of a seborrheic keratosis can be accomplished by freezing, burning, or surgery. If any spot bleeds, scabs, or grows rapidly, please return to have it checked, as these can be an indication of a skin cancer.  Cryotherapy Aftercare  Wash gently with soap and water everyday.   Apply Vaseline and Band-Aid daily until healed.     Melanoma ABCDEs  Melanoma is the most dangerous type of skin cancer, and is the leading cause of death from skin disease.  You are more likely to develop melanoma if you: Have light-colored skin, light-colored eyes, or red or blond hair Spend a lot of time in the sun Tan regularly, either  outdoors or in a tanning bed Have had blistering sunburns, especially during childhood Have a close family member who has had a melanoma Have atypical moles or large birthmarks  Early detection of melanoma is key since treatment is typically straightforward and cure rates are extremely high if we catch it early.   The first sign of melanoma is often a change in a mole or a new dark spot.  The ABCDE system is a way of remembering the signs of melanoma.  A for asymmetry:  The two halves do not match. B for border:  The edges of the growth are  irregular. C for color:  A mixture of colors are present instead of an even brown color. D for diameter:  Melanomas are usually (but not always) greater than 6mm - the size of a pencil eraser. E for evolution:  The spot keeps changing in size, shape, and color.  Please check your skin once per month between visits. You can use a small mirror in front and a large mirror behind you to keep an eye on the back side or your body.   If you see any new or changing lesions before your next follow-up, please call to schedule a visit.  Please continue daily skin protection including broad spectrum sunscreen SPF 30+ to sun-exposed areas, reapplying every 2 hours as needed when you're outdoors.   Staying in the shade or wearing long sleeves, sun glasses (UVA+UVB protection) and wide brim hats (4-inch brim around the entire circumference of the hat) are also recommended for sun protection.     Due to recent changes in healthcare laws, you may see results of your pathology and/or laboratory studies on MyChart before the doctors have had a chance to review them. We understand that in some cases there may be results that are confusing or concerning to you. Please understand that not all results are received at the same time and often the doctors may need to interpret multiple results in order to provide you with the best plan of care or course of treatment. Therefore, we ask that you please give us  2 business days to thoroughly review all your results before contacting the office for clarification. Should we see a critical lab result, you will be contacted sooner.   If You Need Anything After Your Visit  If you have any questions or concerns for your doctor, please call our main line at [phone] and press option 4 to reach your doctor's medical assistant. If no one answers, please leave a voicemail as directed and we will return your call as soon as possible. Messages left after 4 pm will be answered the  following business day.   You may also send us  a message via MyChart. We typically respond to MyChart messages within 1-2 business days.  For prescription refills, please ask your pharmacy to contact our office. Our fax number is [phone].  If you have an urgent issue when the clinic is closed that cannot wait until the next business day, you can page your doctor at the number below.    Please note that while we do our best to be available for urgent issues outside of office hours, we are not available 24/7.   If you have an urgent issue and are unable to reach us , you may choose to seek medical care at your doctor's office, retail clinic, urgent care center, or emergency room.  If you have a medical emergency, please immediately call 911 or go to  the emergency department.  Pager Numbers  - Dr. Hester: [phone]  - Dr. Jackquline: [phone]  - Dr. Claudene: [phone]   In the event of inclement weather, please call our main line at [phone] for an update on the status of any delays or closures.  Dermatology Medication Tips: Please keep the boxes that topical medications come in in order to help keep track of the instructions about where and how to use these. Pharmacies typically print the medication instructions only on the boxes and not directly on the medication tubes.   If your medication is too expensive, please contact our office at [phone] option 4 or send us  a message through MyChart.   We are unable to tell what your co-pay for medications will be in advance as this is different depending on your insurance coverage. However, we may be able to find a substitute medication at lower cost or fill out paperwork to get insurance to cover a needed medication.   If a prior authorization is required to get your medication covered by your insurance company, please allow us  1-2 business days to complete this process.  Drug prices often vary depending on where the  prescription is filled and some pharmacies may offer cheaper prices.  The website www.goodrx.com contains coupons for medications through different pharmacies. The prices here do not account for what the cost may be with help from insurance (it may be cheaper with your insurance), but the website can give you the price if you did not use any insurance.  - You can print the associated coupon and take it with your prescription to the pharmacy.  - You may also stop by our office during regular business hours and pick up a GoodRx coupon card.  - If you need your prescription sent electronically to a different pharmacy, notify our office through [facility] or by phone at [phone] option 4.     Si Usted Necesita Algo Despus de Su Visita  Tambin puede enviarnos un mensaje a travs de Clinical cytogeneticist. Por lo general respondemos a los mensajes de MyChart en el transcurso de 1 a 2 das hbiles.  Para renovar recetas, por favor pida a su farmacia que se ponga en contacto con nuestra oficina. Randi lakes de fax es [place] [phone].  Si tiene un asunto urgente cuando la clnica est cerrada y que no puede esperar hasta el siguiente da hbil, puede llamar/localizar a su doctor(a) al nmero que aparece a continuacin.   Por favor, tenga en cuenta que aunque hacemos todo lo posible para estar disponibles para asuntos urgentes fuera del horario de [place], no estamos disponibles las 24 horas del da, los 7 [address]  [address] de la [place].   Si tiene un problema urgente y no puede comunicarse con nosotros, puede optar por buscar atencin mdica  en el consultorio de su doctor(a), en una clnica privada, en un centro de atencin urgente o en una sala de emergencias.  Si tiene Engineer, drilling, por favor llame inmediatamente al 911 o vaya a la sala de emergencias.  Nmeros de bper  - Dr. Hester: [phone]  - Dra. Jackquline: [phone]  - Dr. Claudene: [phone]   En caso de inclemencias del  tiempo, por favor llame a landry capes principal al [phone] para una actualizacin sobre el [place] de cualquier retraso o cierre.  Consejos para la medicacin en dermatologa: Por favor, guarde las cajas en las que vienen los medicamentos de uso tpico para ayudarle a seguir las instrucciones sobre dnde y  cmo usarlos. Las farmacias generalmente imprimen las instrucciones del medicamento slo en las cajas y no directamente en los tubos del [place].   Si su medicamento es muy caro, por favor, pngase en contacto con landry rieger llamando al [phone] y presione la opcin 4 o envenos un mensaje a travs de Clinical cytogeneticist.   No podemos decirle cul ser su copago por los medicamentos por adelantado ya que esto es diferente dependiendo de la cobertura de su seguro. Sin embargo, es posible que podamos encontrar un medicamento sustituto a Audiological scientist un formulario para que el seguro cubra el medicamento que se considera necesario.   Si se requiere una autorizacin previa para que su compaa de seguros malta su medicamento, por favor permtanos de 1 a 2 das hbiles para completar este proceso.  Los precios de los medicamentos varan con frecuencia dependiendo del Environmental consultant de dnde se surte la receta y alguna farmacias pueden ofrecer precios ms baratos.  El sitio web www.goodrx.com tiene cupones para medicamentos de Health and safety inspector. Los precios aqu no tienen en cuenta lo que podra costar con la ayuda del seguro (puede ser ms barato con su seguro), pero el sitio web puede darle el precio si no utiliz Tourist information centre manager.  - Puede imprimir el cupn correspondiente y llevarlo con su receta a la farmacia.  - Tambin puede pasar por nuestra oficina durante el horario de atencin regular y Education officer, museum una tarjeta de cupones de GoodRx.  - Si necesita que su receta se enve electrnicamente a una farmacia diferente, informe a nuestra oficina a travs de MyChart de [place] o por telfono  llamando al [phone] y presione la opcin 4.

## 2023-10-03 ENCOUNTER — Ambulatory Visit: Payer: Self-pay | Admitting: Physician Assistant

## 2023-10-03 ENCOUNTER — Ambulatory Visit: Admission: EM | Admit: 2023-10-03 | Discharge: 2023-10-03 | Disposition: A | Discharge status: Home / Self Care

## 2023-10-03 ENCOUNTER — Ambulatory Visit (INDEPENDENT_AMBULATORY_CARE_PROVIDER_SITE_OTHER)

## 2023-10-03 DIAGNOSIS — J40 Bronchitis, not specified as acute or chronic: Secondary | ICD-10-CM | POA: Diagnosis not present

## 2023-10-03 DIAGNOSIS — J329 Chronic sinusitis, unspecified: Secondary | ICD-10-CM

## 2023-10-03 DIAGNOSIS — R051 Acute cough: Secondary | ICD-10-CM

## 2023-10-03 MED ORDER — FLUTICASONE PROPIONATE 50 MCG/ACT NA SUSP: 1.0000 | Freq: Every day | NASAL | 0 refills | Status: AC

## 2023-10-03 MED ORDER — BENZONATATE 100 MG PO CAPS: 100.0000 mg | ORAL_CAPSULE | Freq: Three times a day (TID) | ORAL | 0 refills | Status: AC

## 2023-10-03 MED ORDER — DOXYCYCLINE HYCLATE 100 MG PO CAPS: 100.0000 mg | ORAL_CAPSULE | Freq: Two times a day (BID) | ORAL | 0 refills | Status: AC

## 2023-10-03 NOTE — Discharge Instructions (Signed)
 I will contact you if the radiologist sees something else on your x-ray that changes our treatment plan.  In the meantime, we are treating for a sinus/bronchitis infection.  Start doxycycline  100 mg twice daily for 10 days.  Stay out of the sun while on this medication.  Use Tessalon  for cough.  Continue your Allegra as previously recommended and add fluticasone  nasal spray to help with the allergies which I think might be contributing to your symptoms.  I recommend nasal saline/sinus rinses as well as a humidifier in your room.  If your symptoms are not improving within a week or if anything worsens and you have high fever, worsening cough, shortness of breath, chest pain you need to be seen immediately.

## 2023-10-03 NOTE — ED Triage Notes (Signed)
 Patient to Urgent Care with complaints of persistent and dry cough. Waking her up during the night. Denies any fevers.   Symptoms x2 weeks.   Taking otc cough medication/ tylenol / throat spray.

## 2023-10-03 NOTE — ED Provider Notes (Signed)
 Sheryl Miller    CSN: [id number] Arrival date & time: 10/03/23  1249      History   Chief Complaint Chief Complaint  Patient presents with   Cough    HPI Sheryl Miller is a 88 y.o. female.   Patient presents today with a 2-week history of cough.  She reports associated postnasal drainage and sore throat.  The cough is severe and kept her up last night prompting evaluation today.  She denies any fever, chest pain, shortness of breath, nausea, vomiting.  She has tried throat spray, cough medication, Tylenol  without improvement of symptoms.  She denies any known sick contacts.  She does have a history of seasonal allergies managed with Allegra but despite this medication she continues to have symptoms.  She denies any history of asthma, COPD, smoking.  Denies history of diabetes.  Denies any recent antibiotics or steroids.  She is anxious to feel better as her symptoms are preventing her from sleeping.    Past Medical History:  Diagnosis Date   Allergic rhinitis, cause unspecified    Breast cyst    Diverticulosis of colon (without mention of hemorrhage)    Esophageal reflux    Female stress incontinence    Internal hemorrhoids without mention of complication    Osteoporosis, unspecified    Other and unspecified hyperlipidemia    Raynaud's syndrome    Rheumatic fever    Unspecified constipation     Patient Active Problem List   Diagnosis Date Noted   Lesion of right lung 01/20/2022   Acute hyponatremia 01/17/2022   Hyponatremia 01/16/2022   HLD (hyperlipidemia) 01/15/2022   Hypocalcemia 01/15/2022   Hypokalemia 01/15/2022   Normocytic anemia 01/15/2022   OP (osteoporosis) 01/15/2022   S/P total knee arthroplasty, left 01/14/2022   Lumbar back pain 07/15/2021   Neuropathy 10/29/2018   Generalized osteoarthritis 07/10/2017   Advance directive discussed with patient 02/13/2016   Family history of breast cancer 07/20/2015   Hyperlipemia 02/12/2015   Sleep  disturbance 04/20/2014   Balance problem 01/26/2013   Routine general medical examination at a health care facility 08/16/2010   Pes planus 02/11/2010   UNEQUAL LEG LENGTH 02/11/2010   VARICOSE VEINS, LOWER EXTREMITIES 12/01/2007   Osteoporosis 06/24/2007   Internal hemorrhoids 06/07/2007   Chronic constipation 06/07/2007   GERD 02/11/2007   Allergic rhinitis due to pollen 07/09/2006    Past Surgical History:  Procedure Laterality Date   ABDOMINAL HYSTERECTOMY     BLADDER REPAIR     BREAST BIOPSY Right 2000   fibrocystic disease   KNEE ARTHROSCOPY Right ~5/16   ROTATOR CUFF REPAIR  2006   SHOULDER ARTHROSCOPY WITH ROTATOR CUFF REPAIR AND SUBACROMIAL DECOMPRESSION  2003   Sheryl Miller   TONSILLECTOMY     TONSILLECTOMY     TOTAL KNEE ARTHROPLASTY Left 01/14/2022   Procedure: TOTAL KNEE ARTHROPLASTY;  Surgeon: Sheryl Drivers, Sheryl Miller;  Location: [facility];  Service: Orthopedics;  Laterality: Left;   TUBAL LIGATION      OB History     Gravida  4   Para  4   Term      Preterm      AB      Living  4      SAB      IAB      Ectopic      Multiple      Live Births           Obstetric Comments  1st  Menstrual Cycle: 14 1st Pregnancy: 23          Home Medications    Prior to Admission medications   Medication Sig Start Date End Date Taking? Authorizing Provider  benzonatate  (TESSALON ) 100 MG capsule Take 1 capsule (100 mg total) by mouth every 8 (eight) hours. 10/03/23  Yes Sheryl Miller  doxycycline  (VIBRAMYCIN ) 100 MG capsule Take 1 capsule (100 mg total) by mouth 2 (two) times daily. 10/03/23  Yes Sheryl Miller  fluticasone  (FLONASE ) 50 MCG/ACT nasal spray Place 1 spray into both nostrils daily. 10/03/23  Yes Sheryl Miller  Ascorbic Acid (VITAMIN C) 1000 MG tablet Take 1,000 mg by mouth daily.    Provider, Historical, Sheryl Miller  brimonidine  (ALPHAGAN ) 0.2 % ophthalmic solution Place 1 drop into both eyes 2 (two) times daily. 08/03/19   Provider,  Historical, Sheryl Miller  diclofenac  Sodium (VOLTAREN ) 1 % GEL Apply 2 g topically daily as needed.    Provider, Historical, Sheryl Miller  enoxaparin  (LOVENOX ) 40 MG/0.4ML injection Inject 0.4 mLs (40 mg total) into the skin daily for 14 doses. Patient not taking: Reported on 10/03/2023 01/20/22 02/03/22  Sheryl Miller, Kevin, Sheryl Miller  fexofenadine (ALLEGRA) 180 MG tablet Take 180 mg by mouth daily.    Provider, Historical, Sheryl Miller  gabapentin  (NEURONTIN ) 100 MG capsule Take 100 mg by mouth daily as needed.    Provider, Historical, Sheryl Miller  ketoconazole  (NIZORAL ) 2 % shampoo MASSAGE INTO SCALP EVERY OTHER DAY, LET SIT SEVERAL MINUTES BEFORE RINSING. 03/31/22   Sheryl Miller  melatonin 5 MG TABS Take 5 mg by mouth at bedtime.    Provider, Historical, Sheryl Miller  mometasone  (ELOCON ) 0.1 % lotion APPLY TO AFFECTED AREAS OF SCALP TWICE DAILY UNTIL IMPROVED AND AS NEEDED FOR RECURRENCE. 01/07/22   Sheryl Miller  Multiple Vitamins-Minerals (CENTRUM SILVER ULTRA WOMENS) TABS Take 1 tablet by mouth daily.     Provider, Historical, Sheryl Miller  Probiotic Product (PROBIOTIC-10 PO) Take 1 capsule by mouth daily.    Provider, Historical, Sheryl Miller  simvastatin  (ZOCOR ) 40 MG tablet TAKE 1 TABLET AT BEDTIME 04/22/21   Sheryl Ade Miller, Sheryl Miller  timolol  (TIMOPTIC ) 0.5 % ophthalmic solution Place 1 drop into both eyes 2 (two) times daily. 07/26/19   Provider, Historical, Sheryl Miller  traMADol  (ULTRAM ) 50 MG tablet Take 1 tablet (50 mg total) by mouth every 6 (six) hours as needed for moderate pain. 01/20/22   Sheryl Miller, Kevin, Sheryl Miller  traZODone  (DESYREL ) 150 MG tablet Take by mouth.    Provider, Historical, Sheryl Miller  triamcinolone  cream (KENALOG ) 0.1 % APPLY TO AFFECTED AREAS OF RASH ON BACK 1 TO 2 TIMES DAILY UNTIL IMPROVED. AVOID FACE, GROIN, AXILLA. 03/31/22   Sheryl Miller  TURMERIC PO Take 500 mg by mouth daily.    Provider, Historical, Sheryl Miller  Ubiquinone (ULTRA COQ10 PO) Take 1 capsule by mouth daily.    Provider, Historical, Sheryl Miller  vitamin D3 (CHOLECALCIFEROL ) 25 MCG tablet Take 1,000  Units by mouth daily.    Provider, Historical, Sheryl Miller    Family History Family History  Problem Relation Age of Onset   Coronary artery disease Mother    Heart disease Mother    Diabetes Mother    COPD Father    Breast cancer Sister        2 (age 29?, age 54?); Gene pos   Diabetes Brother    Breast cancer Maternal Aunt        2   Stroke Paternal Grandmother    Diabetes Brother  Breast cancer Sister    Breast cancer Daughter        ? at age 47   Breast cancer Maternal Aunt    Breast cancer Niece    Breast cancer Niece     Social History Social History   Tobacco Use   Smoking status: Never   Smokeless tobacco: Never  Vaping Use   Vaping status: Never Used  Substance Use Topics   Alcohol use: Yes    Comment: wine of sunday   Drug use: No     Allergies   Patient has no known allergies.   Review of Systems Review of Systems  Constitutional:  Positive for activity change. Negative for appetite change, fatigue and fever.  HENT:  Positive for sore throat. Negative for congestion.   Respiratory:  Positive for cough. Negative for shortness of breath.   Cardiovascular:  Negative for chest pain.  Gastrointestinal:  Negative for abdominal pain, diarrhea, nausea and vomiting.  Musculoskeletal:  Negative for arthralgias and myalgias.  Psychiatric/Behavioral:  Positive for sleep disturbance.      Physical Exam Triage Vital Signs ED Triage Vitals  Encounter Vitals Group     BP 10/03/23 1348 128/66     Girls Systolic BP Percentile --      Girls Diastolic BP Percentile --      Boys Systolic BP Percentile --      Boys Diastolic BP Percentile --      Pulse Rate 10/03/23 1348 72     Resp 10/03/23 1348 19     Temp 10/03/23 1348 97.9 F (36.6 C)     Temp src --      SpO2 10/03/23 1348 98 %     Weight --      Height --      Head Circumference --      Peak Flow --      Pain Score 10/03/23 1345 0     Pain Loc --      Pain Education --      Exclude from Growth Chart  --    No data found.  Updated Vital Signs BP 128/66   Pulse 72   Temp 97.9 F (36.6 C)   Resp 19   SpO2 98%   Visual Acuity Right Eye Distance:   Left Eye Distance:   Bilateral Distance:    Right Eye Near:   Left Eye Near:    Bilateral Near:     Physical Exam Vitals reviewed.  Constitutional:      General: She is awake. She is not in acute distress.    Appearance: Normal appearance. She is well-developed. She is not ill-appearing.     Comments: Very pleasant female appears stated age in no acute distress sitting comfortably in exam room  HENT:     Head: Normocephalic and atraumatic.     Right Ear: Tympanic membrane, ear canal and external ear normal. Tympanic membrane is not erythematous or bulging.     Left Ear: Tympanic membrane, ear canal and external ear normal. Tympanic membrane is not erythematous or bulging.     Nose:     Right Sinus: No maxillary sinus tenderness or frontal sinus tenderness.     Left Sinus: No maxillary sinus tenderness or frontal sinus tenderness.     Mouth/Throat:     Pharynx: Uvula midline. Postnasal drip present. No oropharyngeal exudate or posterior oropharyngeal erythema.  Cardiovascular:     Rate and Rhythm: Normal rate and regular rhythm.  Heart sounds: Normal heart sounds, S1 normal and S2 normal. No murmur heard. Pulmonary:     Effort: Pulmonary effort is normal.     Breath sounds: Examination of the right-lower field reveals decreased breath sounds. Examination of the left-lower field reveals decreased breath sounds. Decreased breath sounds present. No wheezing, rhonchi or rales.  Psychiatric:        Behavior: Behavior is cooperative.      UC Treatments / Results  Labs (all labs ordered are listed, but only abnormal results are displayed) Labs Reviewed - No data to display  EKG   Radiology DG Chest 2 View Result Date: 10/03/2023 CLINICAL DATA:  Cough. EXAM: CHEST - 2 VIEW COMPARISON:  Chest radiograph dated 07/27/2016  FINDINGS: Mild chronic intra coarsening and bronchitic changes. No focal consolidation, pleural effusion, or pneumothorax. The cardiac silhouette is within limits. Atherosclerotic calcification of the aorta. No acute osseous pathology. IMPRESSION: No active cardiopulmonary disease. Electronically Signed   By: Vanetta Chou M.D.   On: 10/03/2023 15:09    Procedures Procedures (including critical care time)  Medications Ordered in UC Medications - No data to display  Initial Impression / Assessment and Plan / UC Course  Miller have reviewed the triage vital signs and the nursing notes.  Pertinent labs & imaging results that were available during my care of the patient were reviewed by me and considered in my medical decision making (see chart for details).     Patient is well-appearing, afebrile, nontoxic, nontachycardic.  Viral testing was deferred as she has been symptomatic for several weeks and this would not change our management.  Chest x-ray was obtained given several weeks of cough that showed no acute cardiopulmonary disease.  Will cover for sinobronchitis given prolonged and worsening symptoms with doxycycline  100 mg twice daily for 10 days.  We discussed that she should avoid prolonged sinus pressure while on this medication due to associated photosensitivity.  She was given Tessalon  to help manage her cough.  She can use over-the-counter medication including Tylenol , Mucinex, fluticasone  nasal spray.  Also recommended nasal saline/sinus rinses as well as a humidifier in her room.  If her symptoms not improving quickly she should follow-up with her primary care.  If anything worsens and she has high fever, worsening cough, shortness of breath, chest pain, nausea/vomiting she needs to be seen immediately.  Strict return precautions given.  Final Clinical Impressions(s) / UC Diagnoses   Final diagnoses:  Acute cough  Sinobronchitis     Discharge Instructions      Miller will contact you  if the radiologist sees something else on your x-ray that changes our treatment plan.  In the meantime, we are treating for a sinus/bronchitis infection.  Start doxycycline  100 mg twice daily for 10 days.  Stay out of the sun while on this medication.  Use Tessalon  for cough.  Continue your Allegra as previously recommended and add fluticasone  nasal spray to help with the allergies which Miller think might be contributing to your symptoms.  Miller recommend nasal saline/sinus rinses as well as a humidifier in your room.  If your symptoms are not improving within a week or if anything worsens and you have high fever, worsening cough, shortness of breath, chest pain you need to be seen immediately.     ED Prescriptions     Medication Sig Dispense Auth. Provider   doxycycline  (VIBRAMYCIN ) 100 MG capsule Take 1 capsule (100 mg total) by mouth 2 (two) times daily. 20 capsule Ananya Mccleese, Rocky  K, Miller   benzonatate  (TESSALON ) 100 MG capsule Take 1 capsule (100 mg total) by mouth every 8 (eight) hours. 21 capsule Manessa Buley K, Miller   fluticasone  (FLONASE ) 50 MCG/ACT nasal spray Place 1 spray into both nostrils daily. 16 g Cranston Koors K, Miller      PDMP not reviewed this encounter.   Sherrell Rocky POUR, Miller 10/03/23 1615

## 2023-11-18 ENCOUNTER — Other Ambulatory Visit: Payer: Self-pay | Admitting: Internal Medicine

## 2023-11-18 DIAGNOSIS — R911 Solitary pulmonary nodule: Secondary | ICD-10-CM

## 2023-11-18 DIAGNOSIS — Z1231 Encounter for screening mammogram for malignant neoplasm of breast: Secondary | ICD-10-CM

## 2023-11-26 ENCOUNTER — Ambulatory Visit
Admission: RE | Admit: 2023-11-26 | Discharge: 2023-11-26 | Disposition: A | Discharge status: Home / Self Care | Source: Ambulatory Visit | Attending: Internal Medicine | Admitting: Internal Medicine

## 2023-11-26 DIAGNOSIS — R911 Solitary pulmonary nodule: Secondary | ICD-10-CM | POA: Insufficient documentation

## 2023-12-02 ENCOUNTER — Ambulatory Visit
Admission: RE | Admit: 2023-12-02 | Discharge: 2023-12-02 | Disposition: A | Discharge status: Home / Self Care | Source: Ambulatory Visit | Attending: Internal Medicine | Admitting: Internal Medicine

## 2023-12-02 DIAGNOSIS — Z1231 Encounter for screening mammogram for malignant neoplasm of breast: Secondary | ICD-10-CM | POA: Diagnosis present

## 2024-09-20 ENCOUNTER — Encounter: Admitting: Dermatology
